# Patient Record
Sex: Male | Born: 1945 | Race: White | Hispanic: No | Marital: Married | Smoking: Light tobacco smoker
Health system: Southern US, Community
[De-identification: ages and names within clinical notes are randomized; demographics above are authoritative.]

## PROBLEM LIST (undated history)

## (undated) DIAGNOSIS — N1330 Unspecified hydronephrosis: Secondary | ICD-10-CM

## (undated) DIAGNOSIS — M545 Low back pain, unspecified: Secondary | ICD-10-CM

## (undated) DIAGNOSIS — J329 Chronic sinusitis, unspecified: Secondary | ICD-10-CM

## (undated) DIAGNOSIS — S12500A Unspecified displaced fracture of sixth cervical vertebra, initial encounter for closed fracture: Secondary | ICD-10-CM

## (undated) DIAGNOSIS — F419 Anxiety disorder, unspecified: Secondary | ICD-10-CM

## (undated) DIAGNOSIS — D751 Secondary polycythemia: Secondary | ICD-10-CM

## (undated) DIAGNOSIS — I700 Atherosclerosis of aorta: Secondary | ICD-10-CM

## (undated) DIAGNOSIS — Z87442 Personal history of urinary calculi: Secondary | ICD-10-CM

## (undated) DIAGNOSIS — N400 Enlarged prostate without lower urinary tract symptoms: Secondary | ICD-10-CM

## (undated) DIAGNOSIS — K219 Gastro-esophageal reflux disease without esophagitis: Secondary | ICD-10-CM

## (undated) DIAGNOSIS — N183 Chronic kidney disease, stage 3 unspecified: Secondary | ICD-10-CM

## (undated) DIAGNOSIS — I10 Essential (primary) hypertension: Secondary | ICD-10-CM

## (undated) DIAGNOSIS — N529 Male erectile dysfunction, unspecified: Secondary | ICD-10-CM

## (undated) DIAGNOSIS — E669 Obesity, unspecified: Secondary | ICD-10-CM

## (undated) DIAGNOSIS — D131 Benign neoplasm of stomach: Secondary | ICD-10-CM

## (undated) DIAGNOSIS — J309 Allergic rhinitis, unspecified: Secondary | ICD-10-CM

## (undated) DIAGNOSIS — M75100 Unspecified rotator cuff tear or rupture of unspecified shoulder, not specified as traumatic: Secondary | ICD-10-CM

## (undated) DIAGNOSIS — E785 Hyperlipidemia, unspecified: Secondary | ICD-10-CM

## (undated) DIAGNOSIS — R42 Dizziness and giddiness: Secondary | ICD-10-CM

## (undated) DIAGNOSIS — K5790 Diverticulosis of intestine, part unspecified, without perforation or abscess without bleeding: Secondary | ICD-10-CM

## (undated) DIAGNOSIS — C801 Malignant (primary) neoplasm, unspecified: Secondary | ICD-10-CM

## (undated) DIAGNOSIS — M542 Cervicalgia: Secondary | ICD-10-CM

## (undated) DIAGNOSIS — D126 Benign neoplasm of colon, unspecified: Secondary | ICD-10-CM

## (undated) DIAGNOSIS — Z8619 Personal history of other infectious and parasitic diseases: Secondary | ICD-10-CM

## (undated) DIAGNOSIS — K4090 Unilateral inguinal hernia, without obstruction or gangrene, not specified as recurrent: Secondary | ICD-10-CM

## (undated) DIAGNOSIS — G4730 Sleep apnea, unspecified: Secondary | ICD-10-CM

## (undated) DIAGNOSIS — K469 Unspecified abdominal hernia without obstruction or gangrene: Secondary | ICD-10-CM

## (undated) DIAGNOSIS — K5792 Diverticulitis of intestine, part unspecified, without perforation or abscess without bleeding: Secondary | ICD-10-CM

## (undated) HISTORY — PX: POLYPECTOMY: SHX149

## (undated) HISTORY — PX: HERNIA REPAIR: SHX51

## (undated) HISTORY — DX: Low back pain: M54.5

## (undated) HISTORY — PX: KNEE SURGERY: SHX244

## (undated) HISTORY — DX: Malignant (primary) neoplasm, unspecified: C80.1

## (undated) HISTORY — DX: Hyperlipidemia, unspecified: E78.5

## (undated) HISTORY — DX: Benign neoplasm of colon, unspecified: D12.6

## (undated) HISTORY — DX: Low back pain, unspecified: M54.50

## (undated) HISTORY — DX: Personal history of other infectious and parasitic diseases: Z86.19

## (undated) HISTORY — DX: Essential (primary) hypertension: I10

## (undated) HISTORY — DX: Benign neoplasm of stomach: D13.1

## (undated) HISTORY — DX: Gastro-esophageal reflux disease without esophagitis: K21.9

## (undated) HISTORY — DX: Diverticulitis of intestine, part unspecified, without perforation or abscess without bleeding: K57.92

## (undated) HISTORY — DX: Unspecified rotator cuff tear or rupture of unspecified shoulder, not specified as traumatic: M75.100

## (undated) HISTORY — DX: Male erectile dysfunction, unspecified: N52.9

## (undated) HISTORY — DX: Benign prostatic hyperplasia without lower urinary tract symptoms: N40.0

## (undated) HISTORY — DX: Sleep apnea, unspecified: G47.30

## (undated) HISTORY — DX: Anxiety disorder, unspecified: F41.9

---

## 1998-03-22 DIAGNOSIS — D751 Secondary polycythemia: Secondary | ICD-10-CM

## 1998-03-22 DIAGNOSIS — C801 Malignant (primary) neoplasm, unspecified: Secondary | ICD-10-CM

## 1998-03-22 HISTORY — DX: Malignant (primary) neoplasm, unspecified: C80.1

## 1998-03-22 HISTORY — DX: Secondary polycythemia: D75.1

## 2005-05-24 ENCOUNTER — Ambulatory Visit: Payer: Self-pay | Admitting: Internal Medicine

## 2005-06-01 ENCOUNTER — Ambulatory Visit: Payer: Self-pay | Admitting: Gastroenterology

## 2005-06-02 ENCOUNTER — Encounter: Admission: RE | Admit: 2005-06-02 | Discharge: 2005-08-31 | Payer: Self-pay | Admitting: Internal Medicine

## 2005-06-08 ENCOUNTER — Ambulatory Visit: Payer: Self-pay | Admitting: Pulmonary Disease

## 2005-06-16 ENCOUNTER — Ambulatory Visit: Payer: Self-pay | Admitting: Gastroenterology

## 2005-06-16 HISTORY — PX: UPPER GI ENDOSCOPY: SHX6162

## 2005-07-22 ENCOUNTER — Ambulatory Visit: Payer: Self-pay | Admitting: Internal Medicine

## 2006-07-21 ENCOUNTER — Ambulatory Visit: Payer: Self-pay | Admitting: Internal Medicine

## 2006-07-21 LAB — CONVERTED CEMR LAB
ALT: 62 units/L — ABNORMAL HIGH (ref 0–40)
AST: 33 units/L (ref 0–37)
BUN: 16 mg/dL (ref 6–23)
Basophils Relative: 1.2 % — ABNORMAL HIGH (ref 0.0–1.0)
Bilirubin Urine: NEGATIVE
Calcium: 9.9 mg/dL (ref 8.4–10.5)
Chloride: 105 meq/L (ref 96–112)
Eosinophils Relative: 1.3 % (ref 0.0–5.0)
HCT: 50.8 % (ref 39.0–52.0)
HDL: 41.3 mg/dL (ref >39.0)
Ketones, ur: NEGATIVE mg/dL
MCV: 90.4 fL (ref 78.0–100.0)
Monocytes Absolute: 0.9 10*3/uL — ABNORMAL HIGH (ref 0.2–0.7)
Monocytes Relative: 9.4 % (ref 3.0–11.0)
Neutro Abs: 5.5 10*3/uL (ref 1.4–7.7)
Neutrophils Relative %: 59.9 % (ref 43.0–77.0)
PSA: 0.61 ng/mL (ref 0.10–4.00)
Potassium: 4.3 meq/L (ref 3.5–5.1)
RBC: 5.62 M/uL (ref 4.22–5.81)
RDW: 12.1 % (ref 11.5–14.6)
TSH: 1.8 microintl units/mL (ref 0.35–5.50)
Total Bilirubin: 1 mg/dL (ref 0.3–1.2)
Total CHOL/HDL Ratio: 4.3
Triglycerides: 230 mg/dL (ref 0–149)
Urine Glucose: NEGATIVE mg/dL
Urobilinogen, UA: 0.2 (ref 0.0–1.0)
WBC: 9.2 10*3/uL (ref 4.5–10.5)
pH: 6 (ref 5.0–8.0)

## 2006-11-17 ENCOUNTER — Encounter: Payer: Self-pay | Admitting: *Deleted

## 2006-11-17 DIAGNOSIS — G43909 Migraine, unspecified, not intractable, without status migrainosus: Secondary | ICD-10-CM | POA: Insufficient documentation

## 2006-11-17 DIAGNOSIS — G4733 Obstructive sleep apnea (adult) (pediatric): Secondary | ICD-10-CM

## 2006-11-17 DIAGNOSIS — I10 Essential (primary) hypertension: Secondary | ICD-10-CM | POA: Insufficient documentation

## 2006-11-17 DIAGNOSIS — F528 Other sexual dysfunction not due to a substance or known physiological condition: Secondary | ICD-10-CM

## 2006-11-17 DIAGNOSIS — K219 Gastro-esophageal reflux disease without esophagitis: Secondary | ICD-10-CM

## 2006-11-17 DIAGNOSIS — E7800 Pure hypercholesterolemia, unspecified: Secondary | ICD-10-CM

## 2006-11-17 DIAGNOSIS — Z8719 Personal history of other diseases of the digestive system: Secondary | ICD-10-CM

## 2006-11-26 DIAGNOSIS — E785 Hyperlipidemia, unspecified: Secondary | ICD-10-CM

## 2006-11-26 DIAGNOSIS — M545 Low back pain: Secondary | ICD-10-CM | POA: Insufficient documentation

## 2007-05-01 ENCOUNTER — Encounter: Payer: Self-pay | Admitting: Internal Medicine

## 2008-04-11 ENCOUNTER — Telehealth (INDEPENDENT_AMBULATORY_CARE_PROVIDER_SITE_OTHER): Payer: Self-pay | Admitting: *Deleted

## 2008-04-12 ENCOUNTER — Ambulatory Visit: Payer: Self-pay | Admitting: Internal Medicine

## 2008-04-12 DIAGNOSIS — Z860100 Personal history of colon polyps, unspecified: Secondary | ICD-10-CM | POA: Insufficient documentation

## 2008-04-12 DIAGNOSIS — F411 Generalized anxiety disorder: Secondary | ICD-10-CM | POA: Insufficient documentation

## 2008-04-12 DIAGNOSIS — J019 Acute sinusitis, unspecified: Secondary | ICD-10-CM

## 2008-04-12 DIAGNOSIS — N401 Enlarged prostate with lower urinary tract symptoms: Secondary | ICD-10-CM | POA: Insufficient documentation

## 2008-04-12 DIAGNOSIS — G43009 Migraine without aura, not intractable, without status migrainosus: Secondary | ICD-10-CM | POA: Insufficient documentation

## 2008-04-12 DIAGNOSIS — A6000 Herpesviral infection of urogenital system, unspecified: Secondary | ICD-10-CM | POA: Insufficient documentation

## 2008-04-12 DIAGNOSIS — N400 Enlarged prostate without lower urinary tract symptoms: Secondary | ICD-10-CM

## 2008-04-12 LAB — CONVERTED CEMR LAB
AST: 32 units/L (ref 0–37)
Albumin: 3.8 g/dL (ref 3.5–5.2)
BUN: 17 mg/dL (ref 6–23)
Basophils Absolute: 0 10*3/uL (ref 0.0–0.1)
CO2: 28 meq/L (ref 19–32)
Cholesterol: 206 mg/dL (ref 0–200)
Eosinophils Absolute: 0.1 10*3/uL (ref 0.0–0.7)
MCHC: 34.4 g/dL (ref 30.0–36.0)
Monocytes Relative: 8.5 % (ref 3.0–12.0)
Neutro Abs: 3.4 10*3/uL (ref 1.4–7.7)
Neutrophils Relative %: 50.4 % (ref 43.0–77.0)
Nitrite: NEGATIVE
Platelets: 294 10*3/uL (ref 150–400)
RBC: 5.22 M/uL (ref 4.22–5.81)
Sodium: 140 meq/L (ref 135–145)
Total Bilirubin: 1.1 mg/dL (ref 0.3–1.2)
Urine Glucose: NEGATIVE mg/dL
pH: 5.5 (ref 5.0–8.0)

## 2008-04-25 ENCOUNTER — Ambulatory Visit: Payer: Self-pay | Admitting: Pulmonary Disease

## 2008-05-20 DIAGNOSIS — D126 Benign neoplasm of colon, unspecified: Secondary | ICD-10-CM

## 2008-05-20 HISTORY — DX: Benign neoplasm of colon, unspecified: D12.6

## 2008-06-06 ENCOUNTER — Ambulatory Visit: Payer: Self-pay | Admitting: Gastroenterology

## 2008-06-13 ENCOUNTER — Ambulatory Visit: Payer: Self-pay | Admitting: Gastroenterology

## 2008-06-13 ENCOUNTER — Encounter: Payer: Self-pay | Admitting: Gastroenterology

## 2008-06-23 ENCOUNTER — Encounter: Payer: Self-pay | Admitting: Gastroenterology

## 2008-10-11 ENCOUNTER — Ambulatory Visit (HOSPITAL_BASED_OUTPATIENT_CLINIC_OR_DEPARTMENT_OTHER): Admission: RE | Admit: 2008-10-11 | Discharge: 2008-10-11 | Payer: Self-pay | Admitting: Pulmonary Disease

## 2008-10-11 ENCOUNTER — Encounter: Payer: Self-pay | Admitting: Pulmonary Disease

## 2008-10-18 ENCOUNTER — Ambulatory Visit: Payer: Self-pay | Admitting: Pulmonary Disease

## 2008-11-05 ENCOUNTER — Ambulatory Visit: Payer: Self-pay | Admitting: Pulmonary Disease

## 2008-11-06 ENCOUNTER — Telehealth: Payer: Self-pay | Admitting: Pulmonary Disease

## 2009-01-22 ENCOUNTER — Encounter: Payer: Self-pay | Admitting: Pulmonary Disease

## 2009-02-04 ENCOUNTER — Ambulatory Visit: Payer: Self-pay | Admitting: Pulmonary Disease

## 2009-07-22 ENCOUNTER — Ambulatory Visit: Payer: Self-pay | Admitting: Internal Medicine

## 2009-07-22 DIAGNOSIS — R079 Chest pain, unspecified: Secondary | ICD-10-CM | POA: Insufficient documentation

## 2009-07-23 LAB — CONVERTED CEMR LAB
ALT: 25 units/L (ref 0–53)
CO2: 31 meq/L (ref 19–32)
Calcium: 9.2 mg/dL (ref 8.4–10.5)
Chloride: 104 meq/L (ref 96–112)
Eosinophils Absolute: 0.4 10*3/uL (ref 0.0–0.7)
Eosinophils Relative: 4.8 % (ref 0.0–5.0)
HCT: 46.1 % (ref 39.0–52.0)
Hemoglobin, Urine: NEGATIVE
Hemoglobin: 15.8 g/dL (ref 13.0–17.0)
Ketones, ur: NEGATIVE mg/dL
Leukocytes, UA: NEGATIVE
Lymphs Abs: 2.9 10*3/uL (ref 0.7–4.0)
MCV: 91.2 fL (ref 78.0–100.0)
Monocytes Absolute: 0.7 10*3/uL (ref 0.1–1.0)
Monocytes Relative: 8.4 % (ref 3.0–12.0)
Neutro Abs: 4.1 10*3/uL (ref 1.4–7.7)
Nitrite: NEGATIVE
Platelets: 241 10*3/uL (ref 150.0–400.0)
Potassium: 4.6 meq/L (ref 3.5–5.1)
RDW: 13.1 % (ref 11.5–14.6)
Total Protein, Urine: NEGATIVE mg/dL
Total Protein: 6.7 g/dL (ref 6.0–8.3)
Urine Glucose: NEGATIVE mg/dL
Urobilinogen, UA: 0.2 (ref 0.0–1.0)
WBC: 8 10*3/uL (ref 4.5–10.5)
pH: 6 (ref 5.0–8.0)

## 2010-01-08 ENCOUNTER — Encounter: Payer: Self-pay | Admitting: Internal Medicine

## 2010-04-20 ENCOUNTER — Ambulatory Visit (INDEPENDENT_AMBULATORY_CARE_PROVIDER_SITE_OTHER)
Admission: RE | Admit: 2010-04-20 | Discharge: 2010-04-20 | Payer: Self-pay | Source: Home / Self Care | Attending: Internal Medicine | Admitting: Internal Medicine

## 2010-04-20 DIAGNOSIS — F411 Generalized anxiety disorder: Secondary | ICD-10-CM

## 2010-04-20 DIAGNOSIS — I10 Essential (primary) hypertension: Secondary | ICD-10-CM

## 2010-04-20 DIAGNOSIS — IMO0002 Reserved for concepts with insufficient information to code with codable children: Secondary | ICD-10-CM

## 2010-04-21 NOTE — Letter (Signed)
Summary: [facility]   Imported By: Lennie Odor 02/09/2010 16:02:51  _____________________________________________________________________  External Attachment:    Type:   Image     Comment:   External Document

## 2010-04-21 NOTE — Assessment & Plan Note (Signed)
Summary: pain over right kidney/cd   Vital Signs:  Patient profile:   65 year old male Height:      76 inches Weight:      297.75 pounds BMI:     36.37 O2 Sat:      96 % on Room air Temp:     98.1 degrees F oral Pulse rate:   54 / minute BP sitting:   118 / 62  (left arm) Cuff size:   large  Vitals Entered ByZella Ball Ewing (Jul 22, 2009 2:50 PM)  O2 Flow:  Room air  CC: Pain over Right Kidney for 2 months/RE   Primary Care Provider:  Oliver Barre, MD  CC:  Pain over Right Kidney for 2 months/RE.  History of Present Illness: here for f/u - moved in to new house 2 mo ago, lots of lifting, more stress and admits to worse diet with sweets;  pain to the right lower back mild to mo, better with lower dose advil, but doesnot always help the pain,a nd also doing more work on the house that aggrevates the pain with a project such as painting the walls in the house every wk recently; lots of up and down ladders as well;  Pt denies CP, sob, doe, wheezing, orthopnea, pnd, worsening LE edema, palps, dizziness or syncope  Pt denies new neuro symptoms such as headache, facial or extremity weakness  Also wtih more stress over workload - now has "old job and the new job" responsibliies';  pain worse to roll on the right side, not sleeping well; got to bed at 2 am, but got up again at 4 am due to pain;  hard to tolerate the CPAP and oftern wakes up with the CPAP taken off time in the night.  Worried he has a broken rib iwth the pain persisting.  No bowel or bladder change,  no LE pain, weaknes, numb, GI or GU changes, wt loss, night sweats.    Problems Prior to Update: 1)  Benign Prostatic Hypertrophy  (ICD-600.00) 2)  Migraine, Common  (ICD-346.10) 3)  Genital Herpes  (ICD-054.10) 4)  Anxiety  (ICD-300.00) 5)  Sinusitis- Acute-nos  (ICD-461.9) 6)  Colonic Polyps, Hx of  (ICD-V12.72) 7)  Preventive Health Care  (ICD-V70.0) 8)  Low Back Pain  (ICD-724.2) 9)  Erectile Dysfunction  (ICD-302.72) 10)   Hyperlipidemia  (ICD-272.4) 11)  Migraine Headache  (ICD-346.90) 12)  Sleep Apnea  (ICD-780.57) 13)  Hypercholesterolemia  (ICD-272.0) 14)  Hypertension  (ICD-401.9) 15)  Gerd  (ICD-530.81) 16)  Diverticulitis, Hx of  (ICD-V12.79)  Medications Prior to Update: 1)  Crestor 20 Mg Tabs (Rosuvastatin Calcium) .Marland Kitchen.. 1po Once Daily 2)  Metoprolol Tartrate 50 Mg Tabs (Metoprolol Tartrate) .Marland Kitchen.. 1 By Mouth Two Times A Day 3)  Adult Aspirin Ec Low Strength 81 Mg Tbec (Aspirin) .Marland Kitchen.. 1po Once Daily 4)  Autocpap 8-15  Current Medications (verified): 1)  Crestor 20 Mg Tabs (Rosuvastatin Calcium) .Marland Kitchen.. 1po Once Daily 2)  Metoprolol Tartrate 50 Mg Tabs (Metoprolol Tartrate) .Marland Kitchen.. 1 By Mouth Two Times A Day 3)  Adult Aspirin Ec Low Strength 81 Mg Tbec (Aspirin) .Marland Kitchen.. 1po Once Daily 4)  Autocpap 8-15  Allergies (verified): No Known Drug Allergies  Past History:  Past Medical History: Last updated: 04/12/2008 Diverticulitis, hx of GERD Hypertension Sleep Apnea - CPAP Migraine Headaches Hyperlipidemia Erectile Dysfunction Low back pain Colonic polyps, hx of partial left rotater cuff tear Anxiety migraine hx of genital herpes Benign prostatic hypertrophy  Past Surgical History: Last updated: 04/12/2008 Hernia repair - umbilical s/p skin cancer hx of fx of c-spine  Family History: Last updated: 04/12/2008 HTN  Social History: Last updated: 04/25/2008 work - Corporate treasurer - Psychologist, educational Married 1 child Current Smoker - Corporate investment banker Alcohol use-yes - occ social  Risk Factors: Smoking Status: current (04/12/2008)  Review of Systems  The patient denies anorexia, fever, weight loss, weight gain, vision loss, decreased hearing, hoarseness, chest pain, syncope, dyspnea on exertion, peripheral edema, prolonged cough, headaches, hemoptysis, abdominal pain, melena, hematochezia, severe indigestion/heartburn, hematuria, muscle weakness, suspicious skin lesions, transient blindness,  difficulty walking, depression, abnormal bleeding, enlarged lymph nodes, angioedema, and breast masses.         all otherwise negative per pt -    Physical Exam  General:  alert and overweight-appearing.   Head:  normocephalic and atraumatic.   Eyes:  vision grossly intact, pupils equal, and pupils round.   Ears:  R ear normal and L ear normal.   Nose:  no external deformity and no nasal discharge.   Mouth:  no gingival abnormalities and pharynx pink and moist.   Neck:  supple and no masses.   Lungs:  normal respiratory effort and normal breath sounds.   Heart:  normal rate and regular rhythm.   Abdomen:  soft, non-tender, and normal bowel sounds.   Msk:  no joint tenderness and no joint swelling.  , has mild right lumbar paravertebral tedner extending to right lower rib/flank area Extremities:  no edema, no erythema  Neurologic:  cranial nerves II-XII intact and strength normal in all extremities.   Skin:  color normal and no rashes.   Psych:  not depressed appearing and moderately anxious.      Impression & Recommendations:  Problem # 1:  Preventive Health Care (ICD-V70.0) Overall doing well, age appropriate education and counseling updated and referral for appropriate preventive services done unless declined, immunizations up to date or declined, diet counseling done if overweight, urged to quit smoking if smokes , most recent labs reviewed and current ordered if appropriate, ecg reviewed or declined (interpretation per ECG scanned in the EMR if done); information regarding Medicare Prevention requirements given if appropriate  Orders: TLB-BMP (Basic Metabolic Panel-BMET) (80048-METABOL) TLB-CBC Platelet - w/Differential (85025-CBCD) TLB-Hepatic/Liver Function Pnl (80076-HEPATIC) TLB-Lipid Panel (80061-LIPID) TLB-TSH (Thyroid Stimulating Hormone) (84443-TSH) TLB-PSA (Prostate Specific Antigen) (84153-PSA) TLB-Udip ONLY (81003-UDIP)  Problem # 2:  CHEST PAIN  (ICD-786.50)  right side and flank - suspect msk, to check films  Orders: T-2 View CXR, Same Day (71020.5TC) T-Ribs Unilateral 2 Views (71100TC)  Problem # 3:  HYPERTENSION (ICD-401.9)  His updated medication list for this problem includes:    Metoprolol Tartrate 50 Mg Tabs (Metoprolol tartrate) .Marland Kitchen... 1 by mouth two times a day  BP today: 118/62 Prior BP: 142/74 (02/04/2009)  Labs Reviewed: K+: 4.1 (04/12/2008) Creat: : 1.2 (04/12/2008)   Chol: 206 (04/12/2008)   HDL: 33.7 (04/12/2008)   LDL: DEL (04/12/2008)   TG: 161 (04/12/2008) stable overall by hx and exam, ok to continue meds/tx as is   Complete Medication List: 1)  Crestor 20 Mg Tabs (Rosuvastatin calcium) .Marland Kitchen.. 1po once daily 2)  Metoprolol Tartrate 50 Mg Tabs (Metoprolol tartrate) .Marland Kitchen.. 1 by mouth two times a day 3)  Adult Aspirin Ec Low Strength 81 Mg Tbec (Aspirin) .Marland Kitchen.. 1po once daily 4)  Autocpap 8-15   Patient Instructions: 1)  Please go to Radiology in the basement level for your X-Ray today  2)  Please go to the Lab in the basement for your blood and/or urine tests today 3)  Continue all previous medications as before this visit  4)  Please schedule a follow-up appointment in 1 year or sooner if needed Prescriptions: METOPROLOL TARTRATE 50 MG TABS (METOPROLOL TARTRATE) 1 by mouth two times a day  #180 x 3   Entered and Authorized by:   Corwin Levins MD   Signed by:   Corwin Levins MD on 07/22/2009   Method used:   Print then Give to Patient   RxID:   [id number] CRESTOR 20 MG TABS (ROSUVASTATIN CALCIUM) 1po once daily  #90 x 3   Entered and Authorized by:   Corwin Levins MD   Signed by:   Corwin Levins MD on 07/22/2009   Method used:   Print then Give to Patient   RxID:   [id number]

## 2010-05-07 NOTE — Assessment & Plan Note (Signed)
Summary: f/u appt/#/cd   Vital Signs:  Patient profile:   65 year old male Height:      76 inches Weight:      302.25 pounds BMI:     36.92 O2 Sat:      95 % on Room air Temp:     98.2 degrees F oral Pulse rate:   53 / minute BP sitting:   100 / 62  (left arm) Cuff size:   large  Vitals Entered By: Zella Ball Ewing CMA Duncan Dull) (April 20, 2010 8:54 AM)  O2 Flow:  Room air  CC: followup/RE   Primary Care Provider:  Oliver Barre, MD  CC:  followup/RE.  History of Present Illness: here for f/u; considering lap band surgury, and needs supportive letter with wts;  has had persistent morbid obesit depsite working with personal trainer for 3 yrs and trying to folllow lower wt watchers, then [employer], but no signficant wt loss able to be effective to date;  course unfortunatley complicated by torn meniscus to right knee s/p arthroscopy jan 2011, and forced to be somwhat less acitve due to that; has known sleep apnea, tolerated CPAP and working well with less daytime somnolence (but still some some sleepy) and also hx of HTN, and elev lipids, and reflux likely aggrevated by wt (but good compliance with med, no recent dysphagia, n/v, abd pain, blood).  . Mother had similar health condition and overwt, finally succombing to colon cancer in later yrs.   Tends to sweat profusely with any excercise, but Pt denies CP, worsening sob, doe, wheezing, orthopnea, pnd, worsening LE edema, palps, dizziness or syncope  Pt denies new neuro symptoms such as headache, facial or extremity weakness  Pt denies polydipsia, polyuria   Overall good compliance with meds, trying to follow low chol  diet, wt stable, little excercise however   No fever, wt loss, night sweats, loss of appetite or other constitutional symptoms  Overall good compliance with meds, and good tolerability.  Denies worsening depressive symptoms, suicidal ideation, or panic, though has had ongoing anxiety, but pt states tolerable, delcines need for  counseling or further tx today.   Does incidently have pain, redness to left mid arm after a stumbled yesterday, still sore, but no swelling, d/c or fever.    Preventive Screening-Counseling & Management      Drug Use:  no.    Problems Prior to Update: 1)  Morbid Obesity  (ICD-278.01) 2)  Abrasion, Arm  (ICD-919.0) 3)  Preventive Health Care  (ICD-V70.0) 4)  Chest Pain  (ICD-786.50) 5)  Benign Prostatic Hypertrophy  (ICD-600.00) 6)  Migraine, Common  (ICD-346.10) 7)  Genital Herpes  (ICD-054.10) 8)  Anxiety  (ICD-300.00) 9)  Sinusitis- Acute-nos  (ICD-461.9) 10)  Colonic Polyps, Hx of  (ICD-V12.72) 11)  Preventive Health Care  (ICD-V70.0) 12)  Low Back Pain  (ICD-724.2) 13)  Erectile Dysfunction  (ICD-302.72) 14)  Hyperlipidemia  (ICD-272.4) 15)  Migraine Headache  (ICD-346.90) 16)  Sleep Apnea  (ICD-780.57) 17)  Hypercholesterolemia  (ICD-272.0) 18)  Hypertension  (ICD-401.9) 19)  Gerd  (ICD-530.81) 20)  Diverticulitis, Hx of  (ICD-V12.79)  Medications Prior to Update: 1)  Crestor 20 Mg Tabs (Rosuvastatin Calcium) .Marland Kitchen.. 1po Once Daily 2)  Metoprolol Tartrate 50 Mg Tabs (Metoprolol Tartrate) .Marland Kitchen.. 1 By Mouth Two Times A Day 3)  Adult Aspirin Ec Low Strength 81 Mg Tbec (Aspirin) .Marland Kitchen.. 1po Once Daily 4)  Autocpap 8-15  Current Medications (verified): 1)  Crestor 20 Mg Tabs (Rosuvastatin  Calcium) .Marland Kitchen.. 1po Once Daily 2)  Metoprolol Tartrate 50 Mg Tabs (Metoprolol Tartrate) .Marland Kitchen.. 1 By Mouth Two Times A Day 3)  Adult Aspirin Ec Low Strength 81 Mg Tbec (Aspirin) .Marland Kitchen.. 1po Once Daily 4)  Autocpap 8-15  Allergies (verified): No Known Drug Allergies  Past History:  Past Medical History: Last updated: 04/12/2008 Diverticulitis, hx of GERD Hypertension Sleep Apnea - CPAP Migraine Headaches Hyperlipidemia Erectile Dysfunction Low back pain Colonic polyps, hx of partial left rotater cuff tear Anxiety migraine hx of genital herpes Benign prostatic hypertrophy  Past Surgical  History: Last updated: 04/12/2008 Hernia repair - umbilical s/p skin cancer hx of fx of c-spine  Social History: Last updated: 04/20/2010 work - Airline pilot and marketing - Psychologist, educational Married 1 child Current Smoker - occ ciagr Alcohol use-yes - occ social Drug use-no  Risk Factors: Smoking Status: current (04/12/2008)  Family History: Reviewed history from 04/12/2008 and no changes required. mother with colon cancer  Social History: Reviewed history from 04/25/2008 and no changes required. 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   Head:  normocephalic and atraumatic.   Eyes:  vision grossly intact, pupils equal, and pupils round.   Ears:  R ear normal and L ear normal.   Nose:  no external deformity and no nasal discharge.   Mouth:  no gingival abnormalities and pharynx pink and moist.   Neck:  supple and no masses.   Lungs:  normal respiratory effort and normal breath sounds.   Heart:  normal rate and regular rhythm.   Abdomen:  soft, non-tender, and normal bowel sounds.   Msk:  no joint tenderness and no joint swelling.   Extremities:  no edema, no erythema  Neurologic:  strength normal in all extremities and gait normal.   Skin:  color normal and no rashes.  , does have 1x1 cm superficial abrasion left mid lateral arm, without significant other swelling, d/c, or abscess Psych:  not depressed appearing and slightly anxious.     Impression & Recommendations:  Problem # 1:  ABRASION, ARM (ICD-919.0) left mid arm, no sign of infection, ok for neosporin, guaze and follow as needed   Problem # 2:  MORBID OBESITY (ICD-278.01) pt desires letter of medical necessity, which I think if appropriate and this will be forwarded  Problem # 3:  HYPERTENSION  (ICD-401.9)  His updated medication list for this problem includes:    Metoprolol Tartrate 50 Mg Tabs (Metoprolol tartrate) .Marland Kitchen... 1 by mouth two times a day  BP today: 100/62 Prior BP: 118/62 (07/22/2009)  Labs Reviewed: K+: 4.6 (07/22/2009) Creat: : 1.2 (07/22/2009)   Chol: 127 (07/22/2009)   HDL: 34.00 (07/22/2009)   LDL: 56 (07/22/2009)   TG: 184.0 (07/22/2009) stable overall by hx and exam, ok to continue meds/tx as is   Problem # 4:  ANXIETY (ICD-300.00) stable overall by hx and exam, ok to continue meds/tx as is  - declines counseling or SSRI trial  Complete Medication List: 1)  Crestor 20 Mg Tabs (Rosuvastatin calcium) .Marland Kitchen.. 1po once daily 2)  Metoprolol Tartrate 50 Mg Tabs (Metoprolol tartrate) .Marland Kitchen.. 1 by mouth two times a day 3)  Adult Aspirin Ec Low Strength 81 Mg Tbec (Aspirin) .Marland Kitchen.. 1po once  daily 4)  Autocpap 8-15   Patient Instructions: 1)  Continue all previous medications as before this visit 2)  The letter you requested will be sent to Dr Andrey Campanile 3)  Please continue to monitor your arm for sign of infection including fever, increased pain, redness or swelling 4)  Please schedule a follow-up appointment in 4 months/May 2012 for CPX with labs   Orders Added: 1)  Est. Patient Level IV [04540]

## 2010-05-14 ENCOUNTER — Encounter: Payer: Self-pay | Admitting: Internal Medicine

## 2010-05-15 ENCOUNTER — Encounter: Payer: Self-pay | Admitting: Internal Medicine

## 2010-05-19 NOTE — Letter (Signed)
Summary: Generic Letter  [facility]  [address] [place], [place] [address]   Phone: [phone]  Fax: [phone]    05/14/2010  Douglas Dieppa [address] [place], [place]  [address]  Dear Mr. [facility],       This letter is forwarded to you in support of your  ongoing weight loss efforts, now culminating in evaluation  for Lap Band procedure.  You have been treated for over   three years, and during this time your weight has   unfortunately resisted all attempts at reduction by   increased excercise and reduction in calories.  I include  office visits for the past three years to support this, and  it should be noted you have multiple risk factors for   complications in the future such as stroke or heart disease  that would be signficantly improved by this procedure.  These  conditions include Obstructive Sleep Apnea, Hypertension,  Hyperlipidemia, Gastroesophageal Reflux, and Chronic Lower   Back Pain.          I would enthusiastically support your request for Lap   Band procedure, as this will mark a major milestone in   improvement of current heart disease and stroke risk factors,  quality of life, and risk of death.        Sincerely,   Oliver Barre MD

## 2010-05-28 ENCOUNTER — Telehealth: Payer: Self-pay | Admitting: Internal Medicine

## 2010-05-28 NOTE — Letter (Signed)
Summary: Request medical records & letter/[facility]  Request medical records & letter/[facility]   Imported By: Sherian Rein 05/19/2010 10:17:50  _____________________________________________________________________  External Attachment:    Type:   Image     Comment:   External Document

## 2010-05-29 ENCOUNTER — Institutional Professional Consult (permissible substitution) (INDEPENDENT_AMBULATORY_CARE_PROVIDER_SITE_OTHER): Payer: Managed Care, Other (non HMO) | Admitting: Cardiology

## 2010-05-29 ENCOUNTER — Encounter: Payer: Self-pay | Admitting: Cardiology

## 2010-05-29 DIAGNOSIS — I10 Essential (primary) hypertension: Secondary | ICD-10-CM

## 2010-05-29 DIAGNOSIS — R072 Precordial pain: Secondary | ICD-10-CM

## 2010-05-29 DIAGNOSIS — E669 Obesity, unspecified: Secondary | ICD-10-CM

## 2010-06-01 ENCOUNTER — Institutional Professional Consult (permissible substitution): Payer: Self-pay | Admitting: Cardiology

## 2010-06-02 NOTE — Assessment & Plan Note (Signed)
Summary: consult; chestpain. per mary from [facility]. notes in emr. ...      Allergies Added: NKDA  Visit Type:  Initial Consult Primary Provider:  Oliver Barre, MD  CC:  chest pain.  History of Present Illness: The patient presents for evaluation of chest discomfort.  He has no prior cardiac history. He has had no prior testing.  He reports chest discomfort x about one month. He reports a sharp discomfort and points to a localized area left of the sternum.  This is somewhat sharp. It may last for a couple of minutes. It happened sporadically and is not reproduced with activity. He does not recall having this before. There is no associated chest pressure, neck or arm discomfort. He does not report palpitations, presyncope or syncope. It may have been once or twice per day but he thinks it is happening with more frequency though the same intensity. He does get some mild shortness of breath with activity but denies any resting shortness of breath, PND or orthopnea. He has had no prior cardiac workup.  Current Medications (verified): 1)  Crestor 20 Mg Tabs (Rosuvastatin Calcium) .Marland Kitchen.. 1po Once Daily 2)  Metoprolol Tartrate 50 Mg Tabs (Metoprolol Tartrate) .Marland Kitchen.. 1 By Mouth Two Times A Day 3)  Adult Aspirin Ec Low Strength 81 Mg Tbec (Aspirin) .Marland Kitchen.. 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   Vital Signs:  Patient profile:   65 year old male Height:      76 inches Weight:      307 pounds BMI:     37.50 Pulse rate:   83 / minute Resp:     16 per minute BP sitting:   142 / 75  (right arm)  Vitals Entered By: Marrion Coy, CNA (May 29, 2010 12:58 PM)  Physical Exam  General:  Well developed, well nourished, in no acute distress. Head:  normocephalic and atraumatic Eyes:  PERRLA/EOM intact; conjunctiva and lids normal. Neck:  Neck supple, no JVD. No masses, thyromegaly or abnormal cervical nodes. Chest Wall:  no deformities or breast masses noted Lungs:  Clear bilaterally to auscultation and percussion. Abdomen:  Bowel sounds positive; abdomen soft and non-tender without masses, organomegaly, or hernias noted. No hepatosplenomegaly, obese Msk:  Back normal, normal gait. Muscle strength and tone normal. Extremities:  No clubbing or cyanosis. Neurologic:  Alert and oriented x 3. Skin:  Intact without lesions or rashes. Cervical Nodes:  no significant adenopathy Inguinal Nodes:  no significant adenopathy Psych:  Normal affect.   Detailed Cardiovascular Exam  Neck    Carotids: Carotids full and equal bilaterally without bruits.      Neck Veins: Normal, no JVD.    Heart    Inspection: no deformities or lifts noted.  Palpation: normal PMI with no thrills palpable.      Auscultation: regular rate and rhythm, S1, S2 without murmurs, rubs, gallops, or clicks.    Vascular    Abdominal Aorta: no palpable masses, pulsations, or audible bruits.      Femoral Pulses: normal femoral pulses bilaterally.      Pedal Pulses: normal pedal pulses bilaterally.      Radial Pulses: normal radial pulses bilaterally.      Peripheral Circulation: no clubbing, cyanosis, or edema noted with normal capillary refill.     EKG  Procedure date:   05/29/2010  Findings:      Sinus rhythm, rate 70, axis within normal limits, intervals within normal limits, no acute ST-T wave changes  Impression & Recommendations:  Problem # 1:  CHEST PAIN (ICD-786.50) His chest pain is atypical more than typical.  I think that the pretest probability of CAD is low.  However, he does have risk factors.  He will be scheduled for an ETT. Orders: Treadmill (Treadmill)  Problem # 2:  MORBID OBESITY (ICD-278.01) We had a long discussion about this.  I suggested the [employer].  Problem # 3:  HYPERLIPIDEMIA (ICD-272.4) I reviewed lipid profile. The LDL was 56. HDL 34. We discussed increasing the HDL exercise. He will otherwise continue meds as listed  Problem # 4:  HYPERTENSION (ICD-401.9) His blood pressure is slightly elevated. We did discuss therapeutic lifestyle changes. For now he will continue meds as listed.  Orders: Treadmill (Treadmill)  Patient Instructions: 1)  Your physician recommends that you schedule a follow-up appointment in: as needed with Dr. Antoine Poche 2)  Your physician recommends that you continue on your current medications as directed. Please refer to the Current Medication list given to you today. 3)  Your physician has requested that you have an exercise tolerance test.  For further information please visit [email or web address]  Please also follow instruction sheet, as given.

## 2010-06-02 NOTE — Progress Notes (Signed)
Summary: intermittent CP?  Phone Note Call from Patient Call back at [facility] Phone [phone] Call back at Work Phone [phone]   Caller: Patient VM OK on home number Summary of Call: Pt called stating he has been having intermitent CP for several weeks, LT side, sharp, SOB (allergies?), numbness of fingers. No dizziness or N&V.  Pt is requesting referral to Cardiology. Pt does not believe sxs are emergent but is requesting referral ASAP. Initial call taken by: Margaret Pyle, CMA,  May 28, 2010 2:11 PM  Follow-up for Phone Call        ok = will do Follow-up by: Corwin Levins MD,  May 28, 2010 3:42 PM  Additional Follow-up for Phone Call Additional follow up Details #1::        Pt advised and will expect call from [facility] with appt info Additional Follow-up by: Margaret Pyle, CMA,  May 28, 2010 3:57 PM

## 2010-06-15 ENCOUNTER — Encounter (HOSPITAL_COMMUNITY): Payer: Self-pay

## 2010-07-14 ENCOUNTER — Encounter: Payer: Self-pay | Admitting: Cardiology

## 2010-07-15 ENCOUNTER — Ambulatory Visit (INDEPENDENT_AMBULATORY_CARE_PROVIDER_SITE_OTHER): Payer: Managed Care, Other (non HMO) | Admitting: Cardiology

## 2010-07-15 DIAGNOSIS — R079 Chest pain, unspecified: Secondary | ICD-10-CM

## 2010-07-15 NOTE — Progress Notes (Signed)
Exercise Treadmill Test  Pre-Exercise Testing Evaluation Rhythm: normal sinus  Rate: 60   PR:  .20 QRS:  .09  QT:  .40 QTc: .40     Test  Exercise Tolerance Test Ordering MD: Angelina Sheriff, MD  Interpreting MD:  Angelina Sheriff, MD  Unique Test No: 1  Treadmill:  1  Indication for ETT: chest pain - rule out ischemia  Contraindication to ETT: No   Stress Modality: exercise - treadmill  Cardiac Imaging Performed: non   Protocol: standard Bruce - maximal  Max BP:  172/68  Max MPHR (bpm):  158 85% MPR (bpm):  134  MPHR obtained (bpm):  115 % MPHR obtained:   Reached 85% MPHR (min:sec):  Total Exercise Time (min-sec): 6:00  Workload in METS:  7.0 Borg Scale: 16  Reason ETT Terminated:  dyspnea    ST Segment Analysis At Rest: normal ST segments - no evidence of significant ST depression With Exercise: non-specific ST changes  Other Information Arrhythmia:  No Angina during ETT:  absent (0) Quality of ETT:  non-diagnostic  ETT Interpretation:  borderline (indeterminate) with non-specific ST changes  Comments: The patient had a poor exercise tolerance.  There was no chest pain.  There was an increased level of dyspnea.  There were no arrhythmias, and normal BP response.  There were no ischemic ST T wave changes.   Recommendations: Inadequate ETT.  This was stopped because of excessive dyspnea.  He did not reach his target heart rate. I cannot exclude ischemia with this low level of exercise.  He will need a Lexiscan myoview. If this is negative I would suggest a pulmonary work up with Vassie Loll.

## 2010-07-15 NOTE — Patient Instructions (Signed)
You will be scheduled for Lexiscan stress test.  Please see your instruction sheet Continue medications as directed

## 2010-07-27 ENCOUNTER — Ambulatory Visit (HOSPITAL_COMMUNITY): Payer: Managed Care, Other (non HMO) | Attending: Cardiology | Admitting: Radiology

## 2010-07-27 DIAGNOSIS — R079 Chest pain, unspecified: Secondary | ICD-10-CM | POA: Insufficient documentation

## 2010-07-27 DIAGNOSIS — R0609 Other forms of dyspnea: Secondary | ICD-10-CM

## 2010-07-27 MED ORDER — TECHNETIUM TC 99M TETROFOSMIN IV KIT
33.0000 | PACK | Freq: Once | INTRAVENOUS | Status: AC | PRN
  Administered 2010-07-27: 33 via INTRAVENOUS

## 2010-07-27 MED ORDER — REGADENOSON 0.4 MG/5ML IV SOLN
0.4000 mg | Freq: Once | INTRAVENOUS | Status: AC
  Administered 2010-07-27: 0.4 mg via INTRAVENOUS

## 2010-07-27 MED ORDER — TECHNETIUM TC 99M TETROFOSMIN IV KIT
10.8000 | PACK | Freq: Once | INTRAVENOUS | Status: AC | PRN
  Administered 2010-07-27: 11 via INTRAVENOUS

## 2010-07-27 NOTE — Progress Notes (Signed)
[facility] SITE 3 NUCLEAR MED [address] [place] [address] [phone]  Cardiology Nuclear Med Study  Antonio Reyes is a 65 y.o. male [id number] 09-13-1945   Nuclear Med Background Indication for Stress Test:  Evaluation for Ischemia History:  07/15/10 EPP:[id number] due to submaximal heart rate. Cardiac Risk Factors: Hypertension, Lipids, Obesity and Smoker  Symptoms:  Chest Pain, DOE and Fatigue   Nuclear Pre-Procedure Caffeine/Decaff Intake:  None NPO After: 8:00pm   Lungs:  Clear.  O2 sat 95% on RA. IV 0.9% NS with Angio Cath:  22g  IV Site: R Hand  IV Started by:  Milana Na, EMT-P  Chest Size (in):  50 Cup Size: n/a  Height: 6\' 4"  (1.93 m)   Weight:  301 lb (136.533 kg)  BMI:  Body mass index is 36.64 kg/(m^2). Tech Comments:Metoprolol held x 48 hours.  [name], not twice a day as seen on file.      Nuclear Med Study 1 or 2 day study: 1 day  Stress Test Type:  Treadmill/Lexiscan  Reading MD: Charlton Haws, MD  Order Authorizing Provider:  Melany Guernsey  Resting Radionuclide: Technetium 77m Tetrofosmin  Resting Radionuclide Dose: 10.8 mCi   Stress Radionuclide:  Technetium 76m Tetrofosmin  Stress Radionuclide Dose: 333.0 mCi           Stress Protocol Rest HR: 55 Stress HR: 101  Rest BP: 123/78 Stress BP: 168/82  Exercise Time (min): 2:00 METS: n/a   Predicted Max HR: 158 bpm % Max HR: 63.92 bpm Rate Pressure Product: 55732   Dose of Adenosine (mg):  n/a Dose of Lexiscan: 0.4 mg  Dose of Atropine (mg): n/a Dose of Dobutamine: n/a mcg/kg/min (at max HR)  Stress Test Technologist: Smiley Houseman, CMA-N  Nuclear Technologist:  Domenic Polite, CNMT     Rest Procedure:  Myocardial perfusion imaging was performed at rest 45 minutes following the intravenous administration of Technetium 65m Tetrofosmin. Rest ECG: Nonspecific T-wave changes in lead III.  Stress  Procedure:  The patient received IV Lexiscan 0.4 mg over 15-seconds with concurrent low level exercise and then Technetium 56m Tetrofosmin was injected at 30-seconds while the patient continued walking one more minute.  There were no significant changes with Lexiscan.  Quantitative spect images were obtained after a 45-minute delay. Stress ECG: No significant change from baseline ECG  QPS Raw Data Images:  Normal; no motion artifact; normal heart/lung ratio. Stress Images:  Normal homogeneous uptake in all areas of the myocardium. Rest Images:  Normal homogeneous uptake in all areas of the myocardium. Subtraction (SDS):  Normal Transient Ischemic Dilatation (Normal <1.22):  0.99 Lung/Heart Ratio (Normal <0.45):  0.34  Quantitative Gated Spect Images QGS EDV:  113 ml QGS ESV:  39 ml QGS cine images:  NL LV Function; NL Wall Motion QGS EF: 66%  Impression Exercise Capacity:  Lexiscan with low level exercise. BP Response:  Normal blood pressure response. Clinical Symptoms:  No chest pain. ECG Impression:  No significant ST segment change suggestive of ischemia. Comparison with Prior Nuclear Study: No previous nuclear study performed  Overall Impression:  Normal stress nuclear study.   Marland Kitchen   Charlton Haws

## 2010-07-27 NOTE — Progress Notes (Deleted)
This encounter was created in error - please disregard.

## 2010-07-28 ENCOUNTER — Encounter (HOSPITAL_COMMUNITY): Payer: Managed Care, Other (non HMO) | Admitting: Radiology

## 2010-07-29 ENCOUNTER — Telehealth: Payer: Self-pay | Admitting: Cardiology

## 2010-07-29 NOTE — Telephone Encounter (Signed)
Pt aware of normal stress test results.

## 2010-07-29 NOTE — Telephone Encounter (Signed)
Pt wants results of stress test °

## 2010-07-29 NOTE — Telephone Encounter (Signed)
Pt aware of results of nuclear stress test

## 2010-08-04 ENCOUNTER — Other Ambulatory Visit: Payer: Self-pay

## 2010-08-04 MED ORDER — ROSUVASTATIN CALCIUM 20 MG PO TABS: 20.0000 mg | ORAL_TABLET | Freq: Every day | ORAL | Status: DC

## 2010-08-04 MED ORDER — METOPROLOL TARTRATE 50 MG PO TABS: 50.0000 mg | ORAL_TABLET | Freq: Two times a day (BID) | ORAL | Status: DC

## 2010-08-04 NOTE — Telephone Encounter (Signed)
Pt called requesting medication refill until appt scheduled 08/18/2010

## 2010-08-04 NOTE — Procedures (Signed)
NAME:  Antonio Reyes, Antonio Reyes.:  [id number]   MEDICAL RECORD NO.:  [id number]          PATIENT TYPE:  OUT   LOCATION:  SLEEP CENTER                 FACILITY[facility]  [facility]   PHYSICIAN:  Coralyn Helling, MD        DATE OF BIRTH:  07/21/1945   DATE OF STUDY:  10/11/2008                            NOCTURNAL POLYSOMNOGRAM   REFERRING PHYSICIAN:  Oretha Milch, MD   FACILITY:  [facility].   INDICATION FOR STUDY:  [name] is a 65 year old male who has a  history of hypertension and chronic headaches.  He had a overnight  polysomnogram in [place] and was diagnosed with obstructive sleep apnea.  He has been on CPAP.  He had recent weight gain and noticed a worsening  sleep disruption with excessive daytime sleepiness.  He is referred to  the Sleep Lab for a CPAP titration study.   Height is 6 feet 4 inches, weight is 294 pounds.  BMI is 36.  Neck size  is 18 inches.   EPWORTH SLEEPINESS SCORE:  4.   MEDICATIONS:  Crestor, metoprolol, aspirin, cephalexin.   SLEEP ARCHITECTURE:  Total recording time was 389 minutes.  Total sleep  time was 281 minutes.  Sleep efficiency was 72%.  Sleep latency was 15  minutes.  REM latency was 265 minutes.  The study was notable for lack  of slow wave sleep and a reduction in the percentage of REM sleep.  The  patient slept in both, the supine and nonsupine position.   RESPIRATORY DATA:  The average respiratory rate was 14.  The patient was  started on CPAP of 8 cm of water and initially titrated to CPAP of 18 cm  of water.  This portion of the study was done with the patient using a  full face mask.  However, he had difficulties tolerating a full face  mask, he was tried on several other different mask types and eventually  switched back to his home mask of a nasal pillows.  He was then started  again on CPAP of 12 cm of water which was titrated to CPAP of 15 cm  water.  At a pressure of 15 cm of water, he was observed in REM sleep  but nonsupine sleep and he continued to have apneic events with an apnea-  hypopnea index of 6.  Unfortunately, the test had to be concluded prior  to achieving adequate titration.   OXYGEN DATA:  The baseline oxygenation was 96%.  The oxygen saturation  nadir was 87%.  At a CPAP pressure setting of 15 cm of water, the oxygen  saturation nadir was 94%.   CARDIAC DATA:  The average heart rate was 56 and the rhythm strip showed  normal sinus rhythm.   MOVEMENT-PARASOMNIA:  The periodic limb movement index was 0.  The  patient had 1 restroom trip.   IMPRESSIONS-RECOMMENDATIONS:  This was a suboptimal titration study.  The patient was titrated to a CPAP pressure setting of 15 cm of water  and at this pressure, he was observed in REM sleep and nonsupine sleep.  However, he continued to have respiratory events.  What I would recommend is, to have the patient undergo a auto CPAP  titration study at home to further assess the optimal pressure setting  for his CPAP setup.  If this is unsuccessful, he may need to be referred  back to the Sleep Lab for an additional in-lab titration study and I  would recommend that he start on CPAP of 15 cm of water.  In addition,  if he has difficulty tolerating elevated PAP levels, he may need to be  tried on BiPAP as well.   The patient needs to receive counseling with regards to the importance  of diet, exercise and weight reduction.   The patient had difficulty with mask fitting and did have evidence for  some air leak.  If he prefers nasal pillows then he may benefit from the  use of chin strap.      Coralyn Helling, MD  Diplomat, American Board of Sleep Medicine  Electronically Signed     VS/MEDQ  D:  10/18/2008 09:30:34  T:  10/18/2008 10:57:55  Job:  [id number]

## 2010-08-13 ENCOUNTER — Other Ambulatory Visit: Payer: Self-pay | Admitting: Internal Medicine

## 2010-08-13 ENCOUNTER — Other Ambulatory Visit: Payer: Self-pay

## 2010-08-13 DIAGNOSIS — Z0389 Encounter for observation for other suspected diseases and conditions ruled out: Secondary | ICD-10-CM

## 2010-08-13 DIAGNOSIS — Z0000 Encounter for general adult medical examination without abnormal findings: Secondary | ICD-10-CM

## 2010-08-14 ENCOUNTER — Other Ambulatory Visit (INDEPENDENT_AMBULATORY_CARE_PROVIDER_SITE_OTHER): Payer: Managed Care, Other (non HMO)

## 2010-08-14 DIAGNOSIS — Z0000 Encounter for general adult medical examination without abnormal findings: Secondary | ICD-10-CM

## 2010-08-14 DIAGNOSIS — Z0389 Encounter for observation for other suspected diseases and conditions ruled out: Secondary | ICD-10-CM

## 2010-08-14 LAB — BASIC METABOLIC PANEL
CO2: 29 mEq/L (ref 19–32)
Chloride: 103 mEq/L (ref 96–112)
Glucose, Bld: 119 mg/dL — ABNORMAL HIGH (ref 70–99)
Potassium: 5 mEq/L (ref 3.5–5.1)
Sodium: 138 mEq/L (ref 135–145)

## 2010-08-14 LAB — TSH: TSH: 1.62 u[IU]/mL (ref 0.35–5.50)

## 2010-08-14 LAB — CBC WITH DIFFERENTIAL/PLATELET
Basophils Absolute: 0 10*3/uL (ref 0.0–0.1)
Basophils Relative: 0.5 % (ref 0.0–3.0)
Eosinophils Relative: 2.3 % (ref 0.0–5.0)
HCT: 48.4 % (ref 39.0–52.0)
Hemoglobin: 16.6 g/dL (ref 13.0–17.0)
Lymphs Abs: 2.7 10*3/uL (ref 0.7–4.0)
Monocytes Relative: 8.4 % (ref 3.0–12.0)
Neutro Abs: 4.8 10*3/uL (ref 1.4–7.7)
RDW: 13 % (ref 11.5–14.6)

## 2010-08-14 LAB — URINALYSIS
Bilirubin Urine: NEGATIVE
Hgb urine dipstick: NEGATIVE
Ketones, ur: NEGATIVE
Leukocytes, UA: NEGATIVE
Nitrite: NEGATIVE
Urobilinogen, UA: 0.2 (ref 0.0–1.0)
pH: 5.5 (ref 5.0–8.0)

## 2010-08-14 LAB — LIPID PANEL
HDL: 39.2 mg/dL (ref >39.00)
LDL Cholesterol: 70 mg/dL (ref 0–99)
Total CHOL/HDL Ratio: 3
Triglycerides: 96 mg/dL (ref 0.0–149.0)
VLDL: 19.2 mg/dL (ref 0.0–40.0)

## 2010-08-14 LAB — HEPATIC FUNCTION PANEL
AST: 25 U/L (ref 0–37)
Total Bilirubin: 0.8 mg/dL (ref 0.3–1.2)

## 2010-08-18 ENCOUNTER — Ambulatory Visit (INDEPENDENT_AMBULATORY_CARE_PROVIDER_SITE_OTHER): Payer: Managed Care, Other (non HMO) | Admitting: Internal Medicine

## 2010-08-18 ENCOUNTER — Encounter: Payer: Self-pay | Admitting: Internal Medicine

## 2010-08-18 VITALS — BP 100/62 | HR 60 | Temp 98.5°F | Ht 76.0 in | Wt 307.1 lb

## 2010-08-18 DIAGNOSIS — R7309 Other abnormal glucose: Secondary | ICD-10-CM

## 2010-08-18 DIAGNOSIS — M25571 Pain in right ankle and joints of right foot: Secondary | ICD-10-CM | POA: Insufficient documentation

## 2010-08-18 DIAGNOSIS — M25579 Pain in unspecified ankle and joints of unspecified foot: Secondary | ICD-10-CM

## 2010-08-18 DIAGNOSIS — R7302 Impaired glucose tolerance (oral): Secondary | ICD-10-CM

## 2010-08-18 DIAGNOSIS — Z0001 Encounter for general adult medical examination with abnormal findings: Secondary | ICD-10-CM | POA: Insufficient documentation

## 2010-08-18 DIAGNOSIS — Z0000 Encounter for general adult medical examination without abnormal findings: Secondary | ICD-10-CM

## 2010-08-18 NOTE — Assessment & Plan Note (Signed)
Asympt;  No need for OHA at this time, for diet and wt loss

## 2010-08-18 NOTE — Patient Instructions (Signed)
Continue all other medications as before You will be contacted regarding the referral for: orthopedic - Dr Lestine Box Please followup with your general surgeon as you have planned for lap band Please return in 1 year for your yearly visit, or sooner if needed, with Lab testing done 3-5 days before

## 2010-08-18 NOTE — Progress Notes (Signed)
Subjective:    Patient ID: Antonio Reyes, male    DOB: October 23, 1945, 65 y.o.   MRN: [id number]  HPI  Here for wellness and f/u;  Overall doing ok;  Pt denies CP, worsening SOB, DOE, wheezing, orthopnea, PND, worsening LE edema, palpitations, dizziness or syncope.  Pt denies neurological change such as new Headache, facial or extremity weakness.  Pt denies polydipsia, polyuria, or low sugar symptoms. Pt states overall good compliance with treatment and medications, good tolerability, and trying to follow lower cholesterol diet.  Pt denies worsening depressive symptoms, suicidal ideation or panic. No fever, wt loss, night sweats, loss of appetite, or other constitutional symptoms.  Pt states good ability with ADL's, low fall risk, home safety reviewed and adequate, no significant changes in hearing or vision, and occasionally active with exercise.  Wt overall has been markedly increased in the past 12 yrs about 80 lbs. Past Medical History  Diagnosis Date  . Diverticulitis   . GERD (gastroesophageal reflux disease)   . HTN (hypertension)   . Sleep apnea     CPAP  . Migraine   . HLD (hyperlipidemia)   . ED (erectile dysfunction)   . Low back pain   . Colonic polyp   . Rotator cuff tear   . Anxiety   . History of herpes genitalis   . BPH (benign prostatic hypertrophy)    Past Surgical History  Procedure Date  . Hernia repair   . Knee surgery     mensicus    reports that he has been smoking Cigars.  He does not have any smokeless tobacco history on file. He reports that he drinks alcohol. He reports that he does not use illicit drugs. family history includes Cancer in his mother and Colon cancer in an unspecified family member. No Known Allergies  Review of SystemsReview of Systems  Constitutional: Negative for diaphoresis, activity change, appetite change and unexpected weight change.  HENT: Negative for hearing loss, ear pain, facial swelling, mouth sores and neck stiffness.   Eyes:  Negative for pain, redness and visual disturbance.  Respiratory: Negative for shortness of breath and wheezing.   Cardiovascular: Negative for chest pain and palpitations.  Gastrointestinal: Negative for diarrhea, blood in stool, abdominal distention and rectal pain.  Genitourinary: Negative for hematuria, flank pain and decreased urine volume.  Musculoskeletal: Negative for myalgias and joint swelling.  Skin: Negative for color change and wound.  Neurological: Negative for syncope and numbness.  Hematological: Negative for adenopathy.  Psychiatric/Behavioral: Negative for hallucinations, self-injury, decreased concentration and agitation.      Objective:   Physical Exam BP 100/62  Pulse 60  Temp(Src) 98.5 F (36.9 C) (Oral)  Ht 6\' 4"  (1.93 m)  Wt 307 lb 2 oz (139.311 kg)  BMI 37.38 kg/m2  SpO2 95% Physical Exam  VS noted Constitutional: Pt is oriented to person, place, and time. Appears well-developed and well-nourished.  HENT:  Head: Normocephalic and atraumatic.  Right Ear: External ear normal.  Left Ear: External ear normal.  Nose: Nose normal.  Mouth/Throat: Oropharynx is clear and moist.  Eyes: Conjunctivae and EOM are normal. Pupils are equal, round, and reactive to light.  Neck: Normal range of motion. Neck supple. No JVD present. No tracheal deviation present.  Cardiovascular: Normal rate, regular rhythm, normal heart sounds and intact distal pulses.   Pulmonary/Chest: Effort normal and breath sounds normal.  Abdominal: Soft. Bowel sounds are normal. There is no tenderness.  Musculoskeletal: Normal range of motion. Exhibits no edema.  Lymphadenopathy:  Has no cervical adenopathy.  Neurological: Pt is alert and oriented to person, place, and time. Pt has normal reflexes. No cranial nerve deficit.  Skin: Skin is warm and dry. No rash noted.  Psychiatric:  Has  normal mood and affect. Behavior is normal.  Has some mild right achilles tendon tenderness        Assessment & Plan:

## 2010-08-18 NOTE — Assessment & Plan Note (Signed)

## 2010-08-18 NOTE — Assessment & Plan Note (Signed)
Mild , inhibits activity level - to refer to ortho per pt request

## 2010-12-17 ENCOUNTER — Other Ambulatory Visit: Payer: Self-pay

## 2010-12-17 MED ORDER — ROSUVASTATIN CALCIUM 20 MG PO TABS: 20.0000 mg | ORAL_TABLET | Freq: Every day | ORAL | Status: DC

## 2010-12-17 MED ORDER — METOPROLOL TARTRATE 50 MG PO TABS: 50.0000 mg | ORAL_TABLET | Freq: Two times a day (BID) | ORAL | Status: DC

## 2011-06-08 ENCOUNTER — Encounter: Payer: Self-pay | Admitting: Internal Medicine

## 2011-06-08 ENCOUNTER — Ambulatory Visit (INDEPENDENT_AMBULATORY_CARE_PROVIDER_SITE_OTHER): Payer: Managed Care, Other (non HMO) | Admitting: Internal Medicine

## 2011-06-08 VITALS — BP 108/70 | HR 54 | Temp 97.8°F | Ht 76.0 in | Wt 297.5 lb

## 2011-06-08 DIAGNOSIS — J329 Chronic sinusitis, unspecified: Secondary | ICD-10-CM | POA: Insufficient documentation

## 2011-06-08 DIAGNOSIS — F411 Generalized anxiety disorder: Secondary | ICD-10-CM

## 2011-06-08 DIAGNOSIS — R42 Dizziness and giddiness: Secondary | ICD-10-CM

## 2011-06-08 DIAGNOSIS — I10 Essential (primary) hypertension: Secondary | ICD-10-CM

## 2011-06-08 MED ORDER — MECLIZINE HCL 12.5 MG PO TABS: 12.5000 mg | ORAL_TABLET | Freq: Three times a day (TID) | ORAL | Status: AC | PRN

## 2011-06-08 MED ORDER — MONTELUKAST SODIUM 10 MG PO TABS: 10.0000 mg | ORAL_TABLET | Freq: Every day | ORAL | Status: DC

## 2011-06-08 MED ORDER — FLUTICASONE PROPIONATE 50 MCG/ACT NA SUSP: 2.0000 | Freq: Every day | NASAL | Status: DC

## 2011-06-08 NOTE — Patient Instructions (Signed)
Take all new medications as prescribed Continue all other medications as before Please return in 3 mo with Lab testing done 3-5 days before  

## 2011-06-08 NOTE — Assessment & Plan Note (Addendum)
ECG reviewed as per emr, likely vertigo related to recent sinus flare - for vertigo prn,  to f/u any worsening symptoms or concerns

## 2011-06-08 NOTE — Progress Notes (Signed)
Subjective:    Patient ID: Antonio Reyes, male    DOB: 07-14-1945, 66 y.o.   MRN: [id number]  HPI  Here with 2-3 days onset marked dizziness and room spinning such that he had to hold on to the wall with getting up to the BR at night, and any other position change such as bending forward.  Has nasal/sinus allergies, with increased drainage the 2 days prior to onset vertigo; no fever, HA, ear pain, hearing loss, HA, ST, cough.  Did call MD at work, suggested something in the ear canal?  Symptoms have resolved for the last 24 hrs. Stil using the CPAP at home, but not with traveling , and sometimes not all night at home.  Pt denies chest pain, increased sob or doe, wheezing, orthopnea, PND, increased LE swelling, palpitations, dizziness or syncope.  Pt denies new neurological symptoms such as new headache, or facial or extremity weakness or numbness  Denies worsening depressive symptoms, suicidal ideation, or panic Past Medical History  Diagnosis Date  . Diverticulitis   . GERD (gastroesophageal reflux disease)   . HTN (hypertension)   . Sleep apnea     CPAP  . Migraine   . HLD (hyperlipidemia)   . ED (erectile dysfunction)   . Low back pain   . Colonic polyp   . Rotator cuff tear   . Anxiety   . History of herpes genitalis   . BPH (benign prostatic hypertrophy)    Past Surgical History  Procedure Date  . Hernia repair   . Knee surgery     mensicus    reports that he has been smoking Cigars.  He does not have any smokeless tobacco history on file. He reports that he drinks alcohol. He reports that he does not use illicit drugs. family history includes Cancer in his mother and Colon cancer in an unspecified family member. No Known Allergies Current Outpatient Prescriptions on File Prior to Visit  Medication Sig Dispense Refill  . aspirin 81 MG tablet Take 81 mg by mouth daily.        . metoprolol (LOPRESSOR) 50 MG tablet Take 1 tablet (50 mg total) by mouth 2 (two) times daily.  180  tablet  2  . rosuvastatin (CRESTOR) 20 MG tablet Take 1 tablet (20 mg total) by mouth daily.  90 tablet  2   Review of Systems Review of Systems  Constitutional: Negative for diaphoresis and unexpected weight change.  HENT: Negative for drooling and tinnitus.   Eyes: Negative for photophobia and visual disturbance.  Respiratory: Negative for choking and stridor.   Gastrointestinal: Negative for vomiting and blood in stool.  Genitourinary: Negative for hematuria and decreased urine volume.  Musculoskeletal: Negative for gait problem.    Objective:   Physical Exam BP 108/70  Pulse 54  Temp(Src) 97.8 F (36.6 C) (Oral)  Ht 6\' 4"  (1.93 m)  Wt 297 lb 8 oz (134.945 kg)  BMI 36.21 kg/m2  SpO2 94% Physical Exam  VS noted Constitutional: Pt appears well-developed and well-nourished.  HENT: Head: Normocephalic.  Right Ear: External ear normal.  Left Ear: External ear normal.  Bilat tm's mild erythema.  Sinus tender.  Pharynx mild erythema Eyes: Conjunctivae and EOM are normal. Pupils are equal, round, and reactive to light.  Neck: Normal range of motion. Neck supple.  Cardiovascular: Normal rate and regular rhythm.   Pulmonary/Chest: Effort normal and breath sounds normal.  Neurological: Pt is alert. No cranial nerve deficit.  Skin: Skin is warm.  No erythema.  Psychiatric: Pt behavior is normal. Thought content normal.     Assessment & Plan:

## 2011-06-13 ENCOUNTER — Encounter: Payer: Self-pay | Admitting: Internal Medicine

## 2011-06-13 NOTE — Assessment & Plan Note (Signed)
stable overall by hx and exam, most recent data reviewed with pt, and pt to continue medical treatment as before  Lab Results  Component Value Date   WBC 8.4 08/14/2010   HGB 16.6 08/14/2010   HCT 48.4 08/14/2010   PLT 246.0 08/14/2010   GLUCOSE 119* 08/14/2010   CHOL 128 08/14/2010   TRIG 96.0 08/14/2010   HDL 39.20 08/14/2010   LDLDIRECT 148.7 04/12/2008   LDLCALC 70 08/14/2010   ALT 35 08/14/2010   AST 25 08/14/2010   NA 138 08/14/2010   K 5.0 08/14/2010   CL 103 08/14/2010   CREATININE 1.2 08/14/2010   BUN 19 08/14/2010   CO2 29 08/14/2010   TSH 1.62 08/14/2010   PSA 0.51 08/14/2010   HGBA1C 6.0 08/14/2010

## 2011-06-13 NOTE — Assessment & Plan Note (Signed)
With mild flare;  For flonase,singulair,  to f/u any worsening symptoms or concerns

## 2011-06-13 NOTE — Assessment & Plan Note (Signed)
stable overall by hx and exam, most recent data reviewed with pt, and pt to continue medical treatment as before  BP Readings from Last 3 Encounters:  06/08/11 108/70  08/18/10 100/62  05/29/10 142/75

## 2011-11-02 ENCOUNTER — Other Ambulatory Visit: Payer: Self-pay

## 2011-11-02 MED ORDER — ROSUVASTATIN CALCIUM 20 MG PO TABS: 20.0000 mg | ORAL_TABLET | Freq: Every day | ORAL | Status: DC

## 2011-11-15 ENCOUNTER — Telehealth: Payer: Self-pay | Admitting: Internal Medicine

## 2011-11-15 DIAGNOSIS — K921 Melena: Secondary | ICD-10-CM

## 2011-11-15 NOTE — Telephone Encounter (Signed)
Patient calling, having blood in his stools.  There is enough blood that it turns the toilet water red in color.  His normal bowel routine is once every 3 days.  He is asking for a referral to have another colonscopy done but he had one < 5 years ago.  Dr. Russella Dar , GI.

## 2011-11-16 NOTE — Telephone Encounter (Signed)
Called left message to call back 

## 2011-11-16 NOTE — Telephone Encounter (Signed)
Called the patient informed of MD's information. The patient is having no symptoms fever, pain dizziness and was instructed by CAN to go to ER.  He stated he will just wait for now to hear from Dr. Ardell Isaacs office on appt. But did promise to call immediately if anything changes or go to the ER as instructed.

## 2011-11-16 NOTE — Telephone Encounter (Signed)
Since last colonscopy < 5 yrs, will refer to GI for consideration

## 2011-11-16 NOTE — Telephone Encounter (Signed)
Not sure he can be seen per Dr Russella Dar immediately, ok for OV if he needs to be seen sooner

## 2011-11-16 NOTE — Telephone Encounter (Signed)
Patient informed of referral.  Patient is having the same symptoms today and would like to hear asap on appt.

## 2011-11-17 ENCOUNTER — Encounter: Payer: Self-pay | Admitting: Gastroenterology

## 2011-12-20 ENCOUNTER — Ambulatory Visit (INDEPENDENT_AMBULATORY_CARE_PROVIDER_SITE_OTHER): Payer: Managed Care, Other (non HMO) | Admitting: Gastroenterology

## 2011-12-20 ENCOUNTER — Encounter: Payer: Self-pay | Admitting: Gastroenterology

## 2011-12-20 VITALS — BP 126/80 | HR 72 | Ht 76.0 in | Wt 309.8 lb

## 2011-12-20 DIAGNOSIS — Z86010 Personal history of colonic polyps: Secondary | ICD-10-CM

## 2011-12-20 DIAGNOSIS — K921 Melena: Secondary | ICD-10-CM

## 2011-12-20 DIAGNOSIS — Z800 Family history of malignant neoplasm of digestive organs: Secondary | ICD-10-CM

## 2011-12-20 MED ORDER — MOVIPREP 100 G PO SOLR: 1.0000 | Freq: Once | ORAL | Status: DC

## 2011-12-20 NOTE — Patient Instructions (Addendum)
You have been given a separate informational sheet regarding your tobacco use, the importance of quitting and local resources to help you quit.  You have been scheduled for a colonoscopy with propofol. Please follow written instructions given to you at your visit today.  Please pick up your prep kit at the pharmacy within the next 1-3 days. If you use inhalers (even only as needed), please bring them with you on the day of your procedure.  We have sent the following medications to your pharmacy for you to pick up at your convenience: Moviprep

## 2011-12-20 NOTE — Progress Notes (Addendum)
History of Present Illness: This is a 66 year old male who relates intermittent, small volume rectal bleeding with bowel movements since July. He is noted a slight change in bowel habits with an extra day or so between bowel movements. His most recent colonoscopy was in March 2010 showing small adenomatous colon polyps and internal hemorrhoids. Denies weight loss, abdominal pain, constipation, diarrhea, change in stool caliber, melena, nausea, vomiting, dysphagia, reflux symptoms, chest pain.  Review of Systems: Pertinent positive and negative review of systems were noted in the above HPI section. All other review of systems were otherwise negative.  Current Medications, Allergies, Past Medical History, Past Surgical History, Family History and Social History were reviewed in [employer] record.  Physical Exam: General: Well developed , well nourished, no acute distress Head: Normocephalic and atraumatic Eyes:  sclerae anicteric, EOMI Ears: Normal auditory acuity Mouth: No deformity or lesions Neck: Supple, no masses or thyromegaly Lungs: Clear throughout to auscultation Heart: Regular rate and rhythm; no murmurs, rubs or bruits Abdomen: Soft, non tender and non distended. No masses, hepatosplenomegaly or hernias noted. Normal Bowel sounds Rectal: deferred to colonoscopy Musculoskeletal: Symmetrical with no gross deformities  Skin: No lesions on visible extremities Pulses:  Normal pulses noted Extremities: No clubbing, cyanosis, edema or deformities noted Neurological: Alert oriented x 4, grossly nonfocal Cervical Nodes:  No significant cervical adenopathy Inguinal Nodes: No significant inguinal adenopathy Psychological:  Alert and cooperative. Normal mood and affect  Assessment and Recommendations:  1. Hematochezia, small-volume. Slight change in bowel habits. His rectal bleeding is likely due to internal hemorrhoids. We discussed the options of proceeding with  rectal examination today and then possible suppository treatment for internal hemorrhoids. He is very concerned with his prior history of colon polyps and his mother's history of colon cancer and prefers to proceed with colonoscopy to more definitively evaluate his symptoms. We will defer rectal exam today at his request since we are proceeding with colonoscopy soon. Increase dietary fiber and water intake. Schedule colonoscopy. The risks, benefits, and alternatives to colonoscopy with possible biopsy and possible polypectomy were discussed with the patient and they consent to proceed.   2. Personal history of adenomatous colon polyps.  3. Family history of colon cancer, mother.

## 2011-12-29 ENCOUNTER — Encounter: Payer: Managed Care, Other (non HMO) | Admitting: Gastroenterology

## 2012-01-25 ENCOUNTER — Encounter: Payer: Self-pay | Admitting: Gastroenterology

## 2012-01-25 ENCOUNTER — Ambulatory Visit (AMBULATORY_SURGERY_CENTER): Payer: Managed Care, Other (non HMO) | Admitting: Gastroenterology

## 2012-01-25 VITALS — BP 118/62 | HR 49 | Temp 97.1°F | Resp 18 | Ht 76.0 in | Wt 309.0 lb

## 2012-01-25 DIAGNOSIS — D126 Benign neoplasm of colon, unspecified: Secondary | ICD-10-CM

## 2012-01-25 DIAGNOSIS — Z800 Family history of malignant neoplasm of digestive organs: Secondary | ICD-10-CM

## 2012-01-25 DIAGNOSIS — Z86010 Personal history of colonic polyps: Secondary | ICD-10-CM

## 2012-01-25 DIAGNOSIS — K921 Melena: Secondary | ICD-10-CM

## 2012-01-25 MED ORDER — SODIUM CHLORIDE 0.9 % IV SOLN: 500.0000 mL | INTRAVENOUS | Status: DC

## 2012-01-25 NOTE — Progress Notes (Signed)
Patient did not experience any of the following events: a burn prior to discharge; a fall within the facility; wrong site/side/patient/procedure/implant event; or a hospital transfer or hospital admission upon discharge from the facility. (G8907) Patient did not have preoperative order for IV antibiotic SSI prophylaxis. (G8918)  

## 2012-01-25 NOTE — Op Note (Signed)
[facility] [address] [place], [address]   COLONOSCOPY PROCEDURE REPORT PATIENT: Antonio Reyes, Antonio Reyes  MR#: [id number] BIRTHDATE: May 19, 1945 , 66  yrs. old GENDER: Male ENDOSCOPIST: Meryl Dare, MD, [facility] PROCEDURE DATE:  01/25/2012 PROCEDURE:   Colonoscopy with snare polypectomy ASA CLASS:   Class II INDICATIONS:hematochezia, patient's personal history of adenomatous colon polyps, patient's immediate fam history of colon cancer. MEDICATIONS: MAC sedation, administered by CRNA and propofol (Diprivan) 200mg  IV DESCRIPTION OF PROCEDURE:   After the risks benefits and alternatives of the procedure were thoroughly explained, informed consent was obtained.  A digital rectal exam revealed no abnormalities of the rectum.   The LB [id number]  endoscope was introduced through the anus and advanced to the cecum, which was identified by both the appendix and ileocecal valve. No adverse events experienced.   The quality of the prep was adequate, using MoviPrep  The instrument was then slowly withdrawn as the colon was fully examined.  COLON FINDINGS: Two sessile polyps measuring 5-7 mm in size were found in the ascending colon.  A polypectomy was performed with a cold snare.  The resection was complete and the polyp tissue was completely retrieved.   A sessile polyp measuring 7 mm in size was found at the hepatic flexure.  A polypectomy was performed with a cold snare.  The resection was complete and the polyp tissue was completely retrieved.   A sessile polyp measuring 6 mm in size was found in the transverse colon.  A polypectomy was performed with a cold snare.  The resection was complete and the polyp tissue was completely retrieved.   Moderate diverticulosis was noted in the transverse colon and descending colon.   The colon was otherwise normal.  There was no diverticulosis, inflammation, polyps or cancers unless previously stated.  Retroflexed views  revealed internal hemorrhoids. The time to cecum=2 minutes 09 seconds. Withdrawal time=12 minutes 31 seconds.  The scope was withdrawn and the procedure completed. COMPLICATIONS: There were no complications. ENDOSCOPIC IMPRESSION: 1.   Two sessile polyps in the ascending colon; polypectomy performed with a cold snare 2.   Sessile polyp at the hepatic flexure; polypectomy performed with a cold snare 3.   Sessile polyp in the transverse colon; polypectomy performed with a cold snare 4.   Moderate diverticulosis was noted in the transverse colon and descending colon 5.   Internal hemorrhoids  RECOMMENDATIONS: 1.  Await pathology results 2.  High fiber diet with liberal fluid intake 3.  Repeat Colonoscopy in 3 years  eSigned:  Meryl Dare, MD, [facility] 01/25/2012 1:49 PM revsed

## 2012-01-25 NOTE — Progress Notes (Signed)
VSS-NAC NOTED.TOLERATED ANESTHESIA WELL.PACU REPORT

## 2012-01-25 NOTE — Patient Instructions (Addendum)
YOU HAD AN ENDOSCOPIC PROCEDURE TODAY AT THE [facility]: Refer to the procedure report that was given to you for any specific questions about what was found during the examination.  If the procedure report does not answer your questions, please call your gastroenterologist to clarify.  If you requested that your care partner not be given the details of your procedure findings, then the procedure report has been included in a sealed envelope for you to review at your convenience later.  YOU SHOULD EXPECT: Some feelings of bloating in the abdomen. Passage of more gas than usual.  Walking can help get rid of the air that was put into your GI tract during the procedure and reduce the bloating. If you had a lower endoscopy (such as a colonoscopy or flexible sigmoidoscopy) you may notice spotting of blood in your stool or on the toilet paper. If you underwent a bowel prep for your procedure, then you may not have a normal bowel movement for a few days.  DIET: Your first meal following the procedure should be a light meal and then it is ok to progress to your normal diet.  A half-sandwich or bowl of soup is an example of a good first meal.  Heavy or fried foods are harder to digest and may make you feel nauseous or bloated.  Likewise meals heavy in dairy and vegetables can cause extra gas to form and this can also increase the bloating.  Drink plenty of fluids but you should avoid alcoholic beverages for 24 hours.  ACTIVITY: Your care partner should take you home directly after the procedure.  You should plan to take it easy, moving slowly for the rest of the day.  You can resume normal activity the day after the procedure however you should NOT DRIVE or use heavy machinery for 24 hours (because of the sedation medicines used during the test).    SYMPTOMS TO REPORT IMMEDIATELY: A gastroenterologist can be reached at any hour.  During normal business hours, 8:30 AM to 5:00 PM Monday through Friday,  call [phone].  After hours and on weekends, please call the GI answering service at [phone] who will take a message and have the physician on call contact you.   Following lower endoscopy (colonoscopy or flexible sigmoidoscopy):  Excessive amounts of blood in the stool  Significant tenderness or worsening of abdominal pains  Swelling of the abdomen that is new, acute  Fever of 100F or higher  Following upper endoscopy (EGD)  Vomiting of blood or coffee ground material  New chest pain or pain under the shoulder blades  Painful or persistently difficult swallowing  New shortness of breath  Fever of 100F or higher  Black, tarry-looking stools  FOLLOW UP: If any biopsies were taken you will be contacted by phone or by letter within the next 1-3 weeks.  Call your gastroenterologist if you have not heard about the biopsies in 3 weeks.  Our staff will call the home number listed on your records the next business day following your procedure to check on you and address any questions or concerns that you may have at that time regarding the information given to you following your procedure. This is a courtesy call and so if there is no answer at the home number and we have not heard from you through the emergency physician on call, we will assume that you have returned to your regular daily activities without incident.  SIGNATURES/CONFIDENTIALITY: You and/or your care   partner have signed paperwork which will be entered into your electronic medical record.  These signatures attest to the fact that that the information above on your After Visit Summary has been reviewed and is understood.  Full responsibility of the confidentiality of this discharge information lies with you and/or your care-partner.  

## 2012-01-26 ENCOUNTER — Telehealth: Payer: Self-pay | Admitting: *Deleted

## 2012-01-26 NOTE — Telephone Encounter (Signed)
  Follow up Call-  Call back number 01/25/2012  Post procedure Call Back phone  # [phone]  Permission to leave phone message Yes     Patient questions:  Do you have a fever, pain , or abdominal swelling? no Pain Score  0 *  Have you tolerated food without any problems? yes  Have you been able to return to your normal activities? yes  Do you have any questions about your discharge instructions: Diet   no Medications  no Follow up visit  no  Do you have questions or concerns about your Care? no  Actions: * If pain score is 4 or above: No action needed, pain <4.

## 2012-01-31 ENCOUNTER — Encounter: Payer: Self-pay | Admitting: Gastroenterology

## 2012-02-08 ENCOUNTER — Telehealth: Payer: Self-pay

## 2012-02-08 NOTE — Telephone Encounter (Signed)
The patient mailed a letter requesting the Dr. Russella Dar write a letter or recommend an expedited gastric banding procedure.  Per Dr. Russella Dar the patient is advised that he will need to work through [employer] and [employer].  He is advised that he will need to follow the program that his insurance company has.  Unfortunately Dr. Russella Dar does not have any influence on expediting or skipping steps in the precertification process for gastric banding.

## 2012-04-17 ENCOUNTER — Other Ambulatory Visit: Payer: Self-pay

## 2012-04-17 MED ORDER — ROSUVASTATIN CALCIUM 20 MG PO TABS: 20.0000 mg | ORAL_TABLET | Freq: Every day | ORAL | Status: DC

## 2012-04-17 MED ORDER — METOPROLOL TARTRATE 50 MG PO TABS: 50.0000 mg | ORAL_TABLET | Freq: Two times a day (BID) | ORAL | Status: DC

## 2012-04-17 MED ORDER — MONTELUKAST SODIUM 10 MG PO TABS: 10.0000 mg | ORAL_TABLET | Freq: Every day | ORAL | Status: DC

## 2012-04-18 ENCOUNTER — Telehealth: Payer: Self-pay

## 2012-04-18 MED ORDER — ATORVASTATIN CALCIUM 40 MG PO TABS: 40.0000 mg | ORAL_TABLET | Freq: Every day | ORAL | Status: DC

## 2012-04-18 NOTE — Telephone Encounter (Signed)
Any change the lipitor is covered, as this would be the best alternative

## 2012-04-18 NOTE — Telephone Encounter (Signed)
Done erx 

## 2012-04-18 NOTE — Telephone Encounter (Signed)
Patient stated ok to change to Lipitor

## 2012-04-18 NOTE — Telephone Encounter (Signed)
Crestor requires a PA .  Covered formulary alternative are generic lovastatin, pravastatin and simvastatin.  Please advise on PA or offer alternative

## 2012-07-24 ENCOUNTER — Other Ambulatory Visit: Payer: Self-pay | Admitting: *Deleted

## 2012-07-24 MED ORDER — METOPROLOL TARTRATE 50 MG PO TABS: 50.0000 mg | ORAL_TABLET | Freq: Two times a day (BID) | ORAL | Status: DC

## 2012-07-24 MED ORDER — MONTELUKAST SODIUM 10 MG PO TABS: 10.0000 mg | ORAL_TABLET | Freq: Every day | ORAL | Status: DC

## 2012-07-24 NOTE — Telephone Encounter (Signed)
R'cd fax from [facility] for refills of Singulair and Metoprolol.

## 2012-09-01 ENCOUNTER — Ambulatory Visit (INDEPENDENT_AMBULATORY_CARE_PROVIDER_SITE_OTHER): Payer: BC Managed Care – PPO | Admitting: Internal Medicine

## 2012-09-01 ENCOUNTER — Encounter: Payer: Self-pay | Admitting: Internal Medicine

## 2012-09-01 VITALS — BP 110/70 | HR 69 | Temp 98.0°F | Ht 76.0 in | Wt 301.0 lb

## 2012-09-01 DIAGNOSIS — R0981 Nasal congestion: Secondary | ICD-10-CM

## 2012-09-01 DIAGNOSIS — J3489 Other specified disorders of nose and nasal sinuses: Secondary | ICD-10-CM

## 2012-09-01 DIAGNOSIS — J309 Allergic rhinitis, unspecified: Secondary | ICD-10-CM

## 2012-09-01 DIAGNOSIS — J069 Acute upper respiratory infection, unspecified: Secondary | ICD-10-CM

## 2012-09-01 MED ORDER — FLUTICASONE PROPIONATE 50 MCG/ACT NA SUSP: 2.0000 | Freq: Every day | NASAL | Status: DC

## 2012-09-01 MED ORDER — AZITHROMYCIN 250 MG PO TABS: ORAL_TABLET | ORAL | Status: DC

## 2012-09-02 NOTE — Progress Notes (Signed)
HPI  Pt presents to the clinic today with c/o cold symptoms x 2 weeks. The worst part is the sore throat and dry cough. He does not produce any sputum. He denies fevers. He has tried Robitussin, Mucinex, cough drops and nothing seems to help. He did recently come back from a trip to [place]. He does have a history of allergies but no asthma. He does have sick contacts.  Review of Systems      Past Medical History  Diagnosis Date  . Diverticulitis   . GERD (gastroesophageal reflux disease)   . HTN (hypertension)   . Sleep apnea     CPAP  . Migraine   . HLD (hyperlipidemia)   . ED (erectile dysfunction)   . Low back pain   . Tubular adenoma of colon 05/2008  . Rotator cuff tear   . Anxiety   . History of herpes genitalis   . BPH (benign prostatic hypertrophy)   . Benign neoplasm of stomach     Family History  Problem Relation Age of Onset  . Colon cancer Mother     78's    History   Social History  . Marital Status: Married    Spouse Name: N/A    Number of Children: 1  . Years of Education: N/A   Occupational History  . sales and marketing Surveyor, [employer])    Social History Main Topics  . Smoking status: Current Some Day Smoker    Types: Cigars  . Smokeless tobacco: Never Used     Comment: occasionally  . Alcohol Use: Yes     Comment: social 2 beers a month  . Drug Use: No  . Sexually Active: Not on file   Other Topics Concern  . Not on file   Social History Narrative  . No narrative on file    No Known Allergies   Constitutional: Positive headache, fatigue. Denies fever, abrupt weight changes.  HEENT:  Positive nasal congestion, sore throat. Denies eye redness, eye pain, pressure behind the eyes, facial pain,  ear pain, ringing in the ears, wax buildup, runny nose or bloody nose. Respiratory: Positive cough. Denies difficulty breathing or shortness of breath.  Cardiovascular: Denies chest pain, chest tightness, palpitations or swelling in the hands or feet.    No other specific complaints in a complete review of systems (except as listed in HPI above).  Objective:   BP 110/70  Pulse 69  Temp(Src) 98 F (36.7 C) (Oral)  Ht 6\' 4"  (1.93 m)  Wt 301 lb (136.533 kg)  BMI 36.65 kg/m2  SpO2 94% Wt Readings from Last 3 Encounters:  09/01/12 301 lb (136.533 kg)  01/25/12 309 lb (140.161 kg)  12/20/11 309 lb 12.8 oz (140.524 kg)     General: Appears his stated age, well developed, well nourished in NAD. HEENT: Head: normal shape and size; Eyes: sclera white, no icterus, conjunctiva pink, PERRLA and EOMs intact; Ears: Tm's gray and intact, normal light reflex; Nose: mucosa pink and moist, septum midline; Throat/Mouth: + PND. Teeth present, mucosa erythematous and moist, no exudate noted, no lesions or ulcerations noted.  Neck: Mild cervical lymphadenopathy. Neck supple, trachea midline. No massses, lumps or thyromegaly present.  Cardiovascular: Normal rate and rhythm. S1,S2 noted.  No murmur, rubs or gallops noted. No JVD or BLE edema. No carotid bruits noted. Pulmonary/Chest: Normal effort and positive vesicular breath sounds. No respiratory distress. No wheezes, rales or ronchi noted.      Assessment & Plan:  Upper Respiratory Infection  Get some rest and drink plenty of water Do salt water gargles for the sore throat eRx for Azithromax x 5 days  Allergic Rhinits  Will add flonase Continue OTC antihistamine  RTC as needed or if symptoms persist.

## 2012-09-02 NOTE — Patient Instructions (Signed)

## 2013-02-09 ENCOUNTER — Other Ambulatory Visit (INDEPENDENT_AMBULATORY_CARE_PROVIDER_SITE_OTHER): Payer: BC Managed Care – PPO

## 2013-02-09 ENCOUNTER — Ambulatory Visit (INDEPENDENT_AMBULATORY_CARE_PROVIDER_SITE_OTHER): Payer: BC Managed Care – PPO | Admitting: Internal Medicine

## 2013-02-09 ENCOUNTER — Encounter: Payer: Self-pay | Admitting: Internal Medicine

## 2013-02-09 VITALS — BP 84/62 | HR 55 | Temp 98.2°F | Ht 76.0 in | Wt 278.4 lb

## 2013-02-09 DIAGNOSIS — R0981 Nasal congestion: Secondary | ICD-10-CM

## 2013-02-09 DIAGNOSIS — J3489 Other specified disorders of nose and nasal sinuses: Secondary | ICD-10-CM

## 2013-02-09 DIAGNOSIS — D751 Secondary polycythemia: Secondary | ICD-10-CM | POA: Insufficient documentation

## 2013-02-09 DIAGNOSIS — R7309 Other abnormal glucose: Secondary | ICD-10-CM

## 2013-02-09 DIAGNOSIS — Z0000 Encounter for general adult medical examination without abnormal findings: Secondary | ICD-10-CM

## 2013-02-09 DIAGNOSIS — Z136 Encounter for screening for cardiovascular disorders: Secondary | ICD-10-CM

## 2013-02-09 DIAGNOSIS — Z23 Encounter for immunization: Secondary | ICD-10-CM

## 2013-02-09 DIAGNOSIS — R7302 Impaired glucose tolerance (oral): Secondary | ICD-10-CM

## 2013-02-09 DIAGNOSIS — J309 Allergic rhinitis, unspecified: Secondary | ICD-10-CM

## 2013-02-09 LAB — BASIC METABOLIC PANEL
CO2: 26 mEq/L (ref 19–32)
Calcium: 9.3 mg/dL (ref 8.4–10.5)
Creatinine, Ser: 1.1 mg/dL (ref 0.4–1.5)
GFR: 71.31 mL/min (ref >60.00)
Glucose, Bld: 106 mg/dL — ABNORMAL HIGH (ref 70–99)
Potassium: 4.5 mEq/L (ref 3.5–5.1)
Sodium: 137 mEq/L (ref 135–145)

## 2013-02-09 LAB — CBC WITH DIFFERENTIAL/PLATELET
Basophils Absolute: 0.1 10*3/uL (ref 0.0–0.1)
Eosinophils Absolute: 0.2 10*3/uL (ref 0.0–0.7)
Eosinophils Relative: 1.9 % (ref 0.0–5.0)
HCT: 50 % (ref 39.0–52.0)
Hemoglobin: 17 g/dL (ref 13.0–17.0)
Lymphocytes Relative: 26.8 % (ref 12.0–46.0)
Lymphs Abs: 2.4 10*3/uL (ref 0.7–4.0)
MCHC: 34 g/dL (ref 30.0–36.0)
MCV: 89.6 fl (ref 78.0–100.0)
Monocytes Absolute: 0.7 10*3/uL (ref 0.1–1.0)
Neutro Abs: 5.8 10*3/uL (ref 1.4–7.7)
Platelets: 282 10*3/uL (ref 150.0–400.0)
RBC: 5.58 Mil/uL (ref 4.22–5.81)
RDW: 12.9 % (ref 11.5–14.6)
WBC: 9.1 10*3/uL (ref 4.5–10.5)

## 2013-02-09 LAB — TSH: TSH: 2.71 u[IU]/mL (ref 0.35–5.50)

## 2013-02-09 LAB — URINALYSIS, ROUTINE W REFLEX MICROSCOPIC
Bilirubin Urine: NEGATIVE
Hgb urine dipstick: NEGATIVE
Ketones, ur: NEGATIVE
Leukocytes, UA: NEGATIVE
Nitrite: NEGATIVE
Specific Gravity, Urine: >=1.03 (ref 1.000–1.030)
Urine Glucose: NEGATIVE
Urobilinogen, UA: 0.2 (ref 0.0–1.0)
pH: 6 (ref 5.0–8.0)

## 2013-02-09 LAB — HEPATIC FUNCTION PANEL
AST: 22 U/L (ref 0–37)
Albumin: 4.1 g/dL (ref 3.5–5.2)
Alkaline Phosphatase: 88 U/L (ref 39–117)
Bilirubin, Direct: 0.2 mg/dL (ref 0.0–0.3)
Total Bilirubin: 1.1 mg/dL (ref 0.3–1.2)
Total Protein: 6.6 g/dL (ref 6.0–8.3)

## 2013-02-09 LAB — LIPID PANEL
HDL: 32.2 mg/dL — ABNORMAL LOW (ref >39.00)
LDL Cholesterol: 53 mg/dL (ref 0–99)
Total CHOL/HDL Ratio: 3
VLDL: 18.6 mg/dL (ref 0.0–40.0)

## 2013-02-09 LAB — PSA: PSA: 0.63 ng/mL (ref 0.10–4.00)

## 2013-02-09 MED ORDER — FLUTICASONE PROPIONATE 50 MCG/ACT NA SUSP: 2.0000 | Freq: Every day | NASAL | Status: DC

## 2013-02-09 MED ORDER — METOPROLOL SUCCINATE ER 50 MG PO TB24: 50.0000 mg | ORAL_TABLET | Freq: Every day | ORAL | Status: DC

## 2013-02-09 MED ORDER — MONTELUKAST SODIUM 10 MG PO TABS: 10.0000 mg | ORAL_TABLET | Freq: Every day | ORAL | Status: DC

## 2013-02-09 MED ORDER — ATORVASTATIN CALCIUM 40 MG PO TABS: 40.0000 mg | ORAL_TABLET | Freq: Every day | ORAL | Status: DC

## 2013-02-09 NOTE — Progress Notes (Signed)
Pre-visit discussion using our clinic review tool. No additional management support is needed unless otherwise documented below in the visit note.  

## 2013-02-09 NOTE — Addendum Note (Signed)
Addended by: Scharlene Gloss B on: 02/09/2013 09:27 AM   Modules accepted: Orders

## 2013-02-09 NOTE — Assessment & Plan Note (Signed)

## 2013-02-09 NOTE — Progress Notes (Signed)
Subjective:    Patient ID: Antonio Reyes, male    DOB: 02-03-1946, 67 y.o.   MRN: [id number]  HPI Here for wellness and f/u;  Overall doing ok;  Pt denies CP, worsening SOB, DOE, wheezing, orthopnea, PND, worsening LE edema, palpitations, dizziness or syncope.  Pt denies neurological change such as new headache, facial or extremity weakness.  Pt denies polydipsia, polyuria, or low sugar symptoms. Pt states overall good compliance with treatment and medications, good tolerability, and has been trying to follow lower cholesterol diet.  Pt denies worsening depressive symptoms, suicidal ideation or panic. No fever, night sweats, wt loss, loss of appetite, or other constitutional symptoms.  Pt states good ability with ADL's, has low fall risk, home safety reviewed and adequate, no other significant changes in hearing or vision, and only occasionally active with exercise.  Lost 30 lbs in a few months intentionally, no BP on lower side.  Past Medical History  Diagnosis Date  . Diverticulitis   . GERD (gastroesophageal reflux disease)   . HTN (hypertension)   . Sleep apnea     CPAP  . Migraine   . HLD (hyperlipidemia)   . ED (erectile dysfunction)   . Low back pain   . Tubular adenoma of colon 05/2008  . Rotator cuff tear   . Anxiety   . History of herpes genitalis   . BPH (benign prostatic hypertrophy)   . Benign neoplasm of stomach    Past Surgical History  Procedure Laterality Date  . Hernia repair    . Knee surgery      mensicus    reports that he has been smoking Cigars.  He has never used smokeless tobacco. He reports that he drinks alcohol. He reports that he does not use illicit drugs. family history includes Colon cancer in his mother. No Known Allergies Current Outpatient Prescriptions on File Prior to Visit  Medication Sig Dispense Refill  . aspirin 81 MG tablet Take 81 mg by mouth daily.        Marland Kitchen atorvastatin (LIPITOR) 40 MG tablet Take 1 tablet (40 mg total) by mouth daily.   90 tablet  3  . fluticasone (FLONASE) 50 MCG/ACT nasal spray Place 2 sprays into the nose daily.  16 g  6  . montelukast (SINGULAIR) 10 MG tablet Take 1 tablet (10 mg total) by mouth daily.  90 tablet  0  . fluticasone (FLONASE) 50 MCG/ACT nasal spray Place 2 sprays into the nose daily.  16 g  2   No current facility-administered medications on file prior to visit.   Review of Systems Constitutional: Negative for diaphoresis, activity change, appetite change or unexpected weight change.  HENT: Negative for hearing loss, ear pain, facial swelling, mouth sores and neck stiffness.   Eyes: Negative for pain, redness and visual disturbance.  Respiratory: Negative for shortness of breath and wheezing.   Cardiovascular: Negative for chest pain and palpitations.  Gastrointestinal: Negative for diarrhea, blood in stool, abdominal distention or other pain Genitourinary: Negative for hematuria, flank pain or change in urine volume.  Musculoskeletal: Negative for myalgias and joint swelling.  Skin: Negative for color change and wound.  Neurological: Negative for syncope and numbness. other than noted Hematological: Negative for adenopathy.  Psychiatric/Behavioral: Negative for hallucinations, self-injury, decreased concentration and agitation.      Objective:   Physical Exam BP 84/62  Pulse 55  Temp(Src) 98.2 F (36.8 C) (Oral)  Ht 6\' 4"  (1.93 m)  Wt 278 lb 6 oz (  126.27 kg)  BMI 33.90 kg/m2  SpO2 95% VS noted,  Constitutional: Pt is oriented to person, place, and time. Appears well-developed and well-nourished.  Head: Normocephalic and atraumatic.  Right Ear: External ear normal.  Left Ear: External ear normal.  Nose: Nose normal.  Mouth/Throat: Oropharynx is clear and moist.  Eyes: Conjunctivae and EOM are normal. Pupils are equal, round, and reactive to light.  Neck: Normal range of motion. Neck supple. No JVD present. No tracheal deviation present.  Cardiovascular: Normal rate,  regular rhythm, normal heart sounds and intact distal pulses.   Pulmonary/Chest: Effort normal and breath sounds normal.  Abdominal: Soft. Bowel sounds are normal. There is no tenderness. No HSM  Musculoskeletal: Normal range of motion. Exhibits no edema.  Lymphadenopathy:  Has no cervical adenopathy.  Neurological: Pt is alert and oriented to person, place, and time. Pt has normal reflexes. No cranial nerve deficit.  Skin: Skin is warm and dry. No rash noted.  Psychiatric:  Has  normal mood and affect. Behavior is normal.     Assessment & Plan:

## 2013-02-09 NOTE — Patient Instructions (Addendum)
You had the new Prevnar pneumonia shot today We can plan on the older pneumovax for next year OK to decrease the metoprolol to 25 mg twice per day Please continue all other medications as before, and refills have been done if requested. Please have the pharmacy call with any other refills you may need. Please continue your efforts at being more active, low cholesterol diet, and weight control. You are otherwise up to date with prevention measures today. Please keep your appointments with your specialists as you may have planned  Please go to the LAB in the Basement (turn left off the elevator) for the tests to be done today You will be contacted by phone if any changes need to be made immediately.  Otherwise, you will receive a letter about your results with an explanation, but please check with MyChart first.  Please return in 1 year for your yearly visit, or sooner if needed, with Lab testing done 3-5 days before

## 2013-02-09 NOTE — Assessment & Plan Note (Signed)
Asymtp, for a1c today 

## 2013-02-13 ENCOUNTER — Encounter: Payer: Self-pay | Admitting: Internal Medicine

## 2013-02-13 ENCOUNTER — Other Ambulatory Visit: Payer: Self-pay | Admitting: *Deleted

## 2013-02-13 DIAGNOSIS — R0981 Nasal congestion: Secondary | ICD-10-CM

## 2013-02-13 MED ORDER — FLUTICASONE PROPIONATE 50 MCG/ACT NA SUSP: 2.0000 | Freq: Every day | NASAL | Status: DC

## 2014-02-11 ENCOUNTER — Telehealth: Payer: Self-pay | Admitting: Gastroenterology

## 2014-02-11 NOTE — Telephone Encounter (Signed)
Patient wants an earlier appt for a colon.  States he has rectal fullness and feels something is there.  He is given an appt for 03/06/14.  His last colon was 01/2012

## 2014-02-21 ENCOUNTER — Telehealth: Payer: Self-pay | Admitting: Internal Medicine

## 2014-02-21 NOTE — Telephone Encounter (Signed)
Received 4 pages of medical records from [facility]. Sent to Dr. Jenny Reichmann. 02/21/14/ss

## 2014-02-22 ENCOUNTER — Other Ambulatory Visit: Payer: Self-pay | Admitting: Internal Medicine

## 2014-03-06 ENCOUNTER — Encounter: Payer: Self-pay | Admitting: Gastroenterology

## 2014-03-06 ENCOUNTER — Telehealth: Payer: Self-pay | Admitting: Pulmonary Disease

## 2014-03-06 ENCOUNTER — Ambulatory Visit (INDEPENDENT_AMBULATORY_CARE_PROVIDER_SITE_OTHER): Payer: BC Managed Care – PPO | Admitting: Gastroenterology

## 2014-03-06 ENCOUNTER — Telehealth: Payer: Self-pay | Admitting: Internal Medicine

## 2014-03-06 VITALS — BP 126/74 | HR 53 | Ht 76.0 in | Wt 292.3 lb

## 2014-03-06 DIAGNOSIS — Z86010 Personal history of colonic polyps: Secondary | ICD-10-CM

## 2014-03-06 DIAGNOSIS — R194 Change in bowel habit: Secondary | ICD-10-CM

## 2014-03-06 DIAGNOSIS — G4733 Obstructive sleep apnea (adult) (pediatric): Secondary | ICD-10-CM

## 2014-03-06 DIAGNOSIS — K5900 Constipation, unspecified: Secondary | ICD-10-CM

## 2014-03-06 NOTE — Telephone Encounter (Signed)
Last OV 01/2009 LMTCB x1

## 2014-03-06 NOTE — Telephone Encounter (Signed)
Patient informed and transferred the patient to Pulmonary.

## 2014-03-06 NOTE — Telephone Encounter (Signed)
Pt came by office to request for Rx for CPAP supplies. Pt states he is need for CPAP replacement parts and needs specific information provided to [place] in order to receive this request. Pt would like Dr Notes faxed to [phone]. He would also like to be contacted and updated on status of his request on his mobile number [phone].

## 2014-03-06 NOTE — Patient Instructions (Addendum)
Increase your water and fiber intake. Call in 1 month to update your condition. You are in the system for a colon recall for 01/2015, you will receive a letter at that time.

## 2014-03-06 NOTE — Progress Notes (Signed)
History of Present Illness: This is a 68 year old male with a change in bowel habits since September. He has mild constipation and rectal pressure. He notes he is drinking less water. Colonoscopy performed in 01/2012 showed 4 small adenomatous polyps, diverticulosis and internal hemorrhoids. Denies weight loss, abdominal pain,  diarrhea, change in stool caliber, melena, hematochezia, nausea, vomiting, dysphagia, reflux symptoms, chest pain.  No Known Allergies Outpatient Prescriptions Prior to Visit  Medication Sig Dispense Refill  . aspirin 81 MG tablet Take 81 mg by mouth daily.      Marland Kitchen atorvastatin (LIPITOR) 40 MG tablet TAKE 1 BY MOUTH DAILY 90 tablet 0  . fluticasone (FLONASE) 50 MCG/ACT nasal spray Place 2 sprays into the nose daily. 16 g 2  . fluticasone (FLONASE) 50 MCG/ACT nasal spray Place 2 sprays into both nostrils daily. 48 g 0  . metoprolol succinate (TOPROL-XL) 50 MG 24 hr tablet Take 1 tablet (50 mg total) by mouth daily. Take with or immediately following a meal. 90 tablet 3  . montelukast (SINGULAIR) 10 MG tablet Take 1 tablet (10 mg total) by mouth daily. 90 tablet 3   No facility-administered medications prior to visit.   Past Medical History  Diagnosis Date  . Diverticulitis   . GERD (gastroesophageal reflux disease)   . HTN (hypertension)   . Sleep apnea     CPAP  . Migraine   . HLD (hyperlipidemia)   . ED (erectile dysfunction)   . Low back pain   . Tubular adenoma of colon 05/2008  . Rotator cuff tear   . Anxiety   . History of herpes genitalis   . BPH (benign prostatic hypertrophy)   . Benign neoplasm of stomach    Past Surgical History  Procedure Laterality Date  . Hernia repair    . Knee surgery      mensicus   History   Social History  . Marital Status: Married    Spouse Name: N/A    Number of Children: 1  . Years of Education: N/A   Occupational History  . sales and marketing Radiographer, therapeutic)    Social History Main Topics  . Smoking  status: Current Some Day Smoker    Types: Cigars  . Smokeless tobacco: Never Used     Comment: occasionally  . Alcohol Use: Yes     Comment: social 2 beers a month  . Drug Use: No  . Sexual Activity: None   Other Topics Concern  . None   Social History Narrative   Family History  Problem Relation Age of Onset  . Colon cancer Mother     58's     Physical Exam: General: Well developed , well nourished, no acute distress Head: Normocephalic and atraumatic Eyes:  sclerae anicteric, EOMI Ears: Normal auditory acuity Mouth: No deformity or lesions Lungs: Clear throughout to auscultation Heart: Regular rate and rhythm; no murmurs, rubs or bruits Abdomen: Soft, non tender and non distended. No masses, hepatosplenomegaly or hernias noted. Normal Bowel sounds Musculoskeletal: Symmetrical with no gross deformities  Pulses:  Normal pulses noted Extremities: No clubbing, cyanosis, edema or deformities noted Neurological: Alert oriented x 4, grossly nonfocal Psychological:  Alert and cooperative. Normal mood and affect  Assessment and Recommendations:  1. Constipation, change in bowel habits. Increase fiber and water intake. If symptoms do not respond by early January add Colace daily and consider colonoscopy.  2. Personal history of colon polyps and family history of colon cancer. Surveillance  colonoscopy recommended 01/2015.

## 2014-03-06 NOTE — Telephone Encounter (Signed)
Very sorry, I must decline as i dont tx OSA, and the original prescription for his tx was NOT from me, but I can refer to pulm as he likely needs followup

## 2014-03-06 NOTE — Telephone Encounter (Signed)
No, since I done normally rx CPAP supplies, but feel free to ask the MD such as his pulmonary who normally treats this

## 2014-03-06 NOTE — Telephone Encounter (Signed)
I have printed this patients sleep study results from 2010 and all they need then is script for new mask/supplies.  Patient stated PCP sent him to [facility] for the original sleep study.  Advise please

## 2014-03-07 NOTE — Telephone Encounter (Signed)
[phone] calling back

## 2014-03-07 NOTE — Telephone Encounter (Signed)
lmtcb for pt.  

## 2014-03-07 NOTE — Telephone Encounter (Signed)
Order placed for new head strap.  Patient notified.  Nothing further needed.

## 2014-03-07 NOTE — Telephone Encounter (Signed)
Called and spoke to pt. Informed pt of the recs per RA. Sleep consult made for 04/16/14 to re-establish. Pt verbalized understanding and stated he will keep appt but is questioning in the mean time- if order can be placed for head strap as his is currently broken and is unable to wear CPAP at this time.   Dr. Elsworth Soho please advise.

## 2014-03-07 NOTE — Telephone Encounter (Signed)
OK to place Rx for head strap

## 2014-03-07 NOTE — Telephone Encounter (Signed)
We only provide Rx to patients whom we see at least once annually. He can ask his PCP to provide RX & take over his CPAP care

## 2014-03-07 NOTE — Telephone Encounter (Addendum)
Spoke with patient. States that he needs a chin strap for his CPAP mask. Pt states that he was advised by Dr John/ Dr Fuller Plan and [facility] that he did not need up to date OV notes for this Rx to be processed. Pt states that he does not see why he needs to come into our office and pay a copay in order to get this "okay for renewal of chin strap" Pt advised that this is not our policy, this is a policy set in place by insurance companies. We have not seen him since 2010 and in order for him to have this RX sent, we would need to see him face-to-face for follow up and also so that insurance will cover the supplies needed.  Pt very frustrated, rude and loud on the phone. States that all he wants is permission from Dr Elsworth Soho to get a chin strap and he does not feel that he "needs" an OV for this.  Pt asks that I run this past Dr Elsworth Soho and see if OV can be exempt or if we need him to "come in and pay a copay to get this okay." Pt refused to schedule at this time  RA-please advise, thanks

## 2014-03-18 ENCOUNTER — Telehealth: Payer: Self-pay | Admitting: Pulmonary Disease

## 2014-03-18 NOTE — Telephone Encounter (Signed)
Per pt's chart, staff message was sent to [facility] w/ [place] on 12.16.15 and the additional info can be pulled from pt's chart by [facility] LMOM TCB x1 for Melissa to check on the status of pt's elastic strap ATC [facility] but was on hold > 2min  LMOM TCB x1 for pt to discuss

## 2014-03-19 NOTE — Telephone Encounter (Signed)
Melissa-ADV HC returning call to [facility]

## 2014-03-19 NOTE — Telephone Encounter (Signed)
Pt returned call Updated him on the info below after speaking with Melissa Pt is aware everything should go smoothly from here unless [facility] needs additional information for insurance purposes - he is also aware to keep his upcoming appt with RA.  Nothing further needed; will sign off.

## 2014-03-19 NOTE — Telephone Encounter (Signed)
lmtcb for Melissa.  

## 2014-03-19 NOTE — Telephone Encounter (Signed)
Melissa w/ [facility] returned call - reports pt is not in their system at all and she never received the order from 12/17 Upon further inspection of patient's chart, was able to locate pt's initial sleep study but no evidence of what DME he has used in the past as epic does not go back to 2010.  Per Lenna Sciara, she will go ahead and process the order and call the office back if any additional information is needed.  Pt is scheduled to re-establish with RA on 1.26.16 LMOM TCB x2 for pt to update him

## 2014-04-10 ENCOUNTER — Other Ambulatory Visit: Payer: Self-pay | Admitting: Internal Medicine

## 2014-04-15 ENCOUNTER — Telehealth: Payer: Self-pay | Admitting: Internal Medicine

## 2014-04-15 NOTE — Telephone Encounter (Signed)
Pt need to make appt last ov 02/09/13...Antonio Reyes

## 2014-04-15 NOTE — Telephone Encounter (Signed)
Pt called in and wanted to know if he can get his Metoprolol 50mg  and Atorvastatian 40mg  refilled and sent to [facility] or does he have to have an appt 1st??      Best number  [phone] work [phone] cell

## 2014-04-16 ENCOUNTER — Ambulatory Visit (INDEPENDENT_AMBULATORY_CARE_PROVIDER_SITE_OTHER): Payer: BLUE CROSS/BLUE SHIELD | Admitting: Pulmonary Disease

## 2014-04-16 ENCOUNTER — Encounter: Payer: Self-pay | Admitting: Pulmonary Disease

## 2014-04-16 ENCOUNTER — Institutional Professional Consult (permissible substitution): Payer: BC Managed Care – PPO | Admitting: Pulmonary Disease

## 2014-04-16 ENCOUNTER — Other Ambulatory Visit (INDEPENDENT_AMBULATORY_CARE_PROVIDER_SITE_OTHER): Payer: BLUE CROSS/BLUE SHIELD

## 2014-04-16 ENCOUNTER — Telehealth: Payer: Self-pay | Admitting: Internal Medicine

## 2014-04-16 VITALS — BP 115/70 | HR 62 | Ht 76.0 in | Wt 292.2 lb

## 2014-04-16 DIAGNOSIS — G4733 Obstructive sleep apnea (adult) (pediatric): Secondary | ICD-10-CM

## 2014-04-16 DIAGNOSIS — Z0189 Encounter for other specified special examinations: Secondary | ICD-10-CM

## 2014-04-16 DIAGNOSIS — Z0000 Encounter for general adult medical examination without abnormal findings: Secondary | ICD-10-CM

## 2014-04-16 LAB — CBC WITH DIFFERENTIAL/PLATELET
BASOS PCT: 0.8 % (ref 0.0–3.0)
Basophils Absolute: 0.1 10*3/uL (ref 0.0–0.1)
Eosinophils Absolute: 0.2 10*3/uL (ref 0.0–0.7)
Eosinophils Relative: 2 % (ref 0.0–5.0)
HCT: 46.1 % (ref 39.0–52.0)
Hemoglobin: 16.3 g/dL (ref 13.0–17.0)
Lymphocytes Relative: 29.8 % (ref 12.0–46.0)
Lymphs Abs: 2.5 10*3/uL (ref 0.7–4.0)
MCHC: 35.3 g/dL (ref 30.0–36.0)
MCV: 88.6 fl (ref 78.0–100.0)
MONOS PCT: 7.6 % (ref 3.0–12.0)
Monocytes Absolute: 0.6 10*3/uL (ref 0.1–1.0)
NEUTROS PCT: 59.8 % (ref 43.0–77.0)
Neutro Abs: 5 10*3/uL (ref 1.4–7.7)
Platelets: 271 10*3/uL (ref 150.0–400.0)
RBC: 5.2 Mil/uL (ref 4.22–5.81)
RDW: 13.1 % (ref 11.5–15.5)
WBC: 8.3 10*3/uL (ref 4.0–10.5)

## 2014-04-16 LAB — URINALYSIS, ROUTINE W REFLEX MICROSCOPIC
BILIRUBIN URINE: NEGATIVE
Hgb urine dipstick: NEGATIVE
KETONES UR: NEGATIVE
LEUKOCYTES UA: NEGATIVE
Nitrite: NEGATIVE
RBC / HPF: NONE SEEN (ref 0–?)
Specific Gravity, Urine: >=1.03 — AB (ref 1.000–1.030)
Total Protein, Urine: NEGATIVE
URINE GLUCOSE: NEGATIVE
UROBILINOGEN UA: 0.2 (ref 0.0–1.0)
pH: 6 (ref 5.0–8.0)

## 2014-04-16 LAB — PSA: PSA: 0.58 ng/mL (ref 0.10–4.00)

## 2014-04-16 LAB — TSH: TSH: 1.99 u[IU]/mL (ref 0.35–4.50)

## 2014-04-16 NOTE — Progress Notes (Signed)
Subjective:    Patient ID: Antonio Reyes, male    DOB: April 04, 1945, 69 y.o.   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 He does not like any other mask interface except pillows. Took ambien & barbiturates in the past, but now no sleep latency problems download 8/25 - 01/22/09 on auto 8-15>> acceptable compliance, pressure 14 cm   Last seen in 2010, presents to reestablish. He has not needed new supplies. Continues to be compliant with C Pap. Epworth sleepiness score 3/24. Bedtime around 10:30 PM, sleep latency minimal, sleeps on his back on his side, reports one to 2 nocturnal awakenings without nocturia, out of bed by 7 AM feeling refreshed, no headaches. He does report mild dryness on the time and wonders if he is a mouth breather. He is lost 15 pounds since his last visit. He is maintained on auto C Pap 8-15 cm   Past Medical History  Diagnosis Date  . Diverticulitis   . GERD (gastroesophageal reflux disease)   . HTN (hypertension)   . Sleep apnea     CPAP  . Migraine   . HLD (hyperlipidemia)   . ED (erectile dysfunction)   . Low back pain   . Tubular adenoma of colon 05/2008  . Rotator cuff tear   . Anxiety   . History of herpes genitalis   . BPH (benign prostatic hypertrophy)   . Benign neoplasm of stomach     Past Surgical History  Procedure Laterality Date  . Hernia repair    . Knee surgery      mensicus    No Known Allergies  History   Social History  . Marital Status: Married    Spouse Name: N/A    Number of Children: 1  . Years of Education: N/A   Occupational History  . sales and marketing Radiographer, therapeutic)    Social History Main Topics  . Smoking status: Current Some Day Smoker    Types: Cigars   . Smokeless tobacco: Never Used     Comment: occasionally  . Alcohol Use: 0.0 oz/week    0 Not specified per week     Comment: social 2 beers a month  . Drug Use: No  . Sexual Activity: Not on file   Other Topics Concern  . Not on file   Social History Narrative    Family History  Problem Relation Age of Onset  . Colon cancer Mother     41's     Review of Systems neg for any significant sore throat, dysphagia, itching, sneezing, nasal congestion or excess/ purulent secretions, fever, chills, sweats, unintended wt loss, pleuritic or exertional cp, hempoptysis, orthopnea pnd or change in chronic leg swelling. Also denies presyncope, palpitations, heartburn, abdominal pain, nausea, vomiting, diarrhea or change in bowel or urinary habits, dysuria,hematuria, rash, arthralgias, visual complaints, headache, numbness weakness or ataxia.     Objective:   Physical Exam  Gen. Pleasant, obese, in no distress, normal affect ENT - no lesions, no post nasal drip, class 2-3 airway Neck: No JVD, no thyromegaly, no carotid bruits Lungs: no use of accessory muscles, no dullness to percussion, decreased without rales or rhonchi  Cardiovascular: Rhythm regular, heart sounds  normal,  no murmurs or gallops, no peripheral edema Abdomen: soft and non-tender, no hepatosplenomegaly, BS normal. Musculoskeletal: No deformities, no cyanosis or clubbing Neuro:  alert, non focal, no tremors       Assessment & Plan:

## 2014-04-16 NOTE — Patient Instructions (Signed)
CPAP supplies will be renewed x 1 year Drop the card tomorrow & we can give you feedback

## 2014-04-16 NOTE — Assessment & Plan Note (Signed)
PSG 2010 - C Pap15 cm- maintained on auto 8-15, pillows  C Pap supplies will be renewed for a year. We will check the download on his current settings, and ensure that pressure is adequate. He may qualify for a new machine soon. We did discuss using a chinstrap Weight loss encouraged, compliance with goal of at least 4-6 hrs every night is the expectation. Advised against medications with sedative side effects Cautioned against driving when sleepy - understanding that sleepiness will vary on a day to day basis

## 2014-04-16 NOTE — Telephone Encounter (Signed)
Ok for labs today,then OV at 3pm in 1-2 days

## 2014-04-17 ENCOUNTER — Encounter: Payer: Self-pay | Admitting: Internal Medicine

## 2014-04-17 ENCOUNTER — Ambulatory Visit (INDEPENDENT_AMBULATORY_CARE_PROVIDER_SITE_OTHER): Payer: BLUE CROSS/BLUE SHIELD | Admitting: Internal Medicine

## 2014-04-17 VITALS — BP 104/70 | HR 63 | Temp 97.7°F | Ht 76.0 in | Wt 292.5 lb

## 2014-04-17 DIAGNOSIS — Z23 Encounter for immunization: Secondary | ICD-10-CM

## 2014-04-17 DIAGNOSIS — I10 Essential (primary) hypertension: Secondary | ICD-10-CM

## 2014-04-17 DIAGNOSIS — F528 Other sexual dysfunction not due to a substance or known physiological condition: Secondary | ICD-10-CM

## 2014-04-17 DIAGNOSIS — Z0000 Encounter for general adult medical examination without abnormal findings: Secondary | ICD-10-CM

## 2014-04-17 DIAGNOSIS — J329 Chronic sinusitis, unspecified: Secondary | ICD-10-CM

## 2014-04-17 LAB — LIPID PANEL
Cholesterol: 123 mg/dL (ref 0–200)
HDL: 38.2 mg/dL — AB (ref >39.00)
LDL CALC: 47 mg/dL (ref 0–99)
NonHDL: 84.8
Total CHOL/HDL Ratio: 3
Triglycerides: 191 mg/dL — ABNORMAL HIGH (ref 0.0–149.0)
VLDL: 38.2 mg/dL (ref 0.0–40.0)

## 2014-04-17 LAB — BASIC METABOLIC PANEL
BUN: 17 mg/dL (ref 6–23)
CO2: 28 mEq/L (ref 19–32)
Calcium: 9.5 mg/dL (ref 8.4–10.5)
Chloride: 106 mEq/L (ref 96–112)
Creatinine, Ser: 1.04 mg/dL (ref 0.40–1.50)
GFR: 75.8 mL/min (ref >60.00)
Glucose, Bld: 93 mg/dL (ref 70–99)
Potassium: 4.2 mEq/L (ref 3.5–5.1)
Sodium: 140 mEq/L (ref 135–145)

## 2014-04-17 LAB — HEPATIC FUNCTION PANEL
ALT: 23 U/L (ref 0–53)
AST: 19 U/L (ref 0–37)
Albumin: 4.2 g/dL (ref 3.5–5.2)
Alkaline Phosphatase: 73 U/L (ref 39–117)
BILIRUBIN DIRECT: 0.1 mg/dL (ref 0.0–0.3)
BILIRUBIN TOTAL: 0.6 mg/dL (ref 0.2–1.2)
TOTAL PROTEIN: 6.7 g/dL (ref 6.0–8.3)

## 2014-04-17 MED ORDER — ATORVASTATIN CALCIUM 40 MG PO TABS: ORAL_TABLET | ORAL | Status: DC

## 2014-04-17 MED ORDER — TADALAFIL 20 MG PO TABS
20.0000 mg | ORAL_TABLET | Freq: Every day | ORAL | Status: DC | PRN
Start: 2014-04-17 — End: 2017-07-12

## 2014-04-17 MED ORDER — METOPROLOL SUCCINATE ER 50 MG PO TB24: 50.0000 mg | ORAL_TABLET | Freq: Every day | ORAL | Status: DC

## 2014-04-17 MED ORDER — FLUTICASONE PROPIONATE 50 MCG/ACT NA SUSP: 2.0000 | Freq: Every day | NASAL | Status: DC

## 2014-04-17 MED ORDER — MONTELUKAST SODIUM 10 MG PO TABS: 10.0000 mg | ORAL_TABLET | Freq: Every day | ORAL | Status: DC

## 2014-04-17 NOTE — Progress Notes (Signed)
Pre visit review using our clinic review tool, if applicable. No additional management support is needed unless otherwise documented below in the visit note. 

## 2014-04-17 NOTE — Progress Notes (Signed)
Subjective:    Patient ID: Antonio Reyes, male    DOB: [birth year], 69 y.o.   MRN: [id number]  HPI  Here for wellness and f/u;  Overall doing ok;  Pt denies CP, worsening SOB, DOE, wheezing, orthopnea, PND, worsening LE edema, palpitations, dizziness or syncope.  Pt denies neurological change such as new headache, facial or extremity weakness.  Pt denies polydipsia, polyuria, or low sugar symptoms. Pt states overall good compliance with treatment and medications, good tolerability, and has been trying to follow lower cholesterol diet.  Pt denies worsening depressive symptoms, suicidal ideation or panic. No fever, night sweats, wt loss, loss of appetite, or other constitutional symptoms.  Pt states good ability with ADL's, has low fall risk, home safety reviewed and adequate, no other significant changes in hearing or vision, and only occasionally active with exercise.  No current complaints except asks for cialis for recent worsening ED symptoms Past Medical History  Diagnosis Date  . Diverticulitis   . GERD (gastroesophageal reflux disease)   . HTN (hypertension)   . Sleep apnea     CPAP  . Migraine   . HLD (hyperlipidemia)   . ED (erectile dysfunction)   . Low back pain   . Tubular adenoma of colon 05/2008  . Rotator cuff tear   . Anxiety   . History of herpes genitalis   . BPH (benign prostatic hypertrophy)   . Benign neoplasm of stomach    Past Surgical History  Procedure Laterality Date  . Hernia repair    . Knee surgery      mensicus    reports that he has been smoking Cigars.  He has never used smokeless tobacco. He reports that he drinks alcohol. He reports that he does not use illicit drugs. family history includes Colon cancer in his mother. No Known Allergies Current Outpatient Prescriptions on File Prior to Visit  Medication Sig Dispense Refill  . aspirin 81 MG tablet Take 81 mg by mouth daily.       No current facility-administered medications on file prior to  visit.   Review of Systems Constitutional: Negative for increased diaphoresis, other activity, appetite or other siginficant weight change  HENT: Negative for worsening hearing loss, ear pain, facial swelling, mouth sores and neck stiffness.   Eyes: Negative for other worsening pain, redness or visual disturbance.  Respiratory: Negative for shortness of breath and wheezing.   Cardiovascular: Negative for chest pain and palpitations.  Gastrointestinal: Negative for diarrhea, blood in stool, abdominal distention or other pain Genitourinary: Negative for hematuria, flank pain or change in urine volume.  Musculoskeletal: Negative for myalgias or other joint complaints.  Skin: Negative for color change and wound.  Neurological: Negative for syncope and numbness. other than noted Hematological: Negative for adenopathy. or other swelling Psychiatric/Behavioral: Negative for hallucinations, self-injury, decreased concentration or other worsening agitation.      Objective:   Physical Exam BP 104/70 mmHg  Pulse 63  Temp(Src) 97.7 F (36.5 C) (Oral)  Ht 6\' 4"  (1.93 m)  Wt 292 lb 8 oz (132.677 kg)  BMI 35.62 kg/m2  SpO2 95% VS noted,  Constitutional: Pt is oriented to person, place, and time. Appears well-developed and well-nourished.  Head: Normocephalic and atraumatic.  Right Ear: External ear normal.  Left Ear: External ear normal.  Nose: Nose normal.  Mouth/Throat: Oropharynx is clear and moist.  Eyes: Conjunctivae and EOM are normal. Pupils are equal, round, and reactive to light.  Neck: Normal range of motion.  Neck supple. No JVD present. No tracheal deviation present.  Cardiovascular: Normal rate, regular rhythm, normal heart sounds and intact distal pulses.   Pulmonary/Chest: Effort normal and breath sounds without rales or wheezing  Abdominal: Soft. Bowel sounds are normal. NT. No HSM  Musculoskeletal: Normal range of motion. Exhibits no edema.  Lymphadenopathy:  Has no cervical  adenopathy.  Neurological: Pt is alert and oriented to person, place, and time. Pt has normal reflexes. No cranial nerve deficit. Motor grossly intact Skin: Skin is warm and dry. No rash noted.  Psychiatric:  Has normal mood and affect. Behavior is normal.  Wt Readings from Last 3 Encounters:  04/17/14 292 lb 8 oz (132.677 kg)  04/16/14 292 lb 3.2 oz (132.541 kg)  03/06/14 292 lb 4.8 oz (132.586 kg)        Assessment & Plan:

## 2014-04-17 NOTE — Patient Instructions (Addendum)
You had the Pneumovax shot today  Please continue all other medications as before, and refills have been done if requested - cialis  Please have the pharmacy call with any other refills you may need.  Please continue your efforts at being more active, low cholesterol diet, and weight control.  You are otherwise up to date with prevention measures today.  Please keep your appointments with your specialists as you may have planned  Please return in 1 year for your yearly visit, or sooner if needed, with Lab testing done 3-5 days before

## 2014-04-17 NOTE — Assessment & Plan Note (Signed)

## 2014-04-18 ENCOUNTER — Telehealth: Payer: Self-pay | Admitting: Internal Medicine

## 2014-04-18 ENCOUNTER — Telehealth: Payer: Self-pay | Admitting: Pulmonary Disease

## 2014-04-18 NOTE — Telephone Encounter (Signed)
CPAP 13 cmp - works well. Compliance poor - missed several days.

## 2014-04-18 NOTE — Telephone Encounter (Signed)
emmi mailed  °

## 2014-04-18 NOTE — Telephone Encounter (Signed)
Patient notified.  No questions or concerns at this time. Nothing further needed.   

## 2014-04-21 NOTE — Assessment & Plan Note (Signed)
Ok for cialis asd,  to f/u any worsening symptoms or concerns

## 2014-04-21 NOTE — Assessment & Plan Note (Signed)
stable overall by history and exam, recent data reviewed with pt, and pt to continue medical treatment as before,  to f/u any worsening symptoms or concerns BP Readings from Last 3 Encounters:  04/17/14 104/70  04/16/14 115/70  03/06/14 126/74

## 2014-04-26 ENCOUNTER — Telehealth: Payer: Self-pay | Admitting: Internal Medicine

## 2014-04-26 NOTE — Telephone Encounter (Signed)
Pt called wondering if pneumonia injection can cause voice lost. Please advise.

## 2014-04-27 NOTE — Telephone Encounter (Signed)
No, this would be a typical side effect.

## 2014-04-29 ENCOUNTER — Encounter: Payer: Self-pay | Admitting: Nurse Practitioner

## 2014-04-29 ENCOUNTER — Ambulatory Visit (INDEPENDENT_AMBULATORY_CARE_PROVIDER_SITE_OTHER): Payer: BLUE CROSS/BLUE SHIELD | Admitting: Nurse Practitioner

## 2014-04-29 VITALS — BP 120/78 | HR 63 | Temp 98.1°F | Ht 76.0 in | Wt 291.0 lb

## 2014-04-29 DIAGNOSIS — J0110 Acute frontal sinusitis, unspecified: Secondary | ICD-10-CM

## 2014-04-29 MED ORDER — AMOXICILLIN-POT CLAVULANATE 875-125 MG PO TABS: 1.0000 | ORAL_TABLET | Freq: Two times a day (BID) | ORAL | Status: DC

## 2014-04-29 NOTE — Progress Notes (Signed)
Pre visit review using our clinic review tool, if applicable. No additional management support is needed unless otherwise documented below in the visit note. 

## 2014-04-29 NOTE — Patient Instructions (Signed)
Start daily sinus rinses (Neilmed Sinus rinse) use twice daily for at least 5-7 days.  If no improvement after 2 days, Start antibiotic. Eat yogurt daily at lunch or afternoon to help prevent diarrhea that can be caused by antibiotic.  Stop using over the counter cold medicines. You may continue flonase. Cough will improve as sinus drainage clears.  Please call for re-evaluation if you are not improving.   Sinusitis Sinusitis is redness, soreness, and swelling (inflammation) of the paranasal sinuses. Paranasal sinuses are air pockets within the bones of your face (beneath the eyes, the middle of the forehead, or above the eyes). In healthy paranasal sinuses, mucus is able to drain out, and air is able to circulate through them by way of your nose. However, when your paranasal sinuses are inflamed, mucus and air can become trapped. This can allow bacteria and other germs to grow and cause infection. Sinusitis can develop quickly and last only a short time (acute) or continue over a long period (chronic). Sinusitis that lasts for more than 12 weeks is considered chronic.  CAUSES  Causes of sinusitis include:  Allergies.  Structural abnormalities, such as displacement of the cartilage that separates your nostrils (deviated septum), which can decrease the air flow through your nose and sinuses and affect sinus drainage.  Functional abnormalities, such as when the small hairs (cilia) that line your sinuses and help remove mucus do not work properly or are not present. SYMPTOMS  Symptoms of acute and chronic sinusitis are the same. The primary symptoms are pain and pressure around the affected sinuses. Other symptoms include:  Upper toothache.  Earache.  Headache.  Bad breath.  Decreased sense of smell and taste.  A cough, which worsens when you are lying flat.  Fatigue.  Fever.  Thick drainage from your nose, which often is green and may contain pus (purulent).  Swelling and warmth  over the affected sinuses. DIAGNOSIS  Your caregiver will perform a physical exam. During the exam, your caregiver may:  Look in your nose for signs of abnormal growths in your nostrils (nasal polyps).  Tap over the affected sinus to check for signs of infection.  View the inside of your sinuses (endoscopy) with a special imaging device with a light attached (endoscope), which is inserted into your sinuses. If your caregiver suspects that you have chronic sinusitis, one or more of the following tests may be recommended:  Allergy tests.  Nasal culture A sample of mucus is taken from your nose and sent to a lab and screened for bacteria.  Nasal cytology A sample of mucus is taken from your nose and examined by your caregiver to determine if your sinusitis is related to an allergy. TREATMENT  Most cases of acute sinusitis are related to a viral infection and will resolve on their own within 10 days. Sometimes medicines are prescribed to help relieve symptoms (pain medicine, decongestants, nasal steroid sprays, or saline sprays).  However, for sinusitis related to a bacterial infection, your caregiver will prescribe antibiotic medicines. These are medicines that will help kill the bacteria causing the infection.  Rarely, sinusitis is caused by a fungal infection. In theses cases, your caregiver will prescribe antifungal medicine. For some cases of chronic sinusitis, surgery is needed. Generally, these are cases in which sinusitis recurs more than 3 times per year, despite other treatments. HOME CARE INSTRUCTIONS   Drink plenty of water. Water helps thin the mucus so your sinuses can drain more easily.  Use a humidifier.  Inhale steam 3 to 4 times a day (for example, sit in the bathroom with the shower running).  Apply a warm, moist washcloth to your face 3 to 4 times a day, or as directed by your caregiver.  Use saline nasal sprays to help moisten and clean your sinuses.  Take  over-the-counter or prescription medicines for pain, discomfort, or fever only as directed by your caregiver. SEEK IMMEDIATE MEDICAL CARE IF:  You have increasing pain or severe headaches.  You have nausea, vomiting, or drowsiness.  You have swelling around your face.  You have vision problems.  You have a stiff neck.  You have difficulty breathing. MAKE SURE YOU:   Understand these instructions.  Will watch your condition.  Will get help right away if you are not doing well or get worse. Document Released: 03/08/2005 Document Revised: 05/31/2011 Document Reviewed: 03/23/2011 [facility] Patient Information 2014 [place], [place].

## 2014-04-29 NOTE — Progress Notes (Signed)
   Subjective:    Patient ID: Antonio Reyes, male    DOB: 12-11-1945, 69 y.o.   MRN: [id number]  Cough This is a new problem. The current episode started 1 to 4 weeks ago (10 days). The problem has been unchanged. The problem occurs every few hours. The cough is non-productive. Associated symptoms include nasal congestion, postnasal drip and a sore throat. Pertinent negatives include no chest pain, chills, ear congestion, ear pain, fever, headaches or shortness of breath. Nothing aggravates the symptoms. He has tried OTC cough suppressant (flonase mucinex, sudafed) for the symptoms. The treatment provided no relief. recurrent sinusitis      Review of Systems  Constitutional: Negative for fever, chills and fatigue.  HENT: Positive for congestion, postnasal drip and sore throat. Negative for ear pain.   Respiratory: Positive for cough. Negative for chest tightness and shortness of breath.   Cardiovascular: Negative for chest pain.  Neurological: Negative for headaches.       Objective:   Physical Exam  Constitutional: He is oriented to person, place, and time. He appears well-developed and well-nourished. No distress.  HENT:  Head: Normocephalic and atraumatic.  Right Ear: External ear normal.  Left Ear: External ear normal.  Mouth/Throat: Oropharynx is clear and moist. No oropharyngeal exudate.  Eyes: Conjunctivae are normal. Right eye exhibits no discharge. Left eye exhibits no discharge.  Neck: Normal range of motion. Neck supple. No thyromegaly present.  Cardiovascular: Normal rate, regular rhythm and normal heart sounds.   No murmur heard. Pulmonary/Chest: Effort normal and breath sounds normal.  Lymphadenopathy:    He has no cervical adenopathy.  Neurological: He is alert and oriented to person, place, and time.  Skin: Skin is warm and dry.  Psychiatric: He has a normal mood and affect. His behavior is normal. Thought content normal.  Vitals reviewed.           Assessment & Plan:  1. Acute frontal sinusitis, recurrence not specified See instructions for symptom support - amoxicillin-clavulanate (AUGMENTIN) 875-125 MG per tablet; Take 1 tablet by mouth 2 (two) times daily.  Dispense: 10 tablet; Refill: 0 F/u PRN

## 2014-04-29 NOTE — Telephone Encounter (Signed)
Called pt no answer LMOM with md response.../lmb 

## 2014-05-02 ENCOUNTER — Telehealth: Payer: Self-pay | Admitting: *Deleted

## 2014-05-02 NOTE — Telephone Encounter (Signed)
Patient notified. Patient expressed understanding.  

## 2014-05-02 NOTE — Telephone Encounter (Signed)
Patient called and wanted to know what the med amoxicillin side effects are? Patient said that he has been having pain in the left kidney area since starting the antibiotic. Please advise?

## 2014-05-02 NOTE — Telephone Encounter (Signed)
Flank pain is not an expected side effect of amoxicillin. If pain is persistent, he should be seen.

## 2014-08-26 ENCOUNTER — Telehealth: Payer: Self-pay | Admitting: Internal Medicine

## 2014-08-26 NOTE — Telephone Encounter (Signed)
Received records from [facility] forwarded to Dr. Cathlean Cower 08/26/14 fbg.

## 2015-01-01 ENCOUNTER — Ambulatory Visit (INDEPENDENT_AMBULATORY_CARE_PROVIDER_SITE_OTHER): Payer: BLUE CROSS/BLUE SHIELD | Admitting: Family Medicine

## 2015-01-01 ENCOUNTER — Encounter: Payer: Self-pay | Admitting: Family Medicine

## 2015-01-01 VITALS — BP 139/92 | HR 66 | Temp 98.4°F | Resp 16 | Ht 76.0 in | Wt 298.0 lb

## 2015-01-01 DIAGNOSIS — J209 Acute bronchitis, unspecified: Secondary | ICD-10-CM | POA: Diagnosis not present

## 2015-01-01 DIAGNOSIS — J069 Acute upper respiratory infection, unspecified: Secondary | ICD-10-CM | POA: Diagnosis not present

## 2015-01-01 MED ORDER — PREDNISONE 20 MG PO TABS: ORAL_TABLET | ORAL | Status: DC

## 2015-01-01 MED ORDER — ALBUTEROL SULFATE HFA 108 (90 BASE) MCG/ACT IN AERS: 2.0000 | INHALATION_SPRAY | RESPIRATORY_TRACT | Status: DC | PRN

## 2015-01-01 NOTE — Patient Instructions (Signed)
Use OTC mucinex DM as directed on packaging. Continue your flonase.

## 2015-01-01 NOTE — Progress Notes (Signed)
Pre visit review using our clinic review tool, if applicable. No additional management support is needed unless otherwise documented below in the visit note. 

## 2015-01-01 NOTE — Progress Notes (Signed)
OFFICE NOTE  01/01/2015  CC:  Chief Complaint  Patient presents with  . URI    x 9 days, recent travel to [place]   HPI: Patient is a 69 y.o. Caucasian male who is here for 8-9d of nasal congestion, mild scratchy/ST, coughing quite a bit over the last 3-4 days.  Cough worse sitting up.  No fevers.  + "sinus" HA.  Mostly dry cough. No face pain or upper teeth pain.  Meds tried: neti pot, flonase, ibup, mucinex. He is not a smoker.  Has had some chest tightness and mild intermittent SOB, question of wheezing (felt more out of breath than usual when snorkling recently in [place]).  Pertinent PMH:  PMH, PSH, and PSH reviewed.  MEDS: Not taking augmentin listed below Outpatient Prescriptions Prior to Visit  Medication Sig Dispense Refill  . aspirin 81 MG tablet Take 81 mg by mouth daily.      Marland Kitchen atorvastatin (LIPITOR) 40 MG tablet TAKE 1 BY MOUTH DAILY 90 tablet 3  . fluticasone (FLONASE) 50 MCG/ACT nasal spray Place 2 sprays into both nostrils daily. 48 g 3  . metoprolol succinate (TOPROL-XL) 50 MG 24 hr tablet Take 1 tablet (50 mg total) by mouth daily. Take with or immediately following a meal. 90 tablet 3  . tadalafil (CIALIS) 20 MG tablet Take 1 tablet (20 mg total) by mouth daily as needed for erectile dysfunction. 10 tablet 11  . amoxicillin-clavulanate (AUGMENTIN) 875-125 MG per tablet Take 1 tablet by mouth 2 (two) times daily. (Patient not taking: Reported on 01/01/2015) 10 tablet 0  . montelukast (SINGULAIR) 10 MG tablet Take 1 tablet (10 mg total) by mouth daily. (Patient not taking: Reported on 01/01/2015) 90 tablet 3   No facility-administered medications prior to visit.    PE: Blood pressure 139/92, pulse 66, temperature 98.4 F (36.9 C), temperature source Oral, resp. rate 16, height 6\' 4"  (1.93 m), weight 298 lb (135.172 kg), SpO2 95 %. VS: noted--normal. Gen: alert, NAD, NONTOXIC APPEARING. HEENT: eyes without injection, drainage, or swelling.  Ears: EACs  clear, TMs with normal light reflex and landmarks.  Nose: Clear rhinorrhea, with some dried, crusty exudate adherent to mildly injected mucosa.  No purulent d/c.  No paranasal sinus TTP.  No facial swelling.  Throat and mouth without focal lesion.  No pharyngial swelling, erythema, or exudate.   Neck: supple, no LAD.   LUNGS: CTA bilat, nonlabored resps.   CV: RRR, no m/r/g. EXT: no c/c/e SKIN: no rash  IMPRESSION AND PLAN:  URI with bronchitis. Suspect viral etiology. Prednisone 40mg  qd x 5d, then 20mg  qd x 5d. ProAir HFA 1-2 p q4hh prn.  CMA did inhaler education with pt today. Mucinex DM OTC recommended, and continue daily flonase.  An After Visit Summary was printed and given to the patient.  FOLLOW UP: prn

## 2015-02-03 ENCOUNTER — Encounter: Payer: Self-pay | Admitting: Gastroenterology

## 2015-02-04 ENCOUNTER — Encounter: Payer: Self-pay | Admitting: Gastroenterology

## 2015-03-12 ENCOUNTER — Ambulatory Visit (AMBULATORY_SURGERY_CENTER): Payer: Self-pay | Admitting: *Deleted

## 2015-03-12 VITALS — Ht 76.0 in | Wt 306.6 lb

## 2015-03-12 DIAGNOSIS — Z800 Family history of malignant neoplasm of digestive organs: Secondary | ICD-10-CM

## 2015-03-12 DIAGNOSIS — Z86010 Personal history of colonic polyps: Secondary | ICD-10-CM

## 2015-03-12 MED ORDER — NA SULFATE-K SULFATE-MG SULF 17.5-3.13-1.6 GM/177ML PO SOLN: ORAL | Status: DC

## 2015-03-12 NOTE — Progress Notes (Signed)
Patient denies any allergies to eggs or soy. Patient denies any problems with anesthesia/sedation. Patient denies any oxygen use at home and does not take any diet/weight loss medications. Patient declined EMMI information today.  

## 2015-03-26 ENCOUNTER — Telehealth: Payer: Self-pay | Admitting: Gastroenterology

## 2015-03-26 NOTE — Telephone Encounter (Signed)
Patient advised to take delsym or other cough medicine with no red coloring.

## 2015-03-27 ENCOUNTER — Ambulatory Visit (AMBULATORY_SURGERY_CENTER): Payer: BLUE CROSS/BLUE SHIELD | Admitting: Gastroenterology

## 2015-03-27 ENCOUNTER — Encounter: Payer: Self-pay | Admitting: Gastroenterology

## 2015-03-27 VITALS — BP 114/76 | HR 59 | Temp 96.1°F | Resp 16 | Ht 76.0 in | Wt 306.0 lb

## 2015-03-27 DIAGNOSIS — Z86010 Personal history of colonic polyps: Secondary | ICD-10-CM | POA: Diagnosis present

## 2015-03-27 DIAGNOSIS — D124 Benign neoplasm of descending colon: Secondary | ICD-10-CM | POA: Diagnosis not present

## 2015-03-27 DIAGNOSIS — D122 Benign neoplasm of ascending colon: Secondary | ICD-10-CM

## 2015-03-27 DIAGNOSIS — D120 Benign neoplasm of cecum: Secondary | ICD-10-CM

## 2015-03-27 DIAGNOSIS — Z800 Family history of malignant neoplasm of digestive organs: Secondary | ICD-10-CM

## 2015-03-27 DIAGNOSIS — K5790 Diverticulosis of intestine, part unspecified, without perforation or abscess without bleeding: Secondary | ICD-10-CM

## 2015-03-27 HISTORY — PX: COLONOSCOPY: SHX174

## 2015-03-27 HISTORY — DX: Diverticulosis of intestine, part unspecified, without perforation or abscess without bleeding: K57.90

## 2015-03-27 MED ORDER — SODIUM CHLORIDE 0.9 % IV SOLN: 500.0000 mL | INTRAVENOUS | Status: DC

## 2015-03-27 NOTE — Patient Instructions (Signed)
YOU HAD AN ENDOSCOPIC PROCEDURE TODAY AT THE  ENDOSCOPY CENTER:   Refer to the procedure report that was given to you for any specific questions about what was found during the examination.  If the procedure report does not answer your questions, please call your gastroenterologist to clarify.  If you requested that your care partner not be given the details of your procedure findings, then the procedure report has been included in a sealed envelope for you to review at your convenience later.  YOU SHOULD EXPECT: Some feelings of bloating in the abdomen. Passage of more gas than usual.  Walking can help get rid of the air that was put into your GI tract during the procedure and reduce the bloating. If you had a lower endoscopy (such as a colonoscopy or flexible sigmoidoscopy) you may notice spotting of blood in your stool or on the toilet paper. If you underwent a bowel prep for your procedure, you may not have a normal bowel movement for a few days.  Please Note:  You might notice some irritation and congestion in your nose or some drainage.  This is from the oxygen used during your procedure.  There is no need for concern and it should clear up in a day or so.  SYMPTOMS TO REPORT IMMEDIATELY:   Following lower endoscopy (colonoscopy or flexible sigmoidoscopy):  Excessive amounts of blood in the stool  Significant tenderness or worsening of abdominal pains  Swelling of the abdomen that is new, acute  Fever of 100F or higher   For urgent or emergent issues, a gastroenterologist can be reached at any hour by calling [phone].   DIET: Your first meal following the procedure should be a small meal and then it is ok to progress to your normal diet. Heavy or fried foods are harder to digest and may make you feel nauseous or bloated.  Likewise, meals heavy in dairy and vegetables can increase bloating.  Drink plenty of fluids but you should avoid alcoholic beverages for 24  hours.  ACTIVITY:  You should plan to take it easy for the rest of today and you should NOT DRIVE or use heavy machinery until tomorrow (because of the sedation medicines used during the test).    FOLLOW UP: Our staff will call the number listed on your records the next business day following your procedure to check on you and address any questions or concerns that you may have regarding the information given to you following your procedure. If we do not reach you, we will leave a message.  However, if you are feeling well and you are not experiencing any problems, there is no need to return our call.  We will assume that you have returned to your regular daily activities without incident.  If any biopsies were taken you will be contacted by phone or by letter within the next 1-3 weeks.  Please call us at [phone] if you have not heard about the biopsies in 3 weeks.    SIGNATURES/CONFIDENTIALITY: You and/or your care partner have signed paperwork which will be entered into your electronic medical record.  These signatures attest to the fact that that the information above on your After Visit Summary has been reviewed and is understood.  Full responsibility of the confidentiality of this discharge information lies with you and/or your care-partner. 

## 2015-03-27 NOTE — Op Note (Signed)
[facility]  [address] [place], [id number]   COLONOSCOPY PROCEDURE REPORT  PATIENT: Teryl, Pezzano  MR#: [id number] BIRTHDATE: 15-Sep-1945 , 22  yrs. old GENDER: male ENDOSCOPIST: Ladene Artist, MD, Jolyn Lent PROCEDURE DATE:  03/27/2015 PROCEDURE:   Colonoscopy, surveillance and Colonoscopy with snare polypectomy First Screening Colonoscopy - Avg.  risk and is 50 yrs.  old or older - No.  Prior Negative Screening - Now for repeat screening. N/A  History of Adenoma - Now for follow-up colonoscopy & has been > or = to 3 yrs.  Yes hx of adenoma.  Has been 3 or more years since last colonoscopy.  Polyps removed today? Yes ASA CLASS:   Class II INDICATIONS:Surveillance due to prior colonic neoplasia, PH Colon Adenoma, and FH Colon or Rectal Adenocarcinoma. MEDICATIONS: Monitored anesthesia care and Propofol 250 mg IV DESCRIPTION OF PROCEDURE:   After the risks benefits and alternatives of the procedure were thoroughly explained, informed consent was obtained.  The digital rectal exam revealed no abnormalities of the rectum.   The LB [id number]  endoscope was introduced through the anus and advanced to the cecum, which was identified by both the appendix and ileocecal valve. No adverse events experienced.   The quality of the prep was excellent. (Suprep was used)  The instrument was then slowly withdrawn as the colon was fully examined. Estimated blood loss is zero unless otherwise noted in this procedure report.    COLON FINDINGS: Three sessile polyps measuring 6 mm in size were found in the descending colon, ascending colon, and at the cecum. Polypectomies were performed with a cold snare.  The resection was complete, the polyp tissue was completely retrieved and sent to histology.   There was moderate diverticulosis noted in the sigmoid colon, descending colon, and transverse colon.   The examination was otherwise normal.  Retroflexed views revealed  internal Grade I hemorrhoids. The time to cecum = 1.5 Withdrawal time = 10.4   The scope was withdrawn and the procedure completed. COMPLICATIONS: There were no immediate complications.  ENDOSCOPIC IMPRESSION: 1.   Three sessile polyps in the descending, ascending colon, and at the cecum; polypectomies performed with a cold snare 2.   Moderate diverticulosis in the sigmoid colon, descending colon, and transverse colon 3.   Grade l internal hemorrhoids  RECOMMENDATIONS: 1.  Await pathology results 2.  High fiber diet with liberal fluid intake. 3.  Repeat Colonoscopy in 3 years.  eSigned:  Ladene Artist, MD, [facility] 03/27/2015 11:21 AM

## 2015-03-27 NOTE — Progress Notes (Signed)
A/ox3 pleased with MAC, report to Karen RN 

## 2015-03-27 NOTE — Progress Notes (Signed)
Called to room to assist during endoscopic procedure.  Patient ID and intended procedure confirmed with present staff. Received instructions for my participation in the procedure from the performing physician.  

## 2015-03-28 ENCOUNTER — Telehealth: Payer: Self-pay

## 2015-03-28 NOTE — Telephone Encounter (Signed)
  Follow up Call-  Call back number 03/27/2015  Post procedure Call Back phone  # [phone]  Permission to leave phone message Yes     Patient questions:  Do you have a fever, pain , or abdominal swelling? No. Pain Score  0 *  Have you tolerated food without any problems? Yes.    Have you been able to return to your normal activities? Yes.    Do you have any questions about your discharge instructions: Diet   No. Medications  No. Follow up visit  No.  Do you have questions or concerns about your Care? No.  Actions: * If pain score is 4 or above: No action needed, pain <4.

## 2015-04-01 ENCOUNTER — Encounter: Payer: Self-pay | Admitting: Gastroenterology

## 2015-04-15 ENCOUNTER — Other Ambulatory Visit: Payer: Self-pay | Admitting: Internal Medicine

## 2015-04-15 ENCOUNTER — Other Ambulatory Visit (INDEPENDENT_AMBULATORY_CARE_PROVIDER_SITE_OTHER): Payer: BLUE CROSS/BLUE SHIELD

## 2015-04-15 DIAGNOSIS — Z0000 Encounter for general adult medical examination without abnormal findings: Secondary | ICD-10-CM | POA: Diagnosis not present

## 2015-04-15 LAB — LIPID PANEL
Cholesterol: 118 mg/dL (ref 0–200)
HDL: 37.6 mg/dL — AB (ref >39.00)
LDL Cholesterol: 48 mg/dL (ref 0–99)
NonHDL: 80.88
TRIGLYCERIDES: 165 mg/dL — AB (ref 0.0–149.0)
Total CHOL/HDL Ratio: 3
VLDL: 33 mg/dL (ref 0.0–40.0)

## 2015-04-15 LAB — BASIC METABOLIC PANEL
BUN: 16 mg/dL (ref 6–23)
CHLORIDE: 104 meq/L (ref 96–112)
CO2: 27 meq/L (ref 19–32)
CREATININE: 1.34 mg/dL (ref 0.40–1.50)
Calcium: 9.6 mg/dL (ref 8.4–10.5)
GFR: 56.41 mL/min — ABNORMAL LOW (ref >60.00)
Glucose, Bld: 98 mg/dL (ref 70–99)
POTASSIUM: 5.4 meq/L — AB (ref 3.5–5.1)
Sodium: 140 mEq/L (ref 135–145)

## 2015-04-15 LAB — HEPATIC FUNCTION PANEL
ALT: 30 U/L (ref 0–53)
AST: 19 U/L (ref 0–37)
Albumin: 4.4 g/dL (ref 3.5–5.2)
Alkaline Phosphatase: 78 U/L (ref 39–117)
BILIRUBIN DIRECT: 0.2 mg/dL (ref 0.0–0.3)
BILIRUBIN TOTAL: 0.8 mg/dL (ref 0.2–1.2)
TOTAL PROTEIN: 6.9 g/dL (ref 6.0–8.3)

## 2015-04-15 LAB — CBC WITH DIFFERENTIAL/PLATELET
BASOS PCT: 0.5 % (ref 0.0–3.0)
Basophils Absolute: 0 10*3/uL (ref 0.0–0.1)
EOS ABS: 0.2 10*3/uL (ref 0.0–0.7)
Eosinophils Relative: 2.5 % (ref 0.0–5.0)
HCT: 48.3 % (ref 39.0–52.0)
HEMOGLOBIN: 16.2 g/dL (ref 13.0–17.0)
Lymphocytes Relative: 31.3 % (ref 12.0–46.0)
Lymphs Abs: 2.8 10*3/uL (ref 0.7–4.0)
MCHC: 33.6 g/dL (ref 30.0–36.0)
MCV: 87.8 fl (ref 78.0–100.0)
MONO ABS: 0.8 10*3/uL (ref 0.1–1.0)
Monocytes Relative: 8.7 % (ref 3.0–12.0)
Neutro Abs: 5.2 10*3/uL (ref 1.4–7.7)
Neutrophils Relative %: 57 % (ref 43.0–77.0)
Platelets: 265 10*3/uL (ref 150.0–400.0)
RBC: 5.49 Mil/uL (ref 4.22–5.81)
RDW: 13.9 % (ref 11.5–15.5)
WBC: 9.1 10*3/uL (ref 4.0–10.5)

## 2015-04-15 LAB — URINALYSIS, ROUTINE W REFLEX MICROSCOPIC
Bilirubin Urine: NEGATIVE
Hgb urine dipstick: NEGATIVE
KETONES UR: NEGATIVE
Leukocytes, UA: NEGATIVE
NITRITE: NEGATIVE
PH: 6.5 (ref 5.0–8.0)
RBC / HPF: NONE SEEN (ref 0–?)
SPECIFIC GRAVITY, URINE: 1.015 (ref 1.000–1.030)
Total Protein, Urine: NEGATIVE
URINE GLUCOSE: NEGATIVE
Urobilinogen, UA: 0.2 (ref 0.0–1.0)
WBC, UA: NONE SEEN (ref 0–?)

## 2015-04-15 LAB — PSA: PSA: 0.96 ng/mL (ref 0.10–4.00)

## 2015-04-15 LAB — TSH: TSH: 1.16 u[IU]/mL (ref 0.35–4.50)

## 2015-04-22 ENCOUNTER — Encounter: Payer: Self-pay | Admitting: Internal Medicine

## 2015-04-22 ENCOUNTER — Ambulatory Visit (INDEPENDENT_AMBULATORY_CARE_PROVIDER_SITE_OTHER): Payer: BLUE CROSS/BLUE SHIELD | Admitting: Internal Medicine

## 2015-04-22 VITALS — BP 126/80 | HR 60 | Temp 98.7°F | Resp 20 | Ht 76.0 in | Wt 305.0 lb

## 2015-04-22 DIAGNOSIS — Z0000 Encounter for general adult medical examination without abnormal findings: Secondary | ICD-10-CM

## 2015-04-22 NOTE — Patient Instructions (Addendum)
Your EKG was OK today  Please continue all other medications as before, and refills have been done if requested.  Please have the pharmacy call with any other refills you may need.  Please continue your efforts at being more active, low cholesterol diet, and weight control.  You are otherwise up to date with prevention measures today.  Please keep your appointments with your specialists as you may have planned  Please call if you would want the referral to General Surgury for the Bariatric surgury  Please return in 1 year for your yearly visit, or sooner if needed, with Lab testing done 3-5 days before

## 2015-04-22 NOTE — Progress Notes (Signed)
Subjective:    Patient ID: Antonio Reyes, male    DOB: 12-Mar-1946, 70 y.o.   MRN: [id number]  HPI  Here for wellness and f/u;  Overall doing ok;  Pt denies Chest pain, worsening SOB, DOE, wheezing, orthopnea, PND, worsening LE edema, palpitations, dizziness or syncope.  Pt denies neurological change such as new headache, facial or extremity weakness.  Pt denies polydipsia, polyuria, or low sugar symptoms. Pt states overall good compliance with treatment and medications, good tolerability, and has been trying to follow appropriate diet.  Pt denies worsening depressive symptoms, suicidal ideation or panic. No fever, night sweats, wt loss, loss of appetite, or other constitutional symptoms.  Pt states good ability with ADL's, has low fall risk, home safety reviewed and adequate, no other significant changes in hearing or vision, and only occasionally active with exercise.  Declines flu shot.  May want to ask for gen surgury referral for bariatric surgury and willing to pay cash, wants to check with wife first Past Medical History  Diagnosis Date  . Diverticulitis   . GERD (gastroesophageal reflux disease)   . HTN (hypertension)   . Sleep apnea     CPAP  . Migraine   . HLD (hyperlipidemia)   . ED (erectile dysfunction)   . Low back pain   . Tubular adenoma of colon 05/2008  . Rotator cuff tear   . Anxiety   . History of herpes genitalis   . BPH (benign prostatic hypertrophy)   . Benign neoplasm of stomach   . Cancer [facility]) 2000    skin cancer    Past Surgical History  Procedure Laterality Date  . Hernia repair    . Knee surgery      mensicus  . Colonoscopy    . Polypectomy      reports that he has been smoking Cigars.  He has never used smokeless tobacco. He reports that he drinks about 0.6 oz of alcohol per week. He reports that he does not use illicit drugs. family history includes Colon cancer (age of onset: 50) in his mother. Allergies  Allergen Reactions  . Olive Tree Other  (See Comments)   Current Outpatient Prescriptions on File Prior to Visit  Medication Sig Dispense Refill  . aspirin 81 MG tablet Take 81 mg by mouth daily.      Marland Kitchen atorvastatin (LIPITOR) 40 MG tablet TAKE 1 BY MOUTH DAILY 90 tablet 3  . fluticasone (FLONASE) 50 MCG/ACT nasal spray Place 2 sprays into both nostrils daily. 48 g 3  . metoprolol succinate (TOPROL-XL) 50 MG 24 hr tablet TAKE 1 BY MOUTH DAILY WITH OR IMMEDIATELY FOLLOWING A MEAL 90 tablet 0  . tadalafil (CIALIS) 20 MG tablet Take 1 tablet (20 mg total) by mouth daily as needed for erectile dysfunction. 10 tablet 11   No current facility-administered medications on file prior to visit.   Review of Systems Constitutional: Negative for increased diaphoresis, other activity, appetite or siginficant weight change other than noted HENT: Negative for worsening hearing loss, ear pain, facial swelling, mouth sores and neck stiffness.   Eyes: Negative for other worsening pain, redness or visual disturbance.  Respiratory: Negative for shortness of breath and wheezing  Cardiovascular: Negative for chest pain and palpitations.  Gastrointestinal: Negative for diarrhea, blood in stool, abdominal distention or other pain Genitourinary: Negative for hematuria, flank pain or change in urine volume.  Musculoskeletal: Negative for myalgias or other joint complaints.  Skin: Negative for color change and wound or  drainage.  Neurological: Negative for syncope and numbness. other than noted Hematological: Negative for adenopathy. or other swelling Psychiatric/Behavioral: Negative for hallucinations, SI, self-injury, decreased concentration or other worsening agitation.      Objective:   Physical Exam BP 126/80 mmHg  Pulse 60  Temp(Src) 98.7 F (37.1 C) (Oral)  Resp 20  Ht 6\' 4"  (1.93 m)  Wt 305 lb (138.347 kg)  BMI 37.14 kg/m2  SpO2 97% VS noted,  Constitutional: Pt is oriented to person, place, and time. Appears well-developed and  well-nourished, in no significant distress Head: Normocephalic and atraumatic.  Right Ear: External ear normal.  Left Ear: External ear normal.  Nose: Nose normal.  Mouth/Throat: Oropharynx is clear and moist.  Eyes: Conjunctivae and EOM are normal. Pupils are equal, round, and reactive to light.  Neck: Normal range of motion. Neck supple. No JVD present. No tracheal deviation present or significant neck LA or mass Cardiovascular: Normal rate, regular rhythm, normal heart sounds and intact distal pulses.   Pulmonary/Chest: Effort normal and breath sounds without rales or wheezing  Abdominal: Soft. Bowel sounds are normal. NT. No HSM  Musculoskeletal: Normal range of motion. Exhibits no edema.  Lymphadenopathy:  Has no cervical adenopathy.  Neurological: Pt is alert and oriented to person, place, and time. Pt has normal reflexes. No cranial nerve deficit. Motor grossly intact Skin: Skin is warm and dry. No rash noted.  Psychiatric:  Has normal mood and affect. Behavior is normal.     Assessment & Plan:

## 2015-04-22 NOTE — Progress Notes (Signed)
Pre visit review using our clinic review tool, if applicable. No additional management support is needed unless otherwise documented below in the visit note. 

## 2015-04-22 NOTE — Assessment & Plan Note (Addendum)

## 2015-04-22 NOTE — Assessment & Plan Note (Signed)
Ok for pt to call if wants referral to gen surgury for bariatric

## 2015-05-26 ENCOUNTER — Other Ambulatory Visit: Payer: Self-pay | Admitting: Internal Medicine

## 2015-06-05 ENCOUNTER — Encounter: Payer: Self-pay | Admitting: Internal Medicine

## 2015-06-05 ENCOUNTER — Ambulatory Visit (INDEPENDENT_AMBULATORY_CARE_PROVIDER_SITE_OTHER): Payer: BLUE CROSS/BLUE SHIELD | Admitting: Internal Medicine

## 2015-06-05 VITALS — BP 136/80 | HR 65 | Resp 20 | Wt 303.0 lb

## 2015-06-05 DIAGNOSIS — R079 Chest pain, unspecified: Secondary | ICD-10-CM | POA: Diagnosis not present

## 2015-06-05 DIAGNOSIS — R002 Palpitations: Secondary | ICD-10-CM

## 2015-06-05 DIAGNOSIS — I10 Essential (primary) hypertension: Secondary | ICD-10-CM

## 2015-06-05 NOTE — Assessment & Plan Note (Signed)
stable overall by history and exam, recent data reviewed with pt, and pt to continue medical treatment as before,  to f/u any worsening symptoms or concerns BP Readings from Last 3 Encounters:  06/05/15 136/80  04/22/15 126/80  03/27/15 114/76

## 2015-06-05 NOTE — Progress Notes (Signed)
Pre visit review using our clinic review tool, if applicable. No additional management support is needed unless otherwise documented below in the visit note. 

## 2015-06-05 NOTE — Patient Instructions (Signed)
Your EKG was OK today  There does not appear to be any need for new medications  Please continue all other medications as before, and refills have been done if requested.  Please have the pharmacy call with any other refills you may need.  Please keep your appointments with your specialists as you may have planned  Please call if you would like a CXR or stress testing, but I am not sure you would need these at this time

## 2015-06-05 NOTE — Assessment & Plan Note (Addendum)
ECG reviewed as per emr, atypical, very low suspicion for cardiac, quite likley msk by exam and hx, pt declines cxr or stress test, for cont;d monitor,  to f/u any worsening symptoms or concerns

## 2015-06-05 NOTE — Progress Notes (Signed)
Subjective:    Patient ID: Antonio Reyes, male    DOB: October 17, 1945, 70 y.o.   MRN: [id number]  HPI  Here with c/o 2-3 days left CP, sharp, with a localized palpable tender sore area just left of the sternal border at approx t2 level, non pleuritic, nonexertional, non positional, without radiation and not assoc with sob, n/v, diaphoresis, palp, dizziness or syncope.  Has had the exact same pain intermittently for years.  Thought several times to himself it may have occurred after lifting activities.  Pt denies new neurological symptoms such as new headache, or facial or extremity weakness or numbness   Pt denies polydipsia, polyuria, No fever,ST, cough.  Works out with Physiological scientist weekly with HR to 100 for up to 5 min at a time without pain for several wks Past Medical History  Diagnosis Date  . Diverticulitis   . GERD (gastroesophageal reflux disease)   . HTN (hypertension)   . Sleep apnea     CPAP  . Migraine   . HLD (hyperlipidemia)   . ED (erectile dysfunction)   . Low back pain   . Tubular adenoma of colon 05/2008  . Rotator cuff tear   . Anxiety   . History of herpes genitalis   . BPH (benign prostatic hypertrophy)   . Benign neoplasm of stomach   . Cancer [facility]) 2000    skin cancer    Past Surgical History  Procedure Laterality Date  . Hernia repair    . Knee surgery      mensicus  . Colonoscopy    . Polypectomy      reports that he has been smoking Cigars.  He has never used smokeless tobacco. He reports that he drinks about 0.6 oz of alcohol per week. He reports that he does not use illicit drugs. family history includes Colon cancer (age of onset: 3) in his mother. Allergies  Allergen Reactions  . Olive Tree Other (See Comments)   Current Outpatient Prescriptions on File Prior to Visit  Medication Sig Dispense Refill  . aspirin 81 MG tablet Take 81 mg by mouth daily.      Marland Kitchen atorvastatin (LIPITOR) 40 MG tablet Take 1 tablet (40 mg total) by mouth daily. 90  tablet 3  . fluticasone (FLONASE) 50 MCG/ACT nasal spray Place 2 sprays into both nostrils daily. 48 g 3  . metoprolol succinate (TOPROL-XL) 50 MG 24 hr tablet TAKE 1 BY MOUTH DAILY WITH OR IMMEDIATELY FOLLOWING A MEAL 90 tablet 0  . tadalafil (CIALIS) 20 MG tablet Take 1 tablet (20 mg total) by mouth daily as needed for erectile dysfunction. 10 tablet 11   No current facility-administered medications on file prior to visit.   Review of Systems  Constitutional: Negative for unusual diaphoresis or night sweats HENT: Negative for ringing in ear or discharge Eyes: Negative for double vision or worsening visual disturbance.  Respiratory: Negative for choking and stridor.   Gastrointestinal: Negative for vomiting or other signifcant bowel change Genitourinary: Negative for hematuria or change in urine volume.  Musculoskeletal: Negative for other MSK pain or swelling Skin: Negative for color change and worsening wound.  Neurological: Negative for tremors and numbness other than noted  Psychiatric/Behavioral: Negative for decreased concentration or agitation other than above       Objective:   Physical Exam BP 136/80 mmHg  Pulse 65  Resp 20  Wt 303 lb (137.44 kg)  SpO2 95% VS noted,  Constitutional: Pt appears in no significant  distress HENT: Head: NCAT.  Right Ear: External ear normal.  Left Ear: External ear normal.  Eyes: . Pupils are equal, round, and reactive to light. Conjunctivae and EOM are normal Neck: Normal range of motion. Neck supple.  Cardiovascular: Normal rate and regular rhythm.   Pulmonary/Chest: Effort normal and breath sounds without rales or wheezing.  Abd:  Soft, NT, ND, + BS Has tender fingertip sized area to left parasternal t2 without swelling, skin change or rash Neurological: Pt is alert. Not confused , motor grossly intact Skin: Skin is warm. No rash, no LE edema Psychiatric: Pt behavior is normal. No agitation.   ECG reviewed as per emr     Assessment  & Plan:

## 2015-06-12 ENCOUNTER — Encounter: Payer: Self-pay | Admitting: Internal Medicine

## 2015-06-18 NOTE — Addendum Note (Signed)
Addended by: [name] on: 06/18/2015 09:59 AM   Modules accepted: Orders

## 2015-07-02 ENCOUNTER — Other Ambulatory Visit: Payer: Self-pay | Admitting: Internal Medicine

## 2016-02-25 ENCOUNTER — Ambulatory Visit (INDEPENDENT_AMBULATORY_CARE_PROVIDER_SITE_OTHER): Payer: BLUE CROSS/BLUE SHIELD | Admitting: Family Medicine

## 2016-02-25 ENCOUNTER — Encounter: Payer: Self-pay | Admitting: Family Medicine

## 2016-02-25 VITALS — BP 156/86 | HR 67 | Temp 98.6°F | Resp 20 | Wt 308.5 lb

## 2016-02-25 DIAGNOSIS — J0100 Acute maxillary sinusitis, unspecified: Secondary | ICD-10-CM | POA: Diagnosis not present

## 2016-02-25 MED ORDER — METHYLPREDNISOLONE ACETATE 80 MG/ML IJ SUSP
80.0000 mg | Freq: Once | INTRAMUSCULAR | Status: AC
  Administered 2016-02-25: 80 mg via INTRAMUSCULAR

## 2016-02-25 MED ORDER — AMOXICILLIN-POT CLAVULANATE 875-125 MG PO TABS: 1.0000 | ORAL_TABLET | Freq: Two times a day (BID) | ORAL | 0 refills | Status: DC

## 2016-02-25 NOTE — Patient Instructions (Signed)
Start flonase, continue nettiepot.  Start mucinex or Mucinex DM Augmentin prescribed, every 12 hours for 10 days.     Sinusitis, Adult Sinusitis is soreness and inflammation of your sinuses. Sinuses are hollow spaces in the bones around your face. They are located:  Around your eyes.  In the middle of your forehead.  Behind your nose.  In your cheekbones. Your sinuses and nasal passages are lined with a stringy fluid (mucus). Mucus normally drains out of your sinuses. When your nasal tissues get inflamed or swollen, the mucus can get trapped or blocked so air cannot flow through your sinuses. This lets bacteria, viruses, and funguses grow, and that leads to infection. Follow these instructions at home: Medicines  Take, use, or apply over-the-counter and prescription medicines only as told by your doctor. These may include nasal sprays.  If you were prescribed an antibiotic medicine, take it as told by your doctor. Do not stop taking the antibiotic even if you start to feel better. Hydrate and Humidify  Drink enough water to keep your pee (urine) clear or pale yellow.  Use a cool mist humidifier to keep the humidity level in your home above 50%.  Breathe in steam for 10-15 minutes, 3-4 times a day or as told by your doctor. You can do this in the bathroom while a hot shower is running.  Try not to spend time in cool or dry air. Rest  Rest as much as possible.  Sleep with your head raised (elevated).  Make sure to get enough sleep each night. General instructions  Put a warm, moist washcloth on your face 3-4 times a day or as told by your doctor. This will help with discomfort.  Wash your hands often with soap and water. If there is no soap and water, use hand sanitizer.  Do not smoke. Avoid being around people who are smoking (secondhand smoke).  Keep all follow-up visits as told by your doctor. This is important. Contact a doctor if:  You have a fever.  Your  symptoms get worse.  Your symptoms do not get better within 10 days. Get help right away if:  You have a very bad headache.  You cannot stop throwing up (vomiting).  You have pain or swelling around your face or eyes.  You have trouble seeing.  You feel confused.  Your neck is stiff.  You have trouble breathing. This information is not intended to replace advice given to you by your health care provider. Make sure you discuss any questions you have with your health care provider. Document Released: 08/25/2007 Document Revised: 11/02/2015 Document Reviewed: 01/01/2015 Elsevier Interactive Patient Education  2017 [employer].

## 2016-02-25 NOTE — Progress Notes (Signed)
Antonio Reyes , July 01, 1945, 70 y.o., male MRN: [id number] Patient Care Team    Relationship Specialty Notifications Start End  [name], MD PCP - General   03/04/10     CC: headache Subjective: Pt presents for an acute OV with complaints of URI symptoms of 2-3 weeks duration.  Associated symptoms include headache, congestion,  (productive) and fatigued. He denies nausea, vomit, fever, chills or diarrhea. He reports chronic issues surrounding his sinuses. He has tried Sudafed, nyquil, afrin and Migraine medicine.Pt feels symptoms are not resolving or improving. He states he frequently becomes ill after a flight, and this occurred after traveling over thanksgiving. He endorses CPAP use and only an occasional cigar.   Allergies  Allergen Reactions  . Olive Tree Other (See Comments)   Social History  Substance Use Topics  . Smoking status: Current Some Day Smoker    Types: Cigars  . Smokeless tobacco: Never Used     Comment: occasionally  . Alcohol use 0.6 oz/week    1 Standard drinks or equivalent per week     Comment: social 2 beers a month   Past Medical History:  Diagnosis Date  . Anxiety   . Benign neoplasm of stomach   . BPH (benign prostatic hypertrophy)   . Cancer ([place]) 2000   skin cancer   . Diverticulitis   . ED (erectile dysfunction)   . GERD (gastroesophageal reflux disease)   . History of herpes genitalis   . HLD (hyperlipidemia)   . HTN (hypertension)   . Low back pain   . Migraine   . Rotator cuff tear   . Sleep apnea    CPAP  . Tubular adenoma of colon 05/2008   Past Surgical History:  Procedure Laterality Date  . COLONOSCOPY    . HERNIA REPAIR    . KNEE SURGERY     mensicus  . POLYPECTOMY     Family History  Problem Relation Age of Onset  . Colon cancer Mother 7     Medication List       Accurate as of 02/25/16  2:54 PM. Always use your most recent med list.          aspirin 81 MG tablet Take 81 mg by mouth daily.     atorvastatin 40 MG tablet Commonly known as:  LIPITOR Take 1 tablet (40 mg total) by mouth daily.   fluticasone 50 MCG/ACT nasal spray Commonly known as:  FLONASE USE 2 SPRAYS IN EACH NOSTRIL DAILY   metoprolol succinate 50 MG 24 hr tablet Commonly known as:  TOPROL-XL TAKE 1 BY MOUTH DAILY WITH OR IMMEDIATELY FOLLOWING A MEAL   montelukast 10 MG tablet Commonly known as:  SINGULAIR TAKE 1 BY MOUTH DAILY   tadalafil 20 MG tablet Commonly known as:  CIALIS Take 1 tablet (20 mg total) by mouth daily as needed for erectile dysfunction.       No results found for this or any previous visit (from the past 24 hour(s)). No results found.   ROS: Negative, with the exception of above mentioned in HPI   Objective:  BP (!) 156/86 (BP Location: Left Arm, Patient Position: Sitting, Cuff Size: Large)   Pulse 67   Temp 98.6 F (37 C)   Resp 20   Wt (!) 308 lb 8 oz (139.9 kg)   SpO2 97%   BMI 37.55 kg/m  Body mass index is 37.55 kg/m. Gen: Afebrile. No acute distress. Nontoxic in appearance, well developed,  well nourished.  HENT: AT. [place]. Bilateral TM visualized wnl. MMM, no oral lesions. Bilateral nares with erythema and swelling. Throat without erythema or exudates. Hoarseness, congestion and TTP sinus cavity present.  Eyes:Pupils Equal Round Reactive to light, Extraocular movements intact,  Conjunctiva without redness, discharge or icterus. Neck/lymp/endocrine: Supple,no lymphadenopathy CV: RRR  Chest: CTAB, no wheeze or crackles. Good air movement, normal resp effort.  Abd: Soft. NTND. BS present. no Masses palpated. Neuro: Normal gait. PERLA. EOMi. Alert. Oriented x3  Assessment/Plan: Antonio Reyes is a 70 y.o. male present for acute OV for  Acute maxillary sinusitis, recurrence not specified - flonase, nettie pot, mucinex (DM) use. DC afrin (discussed not recommended after 3 days) - Augmetnin, IM depo med 80 - F/U 1 week if not improving.   electronically signed  by:  Howard Pouch, DO  [place]

## 2016-04-05 ENCOUNTER — Other Ambulatory Visit (INDEPENDENT_AMBULATORY_CARE_PROVIDER_SITE_OTHER): Payer: BLUE CROSS/BLUE SHIELD

## 2016-04-05 DIAGNOSIS — Z0000 Encounter for general adult medical examination without abnormal findings: Secondary | ICD-10-CM | POA: Diagnosis not present

## 2016-04-05 LAB — CBC WITH DIFFERENTIAL/PLATELET
BASOS ABS: 0.1 10*3/uL (ref 0.0–0.1)
Basophils Relative: 0.9 % (ref 0.0–3.0)
Eosinophils Absolute: 0.1 10*3/uL (ref 0.0–0.7)
Eosinophils Relative: 1.7 % (ref 0.0–5.0)
HEMATOCRIT: 49 % (ref 39.0–52.0)
Hemoglobin: 17 g/dL (ref 13.0–17.0)
LYMPHS ABS: 2.4 10*3/uL (ref 0.7–4.0)
LYMPHS PCT: 29.2 % (ref 12.0–46.0)
MCHC: 34.6 g/dL (ref 30.0–36.0)
MCV: 87.7 fl (ref 78.0–100.0)
MONOS PCT: 6.9 % (ref 3.0–12.0)
Monocytes Absolute: 0.6 10*3/uL (ref 0.1–1.0)
NEUTROS ABS: 5 10*3/uL (ref 1.4–7.7)
NEUTROS PCT: 61.3 % (ref 43.0–77.0)
PLATELETS: 278 10*3/uL (ref 150.0–400.0)
RBC: 5.59 Mil/uL (ref 4.22–5.81)
RDW: 13.9 % (ref 11.5–15.5)
WBC: 8.2 10*3/uL (ref 4.0–10.5)

## 2016-04-05 LAB — HEPATIC FUNCTION PANEL
ALK PHOS: 81 U/L (ref 39–117)
ALT: 37 U/L (ref 0–53)
AST: 23 U/L (ref 0–37)
Albumin: 4.4 g/dL (ref 3.5–5.2)
BILIRUBIN DIRECT: 0.2 mg/dL (ref 0.0–0.3)
Total Bilirubin: 1.1 mg/dL (ref 0.2–1.2)
Total Protein: 7 g/dL (ref 6.0–8.3)

## 2016-04-05 LAB — BASIC METABOLIC PANEL
BUN: 21 mg/dL (ref 6–23)
CALCIUM: 9.8 mg/dL (ref 8.4–10.5)
CO2: 30 meq/L (ref 19–32)
CREATININE: 1.43 mg/dL (ref 0.40–1.50)
Chloride: 104 mEq/L (ref 96–112)
GFR: 52.18 mL/min — AB (ref >60.00)
Glucose, Bld: 117 mg/dL — ABNORMAL HIGH (ref 70–99)
Potassium: 4.7 mEq/L (ref 3.5–5.1)
Sodium: 140 mEq/L (ref 135–145)

## 2016-04-05 LAB — PSA: PSA: 0.9 ng/mL (ref 0.10–4.00)

## 2016-04-05 LAB — URINALYSIS, ROUTINE W REFLEX MICROSCOPIC
BILIRUBIN URINE: NEGATIVE
Hgb urine dipstick: NEGATIVE
KETONES UR: NEGATIVE
Leukocytes, UA: NEGATIVE
Nitrite: NEGATIVE
RBC / HPF: NONE SEEN (ref 0–?)
SPECIFIC GRAVITY, URINE: 1.02 (ref 1.000–1.030)
Total Protein, Urine: NEGATIVE
UROBILINOGEN UA: 0.2 (ref 0.0–1.0)
Urine Glucose: NEGATIVE
WBC UA: NONE SEEN (ref 0–?)
pH: 6 (ref 5.0–8.0)

## 2016-04-05 LAB — LIPID PANEL
CHOL/HDL RATIO: 3
Cholesterol: 125 mg/dL (ref 0–200)
HDL: 36.9 mg/dL — AB (ref >39.00)
LDL Cholesterol: 63 mg/dL (ref 0–99)
NONHDL: 88
Triglycerides: 126 mg/dL (ref 0.0–149.0)
VLDL: 25.2 mg/dL (ref 0.0–40.0)

## 2016-04-05 LAB — HEPATITIS C ANTIBODY: HCV AB: NEGATIVE

## 2016-04-05 LAB — TSH: TSH: 1.98 u[IU]/mL (ref 0.35–4.50)

## 2016-04-21 ENCOUNTER — Encounter: Payer: Self-pay | Admitting: Internal Medicine

## 2016-04-21 ENCOUNTER — Ambulatory Visit (INDEPENDENT_AMBULATORY_CARE_PROVIDER_SITE_OTHER): Payer: BLUE CROSS/BLUE SHIELD | Admitting: Internal Medicine

## 2016-04-21 VITALS — BP 138/78 | HR 67 | Temp 98.5°F | Resp 20 | Wt 300.0 lb

## 2016-04-21 DIAGNOSIS — Z0000 Encounter for general adult medical examination without abnormal findings: Secondary | ICD-10-CM

## 2016-04-21 DIAGNOSIS — R739 Hyperglycemia, unspecified: Secondary | ICD-10-CM | POA: Diagnosis not present

## 2016-04-21 DIAGNOSIS — Z23 Encounter for immunization: Secondary | ICD-10-CM | POA: Diagnosis not present

## 2016-04-21 NOTE — Patient Instructions (Addendum)

## 2016-04-21 NOTE — Assessment & Plan Note (Signed)

## 2016-04-21 NOTE — Assessment & Plan Note (Signed)
Mild for increase activity, wt los, f/u a1c with next labs

## 2016-04-21 NOTE — Progress Notes (Signed)
Subjective:    Patient ID: Antonio Reyes, male    DOB: 1945/07/24, 71 y.o.   MRN: [id number]  HPI  Here for wellness and f/u;  Overall doing ok;  Pt denies Chest pain, worsening SOB, DOE, wheezing, orthopnea, PND, worsening LE edema, palpitations, dizziness or syncope.  Pt denies neurological change such as new headache, facial or extremity weakness.  Pt denies polydipsia, polyuria, or low sugar symptoms. Pt states overall good compliance with treatment and medications, good tolerability, and has been trying to follow appropriate diet.  Pt denies worsening depressive symptoms, suicidal ideation or panic. No fever, night sweats, wt loss, loss of appetite, or other constitutional symptoms.  Pt states good ability with ADL's, has low fall risk, home safety reviewed and adequate, no other significant changes in hearing or vision, and only occasionally active with exercise.  Still working, Youth worker.  No other new hx Past Medical History:  Diagnosis Date  . Anxiety   . Benign neoplasm of stomach   . BPH (benign prostatic hypertrophy)   . Cancer ([place]) 2000   skin cancer   . Diverticulitis   . ED (erectile dysfunction)   . GERD (gastroesophageal reflux disease)   . History of herpes genitalis   . HLD (hyperlipidemia)   . HTN (hypertension)   . Low back pain   . Migraine   . Rotator cuff tear   . Sleep apnea    CPAP  . Tubular adenoma of colon 05/2008   Past Surgical History:  Procedure Laterality Date  . COLONOSCOPY    . HERNIA REPAIR    . KNEE SURGERY     mensicus  . POLYPECTOMY      reports that he has been smoking Cigars.  He has never used smokeless tobacco. He reports that he drinks about 0.6 oz of alcohol per week . He reports that he does not use drugs. family history includes Colon cancer (age of onset: 57) in his mother. Allergies  Allergen Reactions  . Olive Tree Other (See Comments)   Current Outpatient Prescriptions on File Prior to Visit  Medication Sig  Dispense Refill  . aspirin 81 MG tablet Take 81 mg by mouth daily.      Marland Kitchen atorvastatin (LIPITOR) 40 MG tablet Take 1 tablet (40 mg total) by mouth daily. 90 tablet 3  . fluticasone (FLONASE) 50 MCG/ACT nasal spray USE 2 SPRAYS IN EACH NOSTRIL DAILY 48 g 1  . metoprolol succinate (TOPROL-XL) 50 MG 24 hr tablet TAKE 1 BY MOUTH DAILY WITH OR IMMEDIATELY FOLLOWING A MEAL 90 tablet 3  . montelukast (SINGULAIR) 10 MG tablet TAKE 1 BY MOUTH DAILY 90 tablet 3  . tadalafil (CIALIS) 20 MG tablet Take 1 tablet (20 mg total) by mouth daily as needed for erectile dysfunction. 10 tablet 11   No current facility-administered medications on file prior to visit.    Review of Systems Constitutional: Negative for increased diaphoresis, or other activity, appetite or siginficant weight change other than noted HENT: Negative for worsening hearing loss, ear pain, facial swelling, mouth sores and neck stiffness.   Eyes: Negative for other worsening pain, redness or visual disturbance.  Respiratory: Negative for choking or stridor Cardiovascular: Negative for other chest pain and palpitations.  Gastrointestinal: Negative for worsening diarrhea, blood in stool, or abdominal distention Genitourinary: Negative for hematuria, flank pain or change in urine volume.  Musculoskeletal: Negative for myalgias or other joint complaints.  Skin: Negative for other color change and wound or drainage.  Neurological: Negative for syncope and numbness. other than noted Hematological: Negative for adenopathy. or other swelling Psychiatric/Behavioral: Negative for hallucinations, SI, self-injury, decreased concentration or other worsening agitation.  All other system neg per pt    Objective:   Physical Exam BP 138/78   Pulse 67   Temp 98.5 F (36.9 C) (Oral)   Resp 20   Wt 300 lb (136.1 kg)   SpO2 95%   BMI 36.52 kg/m  VS noted, obese Constitutional: Pt is oriented to person, place, and time. Appears well-developed and  well-nourished, in no significant distress Head: Normocephalic and atraumatic  Eyes: Conjunctivae and EOM are normal. Pupils are equal, round, and reactive to light Right Ear: External ear normal.  Left Ear: External ear normal Nose: Nose normal.  Mouth/Throat: Oropharynx is clear and moist  Neck: Normal range of motion. Neck supple. No JVD present. No tracheal deviation present or significant neck LA or mass Cardiovascular: Normal rate, regular rhythm, normal heart sounds and intact distal pulses.   Pulmonary/Chest: Effort normal and breath sounds without rales or wheezing  Abdominal: Soft. Bowel sounds are normal. NT. No HSM  Musculoskeletal: Normal range of motion. Exhibits no edema Lymphadenopathy: Has no cervical adenopathy.  Neurological: Pt is alert and oriented to person, place, and time. Pt has normal reflexes. No cranial nerve deficit. Motor grossly intact Skin: Skin is warm and dry. No rash noted or new ulcers Psychiatric:  Has normal mood and affect. Behavior is normal.  No other new exam findings    Assessment & Plan:

## 2016-04-21 NOTE — Progress Notes (Signed)
Pre visit review using our clinic review tool, if applicable. No additional management support is needed unless otherwise documented below in the visit note. 

## 2016-05-26 ENCOUNTER — Other Ambulatory Visit: Payer: Self-pay | Admitting: *Deleted

## 2016-05-26 MED ORDER — FLUTICASONE PROPIONATE 50 MCG/ACT NA SUSP: 2.0000 | Freq: Every day | NASAL | 1 refills | Status: DC

## 2016-05-26 MED ORDER — ATORVASTATIN CALCIUM 40 MG PO TABS: 40.0000 mg | ORAL_TABLET | Freq: Every day | ORAL | 3 refills | Status: DC

## 2016-05-26 MED ORDER — METOPROLOL SUCCINATE ER 50 MG PO TB24: ORAL_TABLET | ORAL | 3 refills | Status: DC

## 2016-05-26 MED ORDER — MONTELUKAST SODIUM 10 MG PO TABS: ORAL_TABLET | ORAL | 3 refills | Status: DC

## 2016-06-01 ENCOUNTER — Ambulatory Visit: Payer: Medicare Other | Admitting: Family Medicine

## 2016-06-01 ENCOUNTER — Encounter: Payer: Self-pay | Admitting: Physician Assistant

## 2016-06-01 ENCOUNTER — Ambulatory Visit (INDEPENDENT_AMBULATORY_CARE_PROVIDER_SITE_OTHER): Payer: BLUE CROSS/BLUE SHIELD

## 2016-06-01 ENCOUNTER — Ambulatory Visit (INDEPENDENT_AMBULATORY_CARE_PROVIDER_SITE_OTHER): Payer: BLUE CROSS/BLUE SHIELD | Admitting: Physician Assistant

## 2016-06-01 VITALS — BP 124/80 | HR 75 | Temp 98.2°F | Ht 76.0 in | Wt 295.0 lb

## 2016-06-01 DIAGNOSIS — M79672 Pain in left foot: Secondary | ICD-10-CM

## 2016-06-01 NOTE — Patient Instructions (Addendum)
Please make an appointment with Dr. Paulla Fore in 1 week. If for some reason you want the post-op shoe, let us know and it is $24. Try to keep your joint immobilized where the pain is.  Let us know if your pain does not improve.  Turf Toe Turf toe is an injury that affects the joint at the base of the big toe. It occurs when the toe is bent upward by force and extended beyond its normal limits (hyperextension). The joint of the big toe is surrounded by tissues (ligaments and tendons) that help to keep it in place. If any of these tissues are damaged, turf toe may result. Turf toe is a common sports injury. It can be mild if the tissue was stretched. It may be more severe if the tissue was partially or completely torn. Early treatment usually results in good recovery. In some cases, a person may continue to have some pain, joint stiffness, and reduced ability to push off from the affected foot. What are the causes? This injury is caused by extreme upward bending of the big toe joint. It can occur when:  Your toe is pressed flat to the ground and your heel is raised while you push off forcefully with the front of your foot. For example, this could happen when you begin a sprint.  You push off on the ball of your foot repeatedly while running or jumping, especially on hard surfaces such as a basketball court.  You jam your toe from a force pushing into the toe. What increases the risk? This injury is more likely to occur in people:  Who wear flexible shoes that do not offer good support while running or jumping.  Who participate in activities or sports that involve running and jumping on turf or hard surfaces, such as:  Soccer.  Football.  Basketball.  Volleyball.  Gymnastics.  Dancing.  Wrestling. What are the signs or symptoms? Symptoms of this condition include:  Pain at the base of the toe.  Swelling at the base of the toe.  Stiffness.  Limited movement of the  toe.  Bruising. If turf toe is the result of a direct injury, symptoms may appear suddenly. If the condition is due to repetitive movements, such as running and jumping, the symptoms may develop gradually and worsen over time. How is this diagnosed? This condition may be diagnosed based on your symptoms, your medical history, and a physical exam of your foot. During the exam, the health care provider will check the range of motion of your toe by moving it up and down and from side to side. You may also have tests to confirm the diagnosis, such as:  X-rays to rule out bone problems, such as a fracture, a chip, or bones that are out of alignment.  MRI to view the soft tissue and cartilage in your toe. This will help to determine how severe your injury is. How is this treated? Treatment for this condition depends on the severity of the injury. Treatment may include:  Rest, ice, compression, and elevation. This is often called the RICE strategy. It may be recommended if the injury is mild. Your health care provider may also restrict movement of your toe by taping it to the smaller toes.  Wearing a walking boot or a cast. For more serious turf toe, you may need to wear a walking boot for about one week. This will keep your toe from moving (immobilization). If you have severe turf toe, you  may need to wear a cast or a walking boot for several weeks.  Over-the-counter medicines to relieve pain.  Physical therapy. Stretching and strengthening exercises can help to reduce or prevent joint stiffness in your toe.  Surgery. In rare cases, surgery may be needed for a severe injury if pain does not go away. Follow these instructions at home: Managing pain, stiffness, and swelling   If directed, apply ice to the injured area:  Put ice in a plastic bag.  Place a towel between your skin and the bag.  Leave the ice on for 20 minutes, 2-3 times per day.  Move your toes often to avoid stiffness and to  lessen swelling.  Wear an elastic compression bandage to help prevent or lessen swelling.  Raise (elevate) your foot above the level of your heart while you are sitting or lying down.  If you have a walking boot, wear it as told by your health care provider.  Do not use the injured foot to support (bear) your body weight until your health care provider says that you can. Use crutches as told by your health care provider. Medicines   Take over-the-counter and prescription medicines only as told by your health care provider.  Do not drive or operate heavy machinery while taking prescription pain medicine. If you have a cast:   Do not put pressure on any part of the cast until it is fully hardened. This may take several hours.  Do not stick anything inside the cast to scratch your skin. Doing that increases your risk of infection.  Check the skin around the cast every day. Report any concerns to your health care provider. You may put lotion on dry skin around the edges of the cast. Do not apply lotion to the skin underneath the cast.  Do not let your cast get wet if it is not waterproof.  Keep the cast clean. General instructions   If your cast is not waterproof, cover it with a watertight plastic bag when you take a bath or a shower.  Return to your normal activities as told by your health care provider. Ask your health care provider what activities are safe for you.  Switch to less-flexible, more supportive footwear as told by your health care provider. Rigid shoe inserts (orthotics) can also reduce stress on your toes and improve stability.  Keep all follow-up visits as told by your health care provider. This is important. Contact a health care provider if:  You have new bruising or swelling in your toe.  The pain in your toe gets worse.  Your pain medicine is not helping. Get help right away if:  Your cast or walking boot becomes loose or damaged.  Your pain becomes  severe.  Your toe becomes numb or changes color.  Your toe joint feels unstable or is unable to bear any weight. This information is not intended to replace advice given to you by your health care provider. Make sure you discuss any questions you have with your health care provider. Document Released: 08/28/2001 Document Revised: 11/09/2015 Document Reviewed: 09/18/2014 Elsevier Interactive Patient Education  2017 [employer].

## 2016-06-01 NOTE — Progress Notes (Signed)
Subjective:    Patient ID: Antonio Reyes, male    DOB: 1945-05-20, 71 y.o.   MRN: [id number]  HPI  Antonio Reyes is a 71 y/o male who presents today with L foot pain. He reports that last Monday, one week ago, he tripped over the dishwasher and hyperextended his left great toe. Since that time his foot has been bruised and has had a little bit of swelling. Pain is only when he applies pressure to the ball of his foot when walking, or if he ever been his foot.Marland Kitchen He took ibuprofen one time. He is elevating his foot on an ottoman when he is at home. He denies any history of gout or arthritis. He has not injured this foot before.  Review of Systems  See HPI  Past Medical History:  Diagnosis Date  . Anxiety   . Benign neoplasm of stomach   . BPH (benign prostatic hypertrophy)   . Cancer ([place]) 2000   skin cancer   . Diverticulitis   . ED (erectile dysfunction)   . GERD (gastroesophageal reflux disease)   . History of herpes genitalis   . HLD (hyperlipidemia)   . HTN (hypertension)   . Low back pain   . Migraine   . Rotator cuff tear   . Sleep apnea    CPAP  . Tubular adenoma of colon 05/2008     Social History   Social History  . Marital status: Married    Spouse name: N/A  . Number of children: 1  . Years of education: N/A   Occupational History  . sales and marketing Radiographer, therapeutic)    Social History Main Topics  . Smoking status: Current Some Day Smoker    Types: Cigars  . Smokeless tobacco: Never Used     Comment: occasionally  . Alcohol use 0.6 oz/week    1 Standard drinks or equivalent per week     Comment: social 2 beers a month  . Drug use: No  . Sexual activity: Not on file   Other Topics Concern  . Not on file   Social History Narrative  . No narrative on file    Past Surgical History:  Procedure Laterality Date  . COLONOSCOPY    . HERNIA REPAIR    . KNEE SURGERY     mensicus  . POLYPECTOMY      Family History  Problem Relation Age of Onset    . Colon cancer Mother 34    Allergies  Allergen Reactions  . Olive Tree Other (See Comments)    Current Outpatient Prescriptions on File Prior to Visit  Medication Sig Dispense Refill  . aspirin 81 MG tablet Take 81 mg by mouth daily.      Marland Kitchen atorvastatin (LIPITOR) 40 MG tablet Take 1 tablet (40 mg total) by mouth daily. 90 tablet 3  . fluticasone (FLONASE) 50 MCG/ACT nasal spray Place 2 sprays into both nostrils daily. 48 g 1  . metoprolol succinate (TOPROL-XL) 50 MG 24 hr tablet TAKE 1 BY MOUTH DAILY WITH OR IMMEDIATELY FOLLOWING A MEAL 90 tablet 3  . montelukast (SINGULAIR) 10 MG tablet TAKE 1 BY MOUTH DAILY 90 tablet 3  . tadalafil (CIALIS) 20 MG tablet Take 1 tablet (20 mg total) by mouth daily as needed for erectile dysfunction. 10 tablet 11   No current facility-administered medications on file prior to visit.     BP 124/80 (BP Location: Left Arm, Patient Position: Sitting, Cuff Size: Large)  Pulse 75   Temp 98.2 F (36.8 C) (Oral)   Ht 6\' 4"  (1.93 m)   Wt 295 lb (133.8 kg)   SpO2 94%   BMI 35.91 kg/m      Objective:   Physical Exam  Constitutional: He appears well-developed and well-nourished. He is cooperative.  Non-toxic appearance. He does not have a sickly appearance. He does not appear ill. No distress.  Cardiovascular:  Intact distal pulses; adequate capillary refill in all of L toes.  Musculoskeletal:       Feet:  Ecchymosis and erythema to L first metatarsal and PIP joint of L great toe   Neurological: He is alert.  Skin: Skin is warm, dry and intact.   L foot xray: no acute fracture, reviewed with Dr. Teresa Coombs     Assessment & Plan:  1. Left foot pain Reviewed case with Dr. Paulla Fore. Patient with hyperextension injury. I do not see any fractures on xray. Patient was educated on the recommendation for a postop shoe however he declined, stated that his shoes that he was wearing do not bend and he was able to keep his foot without bending while  walking. I did tell patient that if he were to change his mind, the post-op shoe is available at this clinic. I would like for him to follow-up with Dr. Paulla Fore in one week. He can follow-up sooner if any acute concerns arise. - DG Foot Complete Left; Future  Inda Coke PA-C 06/01/16

## 2016-06-01 NOTE — Progress Notes (Signed)
Pre visit review using our clinic review tool, if applicable. No additional management support is needed unless otherwise documented below in the visit note. 

## 2016-06-08 ENCOUNTER — Ambulatory Visit (INDEPENDENT_AMBULATORY_CARE_PROVIDER_SITE_OTHER): Payer: BLUE CROSS/BLUE SHIELD | Admitting: Sports Medicine

## 2016-06-08 ENCOUNTER — Encounter: Payer: Self-pay | Admitting: Sports Medicine

## 2016-06-08 VITALS — BP 124/78 | HR 62 | Ht 76.0 in | Wt 293.4 lb

## 2016-06-08 DIAGNOSIS — S93529A Sprain of metatarsophalangeal joint of unspecified toe(s), initial encounter: Secondary | ICD-10-CM

## 2016-06-08 DIAGNOSIS — S8002XA Contusion of left knee, initial encounter: Secondary | ICD-10-CM | POA: Diagnosis not present

## 2016-06-08 NOTE — Progress Notes (Signed)
Antonio Reyes - 71 y.o. male MRN [id number]  Date of birth: [birth year]  Office Visit Note: Visit Date: 06/08/2016 PCP: Cathlean Cower, MD Referred by: [name], MD  Subjective: Chief Complaint  Patient presents with  . turf toe    left   HPI: Significantly better. Not wearing post op shoe.  Reports mild left knee pain.  Occasional medial sided knee pain with mechanical symptoms.  Overall however significantly improved.  Does report striking the anterior medial aspect of his left knee when he fell and believes this is how he injured the toe as well.  Is only having minimal symptoms this time.  He does have a wedding coming up in several months  ROS:   Otherwise per HPI.  Objective:  VS:  HT:6\' 4"  (193 cm)   WT:293 lb 6.4 oz (133.1 kg)  BMI:35.8    BP:124/78  HR:62bpm  TEMP: ( )  RESP:96 % Physical Exam: GENERAL:  WDWN, NAD, Non-toxic appearing PSYCH:  Alert & appropriately interactive  Not depressed or anxious appearing LOWER EXTREMITIES:  No significant rashes/lesions/ulcerations overlying the legs.  Trace pitting edema bilaterally.  No clubbing or cyanosis.  DP PT pulses 1+/4  Sensation intact to light touch. LEFT TOE: Overall only minimal bruising and swelling at this time.  He has focal tenderness to palpation along the anterior medial aspect of the great toe.  Pain with flexion as well as with lateral deviation LEFT KNEE: Overall well aligned.  No significant deformity.  No significant effusion.  He is a small amount of discomfort with palpation of the anteromedial aspect of his knee is minimal.  Pain is more focally over the pes bursa and it is anywhere.  No mechanical symptoms that are reproducible.   Imaging & Procedures: Dg Foot Complete Left  Result Date: 06/01/2016 CLINICAL DATA:  [place] 1 week ago, painful and bruising at base of great toe EXAM: LEFT FOOT - COMPLETE 3+ VIEW COMPARISON:  None FINDINGS: Osseous mineralization normal for technique. Joint  spaces preserved. No definite acute fracture, dislocation or bone destruction. Tiny calcific density is seen at the medial margin of the first metatarsal head, potentially old though a tiny acute avulsion fragment is not completely excluded. Calcific tendinitis at the distal Achilles tendon. Question mild soft tissue swelling at the forefoot overlying the MTP joints. IMPRESSION: Questionable age-indeterminate fracture fragment at the medial margin of the first metatarsal head. Calcific tendinitis at the distal Achilles tendon. Electronically Signed   By: Lavonia Dana M.D.   On: 06/01/2016 14:46     Assessment & Plan: Problem List Items Addressed This Visit    Contusion of left knee    He does have small contusion of the knee over the anterior medial aspect.  He is only mildly sore this is directly over the pes bursa.  There is no significant effusion.  No skin weakness or evidence of ligament instability.  Conservative management discussed today.      Turf toe - Primary    Patient does have a small avulsion of the superior medial aspect of the capsule.  This consistent with fifth toe.  He is a good improvement.  He does not wish for any type of other immobilization although we did discuss using a carbon fiber insert versus postoperative shoe.  Recommend avoiding any type of flexion or extension of this time.         Follow-up: Return if symptoms worsen or fail to improve.   Past Medical/Family/Surgical/Social  History: Medications & Allergies reviewed per EMR Patient Active Problem List   Diagnosis Date Noted  . Turf toe 06/11/2016  . Contusion of left knee 06/11/2016  . Hyperglycemia 04/21/2016  . Chest pain 06/05/2015  . Polycythemia, secondary 02/09/2013  . Dizziness 06/08/2011  . Chronic sinusitis 06/08/2011  . Preventative health care 08/18/2010  . Ankle pain 08/18/2010  . Impaired glucose tolerance 08/18/2010  . Morbid obesity ([place]) 04/20/2010  . ABRASION, [place] 04/20/2010  .  CHEST PAIN 07/22/2009  . GENITAL HERPES 04/12/2008  . ANXIETY 04/12/2008  . MIGRAINE, COMMON 04/12/2008  . BENIGN PROSTATIC HYPERTROPHY 04/12/2008  . COLONIC POLYPS, HX OF 04/12/2008  . LOW BACK PAIN 11/26/2006  . HYPERCHOLESTEROLEMIA 11/17/2006  . ERECTILE DYSFUNCTION 11/17/2006  . Essential hypertension 11/17/2006  . GERD 11/17/2006  . OSA (obstructive sleep apnea) 11/17/2006  . DIVERTICULITIS, HX OF 11/17/2006   Past Medical History:  Diagnosis Date  . Anxiety   . Benign neoplasm of stomach   . BPH (benign prostatic hypertrophy)   . Cancer ([place]) 2000   skin cancer   . Diverticulitis   . ED (erectile dysfunction)   . GERD (gastroesophageal reflux disease)   . History of herpes genitalis   . HLD (hyperlipidemia)   . HTN (hypertension)   . Low back pain   . Migraine   . Rotator cuff tear   . Sleep apnea    CPAP  . Tubular adenoma of colon 05/2008   Family History  Problem Relation Age of Onset  . Colon cancer Mother 38   Past Surgical History:  Procedure Laterality Date  . COLONOSCOPY    . HERNIA REPAIR    . KNEE SURGERY     mensicus  . POLYPECTOMY     Social History   Occupational History  . sales and marketing Radiographer, therapeutic)    Social History Main Topics  . Smoking status: Current Some Day Smoker    Types: Cigars  . Smokeless tobacco: Never Used     Comment: occasionally  . Alcohol use 0.6 oz/week    1 Standard drinks or equivalent per week     Comment: social 2 beers a month  . Drug use: No  . Sexual activity: Not on file

## 2016-06-08 NOTE — Patient Instructions (Signed)
Turf Toe Turf toe is an injury that affects the joint at the base of the big toe. It occurs when the toe is bent upward by force and extended beyond its normal limits (hyperextension). The joint of the big toe is surrounded by tissues (ligaments and tendons) that help to keep it in place. If any of these tissues are damaged, turf toe may result. Turf toe is a common sports injury. It can be mild if the tissue was stretched. It may be more severe if the tissue was partially or completely torn. Early treatment usually results in good recovery. In some cases, a person may continue to have some pain, joint stiffness, and reduced ability to push off from the affected foot. What are the causes? This injury is caused by extreme upward bending of the big toe joint. It can occur when:  Your toe is pressed flat to the ground and your heel is raised while you push off forcefully with the front of your foot. For example, this could happen when you begin a sprint.  You push off on the ball of your foot repeatedly while running or jumping, especially on hard surfaces such as a basketball court.  You jam your toe from a force pushing into the toe. What increases the risk? This injury is more likely to occur in people:  Who wear flexible shoes that do not offer good support while running or jumping.  Who participate in activities or sports that involve running and jumping on turf or hard surfaces, such as:  Soccer.  Football.  Basketball.  Volleyball.  Gymnastics.  Dancing.  Wrestling. What are the signs or symptoms? Symptoms of this condition include:  Pain at the base of the toe.  Swelling at the base of the toe.  Stiffness.  Limited movement of the toe.  Bruising. If turf toe is the result of a direct injury, symptoms may appear suddenly. If the condition is due to repetitive movements, such as running and jumping, the symptoms may develop gradually and worsen over time. How is this  diagnosed? This condition may be diagnosed based on your symptoms, your medical history, and a physical exam of your foot. During the exam, the health care provider will check the range of motion of your toe by moving it up and down and from side to side. You may also have tests to confirm the diagnosis, such as:  X-rays to rule out bone problems, such as a fracture, a chip, or bones that are out of alignment.  MRI to view the soft tissue and cartilage in your toe. This will help to determine how severe your injury is. How is this treated? Treatment for this condition depends on the severity of the injury. Treatment may include:  Rest, ice, compression, and elevation. This is often called the RICE strategy. It may be recommended if the injury is mild. Your health care provider may also restrict movement of your toe by taping it to the smaller toes.  Wearing a walking boot or a cast. For more serious turf toe, you may need to wear a walking boot for about one week. This will keep your toe from moving (immobilization). If you have severe turf toe, you may need to wear a cast or a walking boot for several weeks.  Over-the-counter medicines to relieve pain.  Physical therapy. Stretching and strengthening exercises can help to reduce or prevent joint stiffness in your toe.  Surgery. In rare cases, surgery may be needed for  a severe injury if pain does not go away. Follow these instructions at home: Managing pain, stiffness, and swelling   If directed, apply ice to the injured area:  Put ice in a plastic bag.  Place a towel between your skin and the bag.  Leave the ice on for 20 minutes, 2-3 times per day.  Move your toes often to avoid stiffness and to lessen swelling.  Wear an elastic compression bandage to help prevent or lessen swelling.  Raise (elevate) your foot above the level of your heart while you are sitting or lying down.  If you have a walking boot, wear it as told by your  health care provider.  Do not use the injured foot to support (bear) your body weight until your health care provider says that you can. Use crutches as told by your health care provider. Medicines   Take over-the-counter and prescription medicines only as told by your health care provider.  Do not drive or operate heavy machinery while taking prescription pain medicine. If you have a cast:   Do not put pressure on any part of the cast until it is fully hardened. This may take several hours.  Do not stick anything inside the cast to scratch your skin. Doing that increases your risk of infection.  Check the skin around the cast every day. Report any concerns to your health care provider. You may put lotion on dry skin around the edges of the cast. Do not apply lotion to the skin underneath the cast.  Do not let your cast get wet if it is not waterproof.  Keep the cast clean. General instructions   If your cast is not waterproof, cover it with a watertight plastic bag when you take a bath or a shower.  Return to your normal activities as told by your health care provider. Ask your health care provider what activities are safe for you.  Switch to less-flexible, more supportive footwear as told by your health care provider. Rigid shoe inserts (orthotics) can also reduce stress on your toes and improve stability.  Keep all follow-up visits as told by your health care provider. This is important. Contact a health care provider if:  You have new bruising or swelling in your toe.  The pain in your toe gets worse.  Your pain medicine is not helping. Get help right away if:  Your cast or walking boot becomes loose or damaged.  Your pain becomes severe.  Your toe becomes numb or changes color.  Your toe joint feels unstable or is unable to bear any weight. This information is not intended to replace advice given to you by your health care provider. Make sure you discuss any questions  you have with your health care provider. Document Released: 08/28/2001 Document Revised: 11/09/2015 Document Reviewed: 09/18/2014 Elsevier Interactive Patient Education  2017 [employer].

## 2016-06-11 DIAGNOSIS — M2020 Hallux rigidus, unspecified foot: Secondary | ICD-10-CM | POA: Insufficient documentation

## 2016-06-11 DIAGNOSIS — S8002XA Contusion of left knee, initial encounter: Secondary | ICD-10-CM | POA: Insufficient documentation

## 2016-06-11 NOTE — Assessment & Plan Note (Signed)
Patient does have a small avulsion of the superior medial aspect of the capsule.  This consistent with fifth toe.  He is a good improvement.  He does not wish for any type of other immobilization although we did discuss using a carbon fiber insert versus postoperative shoe.  Recommend avoiding any type of flexion or extension of this time.

## 2016-06-11 NOTE — Assessment & Plan Note (Signed)
He does have small contusion of the knee over the anterior medial aspect.  He is only mildly sore this is directly over the pes bursa.  There is no significant effusion.  No skin weakness or evidence of ligament instability.  Conservative management discussed today.

## 2016-11-10 ENCOUNTER — Encounter: Payer: Self-pay | Admitting: Sports Medicine

## 2016-11-10 ENCOUNTER — Ambulatory Visit (INDEPENDENT_AMBULATORY_CARE_PROVIDER_SITE_OTHER): Payer: BLUE CROSS/BLUE SHIELD | Admitting: Sports Medicine

## 2016-11-10 VITALS — BP 130/82 | HR 76 | Ht 76.0 in | Wt 301.6 lb

## 2016-11-10 DIAGNOSIS — M7741 Metatarsalgia, right foot: Secondary | ICD-10-CM | POA: Diagnosis not present

## 2016-11-10 DIAGNOSIS — S93529D Sprain of metatarsophalangeal joint of unspecified toe(s), subsequent encounter: Secondary | ICD-10-CM

## 2016-11-10 DIAGNOSIS — M7742 Metatarsalgia, left foot: Secondary | ICD-10-CM | POA: Diagnosis not present

## 2016-11-10 NOTE — Progress Notes (Signed)
OFFICE VISIT NOTE Antonio Reyes, [facility]  Antonio Reyes - 71 y.o. male MRN [id number]  Date of birth: 1946-01-28  Visit Date: 11/10/2016  PCP: [name], MD   Referred by: [name], MD  Burlene Arnt, CMA acting as scribe for Dr. Paulla Fore.  SUBJECTIVE:   Chief Complaint  Patient presents with  . Follow-up    toe/heel pain   HPI: As below and per problem based documentation when appropriate.  Antonio Reyes is an established patient presenting today in follow-up of toe/heel pain/numbness. He was last seen 06/08/2016.   Pt c/o stiffness "turf toe" of the left foot. He has numbness on the side and the balls of his foot. These same sx started in his rt foot earlier this summer. He has pain/pressure on the ball of his feet. He reports that his toe on lt foot still feet fairly stiff. He sits at a computer while at work and notices the sensation in both feet when he stands. He tends to be more comfortable when he doesn't have shoes on.     Review of Systems  Constitutional: Negative for chills and fever.  Respiratory: Negative for shortness of breath and wheezing.   Cardiovascular: Negative for chest pain, palpitations and leg swelling.  Musculoskeletal: Positive for joint pain. Negative for falls.  Neurological: Positive for tingling (tingling in fingers when arm is elevated). Negative for dizziness and headaches.  Endo/Heme/Allergies: Does not bruise/bleed easily.    Otherwise per HPI.  HISTORY & PERTINENT PRIOR DATA:  No specialty comments available. He reports that he has been smoking Cigars.  He has never used smokeless tobacco. No results for input(s): HGBA1C, LABURIC in the last 8760 hours. Medications & Allergies reviewed per EMR Patient Active Problem List   Diagnosis Date Noted  . Metatarsalgia 11/11/2016  . Hallux rigidus 06/11/2016  . Contusion of left knee 06/11/2016  . Hyperglycemia  04/21/2016  . Chest pain 06/05/2015  . Polycythemia, secondary 02/09/2013  . Dizziness 06/08/2011  . Chronic sinusitis 06/08/2011  . Preventative health care 08/18/2010  . Ankle pain 08/18/2010  . Impaired glucose tolerance 08/18/2010  . Morbid obesity ([place]) 04/20/2010  . ABRASION, [place] 04/20/2010  . CHEST PAIN 07/22/2009  . GENITAL HERPES 04/12/2008  . ANXIETY 04/12/2008  . MIGRAINE, COMMON 04/12/2008  . BENIGN PROSTATIC HYPERTROPHY 04/12/2008  . COLONIC POLYPS, HX OF 04/12/2008  . LOW BACK PAIN 11/26/2006  . HYPERCHOLESTEROLEMIA 11/17/2006  . ERECTILE DYSFUNCTION 11/17/2006  . Essential hypertension 11/17/2006  . GERD 11/17/2006  . OSA (obstructive sleep apnea) 11/17/2006  . DIVERTICULITIS, HX OF 11/17/2006   Past Medical History:  Diagnosis Date  . Anxiety   . Benign neoplasm of stomach   . BPH (benign prostatic hypertrophy)   . Cancer ([place]) 2000   skin cancer   . Diverticulitis   . ED (erectile dysfunction)   . GERD (gastroesophageal reflux disease)   . History of herpes genitalis   . HLD (hyperlipidemia)   . HTN (hypertension)   . Low back pain   . Migraine   . Rotator cuff tear   . Sleep apnea    CPAP  . Tubular adenoma of colon 05/2008   Family History  Problem Relation Age of Onset  . Colon cancer Mother 57   Past Surgical History:  Procedure Laterality Date  . COLONOSCOPY    . HERNIA REPAIR    . KNEE SURGERY  mensicus  . POLYPECTOMY     Social History   Occupational History  . sales and marketing Radiographer, therapeutic)    Social History Main Topics  . Smoking status: Current Some Day Smoker    Types: Cigars  . Smokeless tobacco: Never Used     Comment: occasionally  . Alcohol use 0.6 oz/week    1 Standard drinks or equivalent per week     Comment: social 2 beers a month  . Drug use: No  . Sexual activity: Not on file    OBJECTIVE:  VS:  HT:6\' 4"  (193 cm)   WT:(!) 301 lb 9.6 oz (136.8 kg)  BMI:36.73    BP:130/82  HR:76bpm  TEMP: ( )   RESP:94 % EXAM: Findings:  Adult male.  No acute distress.  Alert and appropriate.  Left foot is overall well aligned with a high cavus rigid arch.  He has splay toe between the fourth and fifth toes on the left.  DP and PT pulses are palpable.  He has good dorsiflexion bilaterally.  Ankle testing is ligamentously stable. Does have pain across the plantar aspect of the midfoot and specifically over the second and third metatarsal heads.  The left great toe has only 30-40 of flexion extension consistent with hallux limitus.  No pain with varus and valgus strain of the great toe.     No results found. ASSESSMENT & PLAN:     ICD-10-CM   1. Turf toe, subsequent encounter S93.529D   2. Metatarsalgia of both feet M77.41    M77.42   ================================================================= Metatarsalgia Ultimately patient has lost some range of motion of his left great toe but is having increased pressure across the transverse arch that is causing breakdown due to his underlying high cavus rigid foot. He is getting pain across the MTPs but no focal bony tenderness.  He had marked improvement in his symptoms and overall felt comfortable with the addition of longitudinal metatarsal pads and information where he could obtain more of these physical issues was provided.  Can consider custom cushion orthotics going forward he will call if he is interested in these.  Otherwise we will plan to follow-up with him as needed.  Hallux rigidus He has fairly significant stiffness of the great toe on the left responded well from a pain standpoint to the longitudinal metatarsal pads.  If persistent ongoing pain consider addition of a first ray post. ================================================================= There are no Patient Instructions on file for this visit.================================================================= No future appointments.  Follow-up: Return if symptoms worsen or fail  to improve.   CMA/ATC served as Education administrator during this visit. History, Physical, and Plan performed by medical provider. Documentation and orders reviewed and attested to.      Teresa Coombs, [facility] Physician

## 2016-11-11 DIAGNOSIS — M7740 Metatarsalgia, unspecified foot: Secondary | ICD-10-CM | POA: Insufficient documentation

## 2016-11-11 NOTE — Assessment & Plan Note (Signed)
Ultimately patient has lost some range of motion of his left great toe but is having increased pressure across the transverse arch that is causing breakdown due to his underlying high cavus rigid foot. He is getting pain across the MTPs but no focal bony tenderness.  He had marked improvement in his symptoms and overall felt comfortable with the addition of longitudinal metatarsal pads and information where he could obtain more of these physical issues was provided.  Can consider custom cushion orthotics going forward he will call if he is interested in these.  Otherwise we will plan to follow-up with him as needed.

## 2016-11-11 NOTE — Assessment & Plan Note (Signed)
He has fairly significant stiffness of the great toe on the left responded well from a pain standpoint to the longitudinal metatarsal pads.  If persistent ongoing pain consider addition of a first ray post.

## 2017-04-14 ENCOUNTER — Other Ambulatory Visit (INDEPENDENT_AMBULATORY_CARE_PROVIDER_SITE_OTHER): Payer: BLUE CROSS/BLUE SHIELD

## 2017-04-14 DIAGNOSIS — R739 Hyperglycemia, unspecified: Secondary | ICD-10-CM

## 2017-04-14 DIAGNOSIS — Z0000 Encounter for general adult medical examination without abnormal findings: Secondary | ICD-10-CM | POA: Diagnosis not present

## 2017-04-14 LAB — URINALYSIS, ROUTINE W REFLEX MICROSCOPIC
Bilirubin Urine: NEGATIVE
Hgb urine dipstick: NEGATIVE
Ketones, ur: NEGATIVE
Leukocytes, UA: NEGATIVE
Nitrite: NEGATIVE
RBC / HPF: NONE SEEN (ref 0–?)
Specific Gravity, Urine: 1.02 (ref 1.000–1.030)
Total Protein, Urine: NEGATIVE
Urine Glucose: NEGATIVE
Urobilinogen, UA: 0.2 (ref 0.0–1.0)
WBC, UA: NONE SEEN (ref 0–?)
pH: 6 (ref 5.0–8.0)

## 2017-04-14 LAB — CBC WITH DIFFERENTIAL/PLATELET
BASOS ABS: 0.1 10*3/uL (ref 0.0–0.1)
Basophils Relative: 1.1 % (ref 0.0–3.0)
EOS PCT: 2.1 % (ref 0.0–5.0)
Eosinophils Absolute: 0.2 10*3/uL (ref 0.0–0.7)
HEMATOCRIT: 51.5 % (ref 39.0–52.0)
HEMOGLOBIN: 17.3 g/dL — AB (ref 13.0–17.0)
LYMPHS ABS: 2.6 10*3/uL (ref 0.7–4.0)
LYMPHS PCT: 32.2 % (ref 12.0–46.0)
MCHC: 33.6 g/dL (ref 30.0–36.0)
MCV: 91.3 fl (ref 78.0–100.0)
MONOS PCT: 7.7 % (ref 3.0–12.0)
Monocytes Absolute: 0.6 10*3/uL (ref 0.1–1.0)
Neutro Abs: 4.7 10*3/uL (ref 1.4–7.7)
Neutrophils Relative %: 56.9 % (ref 43.0–77.0)
Platelets: 252 10*3/uL (ref 150.0–400.0)
RBC: 5.64 Mil/uL (ref 4.22–5.81)
RDW: 13.3 % (ref 11.5–15.5)
WBC: 8.2 10*3/uL (ref 4.0–10.5)

## 2017-04-14 LAB — HEPATIC FUNCTION PANEL
ALT: 32 U/L (ref 0–53)
AST: 21 U/L (ref 0–37)
Albumin: 4.4 g/dL (ref 3.5–5.2)
Alkaline Phosphatase: 85 U/L (ref 39–117)
Bilirubin, Direct: 0.2 mg/dL (ref 0.0–0.3)
Total Bilirubin: 0.9 mg/dL (ref 0.2–1.2)
Total Protein: 6.9 g/dL (ref 6.0–8.3)

## 2017-04-14 LAB — LIPID PANEL
Cholesterol: 110 mg/dL (ref 0–200)
HDL: 34.1 mg/dL — ABNORMAL LOW (ref >39.00)
LDL Cholesterol: 54 mg/dL (ref 0–99)
NonHDL: 75.43
Total CHOL/HDL Ratio: 3
Triglycerides: 106 mg/dL (ref 0.0–149.0)
VLDL: 21.2 mg/dL (ref 0.0–40.0)

## 2017-04-14 LAB — BASIC METABOLIC PANEL
BUN: 19 mg/dL (ref 6–23)
CALCIUM: 9.5 mg/dL (ref 8.4–10.5)
CO2: 29 mEq/L (ref 19–32)
CREATININE: 1.31 mg/dL (ref 0.40–1.50)
Chloride: 102 mEq/L (ref 96–112)
GFR: 57.56 mL/min — AB (ref >60.00)
Glucose, Bld: 133 mg/dL — ABNORMAL HIGH (ref 70–99)
Potassium: 5 mEq/L (ref 3.5–5.1)
SODIUM: 140 meq/L (ref 135–145)

## 2017-04-14 LAB — PSA: PSA: 1.07 ng/mL (ref 0.10–4.00)

## 2017-04-14 LAB — TSH: TSH: 2.59 u[IU]/mL (ref 0.35–4.50)

## 2017-04-14 LAB — HEMOGLOBIN A1C: Hgb A1c MFr Bld: 6 % (ref 4.6–6.5)

## 2017-04-22 ENCOUNTER — Encounter: Payer: Self-pay | Admitting: Internal Medicine

## 2017-04-22 ENCOUNTER — Ambulatory Visit: Payer: BLUE CROSS/BLUE SHIELD | Admitting: Internal Medicine

## 2017-04-22 VITALS — BP 114/76 | HR 60 | Temp 98.2°F | Ht 76.0 in | Wt 304.0 lb

## 2017-04-22 DIAGNOSIS — M79605 Pain in left leg: Secondary | ICD-10-CM

## 2017-04-22 DIAGNOSIS — R1031 Right lower quadrant pain: Secondary | ICD-10-CM

## 2017-04-22 DIAGNOSIS — G4733 Obstructive sleep apnea (adult) (pediatric): Secondary | ICD-10-CM | POA: Diagnosis not present

## 2017-04-22 DIAGNOSIS — I10 Essential (primary) hypertension: Secondary | ICD-10-CM

## 2017-04-22 DIAGNOSIS — M79671 Pain in right foot: Secondary | ICD-10-CM | POA: Diagnosis not present

## 2017-04-22 DIAGNOSIS — Z0001 Encounter for general adult medical examination with abnormal findings: Secondary | ICD-10-CM

## 2017-04-22 DIAGNOSIS — R1032 Left lower quadrant pain: Secondary | ICD-10-CM

## 2017-04-22 DIAGNOSIS — Z0000 Encounter for general adult medical examination without abnormal findings: Secondary | ICD-10-CM

## 2017-04-22 DIAGNOSIS — M79672 Pain in left foot: Secondary | ICD-10-CM | POA: Diagnosis not present

## 2017-04-22 DIAGNOSIS — R7302 Impaired glucose tolerance (oral): Secondary | ICD-10-CM

## 2017-04-22 DIAGNOSIS — R1030 Lower abdominal pain, unspecified: Secondary | ICD-10-CM | POA: Diagnosis not present

## 2017-04-22 MED ORDER — ZOSTER VAC RECOMB ADJUVANTED 50 MCG/0.5ML IM SUSR: 0.5000 mL | Freq: Once | INTRAMUSCULAR | 1 refills | Status: AC

## 2017-04-22 MED ORDER — BUPROPION HCL ER (XL) 150 MG PO TB24: 150.0000 mg | ORAL_TABLET | Freq: Every day | ORAL | 3 refills | Status: DC

## 2017-04-22 MED ORDER — NALTREXONE HCL 50 MG PO TABS: 25.0000 mg | ORAL_TABLET | Freq: Every day | ORAL | 3 refills | Status: DC

## 2017-04-22 NOTE — Patient Instructions (Addendum)
Your shingles shot was sent to the pharmacy  Please take all new medication as prescribed - the weight loss medication  You will be contacted regarding the referral for: pulmonary for sleep apnea follow up  Please continue all other medications as before, and refills have been done if requested.  Please have the pharmacy call with any other refills you may need.  Please continue your efforts at being more active, low cholesterol diet, and weight control.  You are otherwise up to date with prevention measures today.  Please keep your appointments with your specialists as you may have planned  Please return in 1 year for your yearly visit, or sooner if needed, with Lab testing done 3-5 days before

## 2017-04-22 NOTE — Progress Notes (Signed)
Subjective:    Patient ID: Antonio Reyes, male    DOB: 1946/02/08, 72 y.o.   MRN: [id number]  HPI  Here for wellness and f/u;  Overall doing ok;  Pt denies Chest pain, worsening SOB, DOE, wheezing, orthopnea, PND, worsening LE edema, palpitations, dizziness or syncope.  Pt denies neurological change such as new headache, facial or extremity weakness.  Pt denies polydipsia, polyuria, or low sugar symptoms. Pt states overall good compliance with treatment and medications, good tolerability, and has been trying to follow appropriate diet.  Pt denies worsening depressive symptoms, suicidal ideation or panic. No fever, night sweats, wt loss, loss of appetite, or other constitutional symptoms.  Pt states good ability with ADL's, has low fall risk, home safety reviewed and adequate, no other significant changes in hearing or vision, and only occasionally active with exercise. Also c/o 2-3 wks worsening sharp bilat feet pain mostly at the first MTPs with forming bunions.   Also has a left lateral leg ingrown hair like lesion lar days, now improved but still has a dark area.   Also has URI symptoms last wk now improved  Also has c/o bilat groin pain sharp intermittent, maybe related to lbp with bilat radiation neuritic like, but not clear.  Worse to stand or prolonged walking for several weeks.  Has hx of lumbar disc dz and recently cut down a large tree.  Wants to lose wt.  Due for pulm f/u for OSA  Wt up 11 lbs since last yr Wt Readings from Last 3 Encounters:  04/22/17 (!) 304 lb (137.9 kg)  11/10/16 (!) 301 lb 9.6 oz (136.8 kg)  06/08/16 293 lb 6.4 oz (133.1 kg)   Past Medical History:  Diagnosis Date  . Anxiety   . Benign neoplasm of stomach   . BPH (benign prostatic hypertrophy)   . Cancer ([place]) 2000   skin cancer   . Diverticulitis   . ED (erectile dysfunction)   . GERD (gastroesophageal reflux disease)   . History of herpes genitalis   . HLD (hyperlipidemia)   . HTN (hypertension)   .  Low back pain   . Migraine   . Rotator cuff tear   . Sleep apnea    CPAP  . Tubular adenoma of colon 05/2008   Past Surgical History:  Procedure Laterality Date  . COLONOSCOPY    . HERNIA REPAIR    . KNEE SURGERY     mensicus  . POLYPECTOMY      reports that he has been smoking cigars.  he has never used smokeless tobacco. He reports that he drinks about 0.6 oz of alcohol per week. He reports that he does not use drugs. family history includes Colon cancer (age of onset: 65) in his mother. Allergies  Allergen Reactions  . Olive Tree Other (See Comments)   Current Outpatient Medications on File Prior to Visit  Medication Sig Dispense Refill  . aspirin 81 MG tablet Take 81 mg by mouth daily.      Marland Kitchen atorvastatin (LIPITOR) 40 MG tablet Take 1 tablet (40 mg total) by mouth daily. 90 tablet 3  . fluticasone (FLONASE) 50 MCG/ACT nasal spray Place 2 sprays into both nostrils daily. 48 g 1  . metoprolol succinate (TOPROL-XL) 50 MG 24 hr tablet TAKE 1 BY MOUTH DAILY WITH OR IMMEDIATELY FOLLOWING A MEAL 90 tablet 3  . montelukast (SINGULAIR) 10 MG tablet TAKE 1 BY MOUTH DAILY 90 tablet 3  . tadalafil (CIALIS) 20 MG tablet  Take 1 tablet (20 mg total) by mouth daily as needed for erectile dysfunction. 10 tablet 11   No current facility-administered medications on file prior to visit.    Review of Systems Constitutional: Negative for other unusual diaphoresis, sweats, appetite or weight changes HENT: Negative for other worsening hearing loss, ear pain, facial swelling, mouth sores or neck stiffness.   Eyes: Negative for other worsening pain, redness or other visual disturbance.  Respiratory: Negative for other stridor or swelling Cardiovascular: Negative for other palpitations or other chest pain  Gastrointestinal: Negative for worsening diarrhea or loose stools, blood in stool, distention or other pain Genitourinary: Negative for hematuria, flank pain or other change in urine volume.    Musculoskeletal: Negative for myalgias or other joint swelling.  Skin: Negative for other color change, or other wound or worsening drainage.  Neurological: Negative for other syncope or numbness. Hematological: Negative for other adenopathy or swelling Psychiatric/Behavioral: Negative for hallucinations, other worsening agitation, SI, self-injury, or new decreased concentration All other system neg per pt    Objective:   Physical Exam BP 114/76   Pulse 60   Temp 98.2 F (36.8 C) (Oral)   Ht 6\' 4"  (1.93 m)   Wt (!) 304 lb (137.9 kg)   SpO2 96%   BMI 37.00 kg/m  VS noted, morbid obese Constitutional: Pt is oriented to person, place, and time. Appears well-developed and well-nourished, in no significant distress and comfortable Head: Normocephalic and atraumatic  Eyes: Conjunctivae and EOM are normal. Pupils are equal, round, and reactive to light Right Ear: External ear normal without discharge Left Ear: External ear normal without discharge Nose: Nose without discharge or deformity Mouth/Throat: Oropharynx is without other ulcerations and moist  Neck: Normal range of motion. Neck supple. No JVD present. No tracheal deviation present or significant neck LA or mass Cardiovascular: Normal rate, regular rhythm, normal heart sounds and intact distal pulses.   Pulmonary/Chest: WOB normal and breath sounds without rales or wheezing  Abdominal: Soft. Bowel sounds are normal. NT. No HSM  Musculoskeletal: Normal range of motion. Exhibits no edema, spine nontender in midline, bilat plantar feet NT but has mild bunions mild tender with hallux rigidus Lymphadenopathy: Has no other cervical adenopathy.  Neurological: Pt is alert and oriented to person, place, and time. Pt has normal reflexes. No cranial nerve deficit. Motor grossly intact, Gait intact Skin: Skin is warm and dry. No rash noted or new ulcerations, dark area approx 1 cm left lateral leg without tender or raised Psychiatric:  Has  normal mood and affect. Behavior is normal without agitation No other exam findings    Assessment & Plan:

## 2017-04-23 ENCOUNTER — Encounter: Payer: Self-pay | Admitting: Internal Medicine

## 2017-04-23 NOTE — Assessment & Plan Note (Signed)
For contrave like med regimen with 2 generics,  to f/u any worsening symptoms or concerns

## 2017-04-23 NOTE — Assessment & Plan Note (Signed)
stable overall by history and exam, recent data reviewed with pt, and pt to continue medical treatment as before,  to f/u any worsening symptoms or concerns BP Readings from Last 3 Encounters:  04/22/17 114/76  11/10/16 130/82  06/08/16 124/78

## 2017-04-23 NOTE — Assessment & Plan Note (Signed)
Likely has small skin abscess that resolved itself last wk, as well as URI resolved,  to f/u any worsening symptoms or concerns, no further tx needed

## 2017-04-23 NOTE — Assessment & Plan Note (Addendum)
Most liekly related to bunions, pt declines podiatry for now  In addition to the time spent performing CPE, I spent an additional 25 minutes face to face,in which greater than 50% of this time was spent in counseling and coordination of care for patient's acute illness as documented, including the differential dx, treatment, further evaluation and other management of bilat foot pain, left leg pain, bilat groin pain, OSA, morbid obesity, hyperglycemia, and HTN

## 2017-04-23 NOTE — Assessment & Plan Note (Signed)

## 2017-04-23 NOTE — Assessment & Plan Note (Signed)
Exam benign, cannot r/o referred lumbar pain , pt declines films today or ortho evaluation

## 2017-04-23 NOTE — Assessment & Plan Note (Signed)
Lab Results  Component Value Date   HGBA1C 6.0 04/14/2017   stable overall by history and exam, recent data reviewed with pt, and pt to continue medical treatment as before,  to f/u any worsening symptoms or concerns 

## 2017-04-23 NOTE — Assessment & Plan Note (Signed)
For pulm referral, due for f/u

## 2017-06-02 ENCOUNTER — Other Ambulatory Visit: Payer: Self-pay | Admitting: Internal Medicine

## 2017-06-03 ENCOUNTER — Institutional Professional Consult (permissible substitution): Payer: BLUE CROSS/BLUE SHIELD | Admitting: Pulmonary Disease

## 2017-06-10 ENCOUNTER — Encounter: Payer: Self-pay | Admitting: Pulmonary Disease

## 2017-06-10 ENCOUNTER — Ambulatory Visit: Payer: BLUE CROSS/BLUE SHIELD | Admitting: Pulmonary Disease

## 2017-06-10 ENCOUNTER — Other Ambulatory Visit: Payer: Self-pay | Admitting: Internal Medicine

## 2017-06-10 DIAGNOSIS — D751 Secondary polycythemia: Secondary | ICD-10-CM

## 2017-06-10 DIAGNOSIS — G4733 Obstructive sleep apnea (adult) (pediatric): Secondary | ICD-10-CM | POA: Diagnosis not present

## 2017-06-10 NOTE — Patient Instructions (Signed)
we will review report on your CPAP and advised changes of the pressure if needed.  Alternatives to mouth breathing include -Use of chinstrap to condition yourself -Use of full facemask such as the air fit F 30

## 2017-06-10 NOTE — Assessment & Plan Note (Signed)
we will review report on your CPAP and advised changes of the pressure if needed.  Alternatives to mouth breathing include -Use of chinstrap to condition yourself -Use of full facemask such as the air fit F 30  Weight loss encouraged, compliance with goal of at least 4-6 hrs every night is the expectation. Advised against medications with sedative side effects Cautioned against driving when sleepy - understanding that sleepiness will vary on a day to day basis

## 2017-06-10 NOTE — Progress Notes (Signed)
Subjective:    Patient ID: Antonio Reyes, male    DOB: [birth year], 72 y.o.   MRN: [id number]  HPI  72 yo man presents to reestablish care for obstructive sleep apnea.Marland Kitchen He is maintained on CPAP nasal pillows [place] then again in 2016.  He has been maintained on auto CPAP 8 - 15 cm with nasal pillows with good improvement in his daytime somnolence and fatigue. Wife has noted significant improvement. Over the last 3 years he has developed increasing nocturia.  His weight has increased from 292 pounds in 2016-304 pounds. He reports increasing dryness of mouth. Epworth sleepiness score is 4. Bedtime is between 10 and 10:30 PM, sleep latency about 30 minutes, sleeps on his back and then also on his left side with 2 pillows, reports 2-4 nocturnal awakenings mainly due to nocturia.  Sometimes when he takes his mask off he does not put it back again, wakes up in the morning around 7:30 AM with dryness of mouth feeling rested.   He is only use nasal pillows and does not like any other mask interface.  He obtained a new machine about 2 years ago  Used to work for [employer] and now works as a Optometrist, travels a lot.  His wife works for the [employer] but very often and seems to clot very easily, he was told that his iron level is high    Significant tests/ events reviewed  PSG 2010>> titrated to 15 cm but had events during REM. He does not like any other mask interface except pillows.   Past Medical History:  Diagnosis Date  . Anxiety   . Benign neoplasm of stomach   . BPH (benign prostatic hypertrophy)   . Cancer ([place]) 2000   skin cancer   . Diverticulitis   . ED (erectile dysfunction)   . GERD (gastroesophageal reflux disease)   . History of herpes genitalis   . HLD (hyperlipidemia)   . HTN (hypertension)   . Low back pain   . Migraine   . Rotator cuff tear   . Sleep apnea    CPAP  . Tubular  adenoma of colon 05/2008     Past Surgical History:  Procedure Laterality Date  . COLONOSCOPY    . HERNIA REPAIR    . KNEE SURGERY     mensicus  . POLYPECTOMY      Allergies  Allergen Reactions  . Olive Tree Other (See Comments)     Social History   Socioeconomic History  . Marital status: Married    Spouse name: Not on file  . Number of children: 1  . Years of education: Not on file  . Highest education level: Not on file  Occupational History  . Occupation: Magazine features editor)  Social Needs  . Financial resource strain: Not on file  . Food insecurity:    Worry: Not on file    Inability: Not on file  . Transportation needs:    Medical: Not on file    Non-medical: Not on file  Tobacco Use  . Smoking status: Current Some Day Smoker    Types: Cigars  . Smokeless tobacco: Never Used  . Tobacco comment: occasionally  Substance and Sexual Activity  . Alcohol use: Yes    Alcohol/week: 0.6 oz    Types: 1 Standard drinks or equivalent per week    Comment: social 2 beers  a month  . Drug use: No  . Sexual activity: Not on file  Lifestyle  . Physical activity:    Days per week: Not on file    Minutes per session: Not on file  . Stress: Not on file  Relationships  . Social connections:    Talks on phone: Not on file    Gets together: Not on file    Attends religious service: Not on file    Active member of club or organization: Not on file    Attends meetings of clubs or organizations: Not on file    Relationship status: Not on file  . Intimate partner violence:    Fear of current or ex partner: Not on file    Emotionally abused: Not on file    Physically abused: Not on file    Forced sexual activity: Not on file  Other Topics Concern  . Not on file  Social History Narrative  . Not on file     Family History  Problem Relation Age of Onset  . Colon cancer Mother 59       Review of Systems Positive for acid heartburn and nasal  congestion   Constitutional: negative for anorexia, fevers and sweats  Eyes: negative for irritation, redness and visual disturbance  Ears, nose, mouth, throat, and face: negative for earaches, epistaxis,  and sore throat  Respiratory: negative for cough, dyspnea on exertion, sputum and wheezing  Cardiovascular: negative for chest pain, dyspnea, lower extremity edema, orthopnea, palpitations and syncope  Gastrointestinal: negative for abdominal pain, constipation, diarrhea, melena, nausea and vomiting  Genitourinary:negative for dysuria, frequency and hematuria  Hematologic/lymphatic: negative for bleeding, easy bruising and lymphadenopathy  Musculoskeletal:negative for arthralgias, muscle weakness and stiff joints  Neurological: negative for coordination problems, gait problems, headaches and weakness  Endocrine: negative for diabetic symptoms including polydipsia, polyuria and weight loss     Objective:   Physical Exam  Gen. Pleasant, obese, in no distress ENT - no lesions, no post nasal drip Neck: No JVD, no thyromegaly, no carotid bruits Lungs: no use of accessory muscles, no dullness to percussion, decreased without rales or rhonchi  Cardiovascular: Rhythm regular, heart sounds  normal, no murmurs or gallops, no peripheral edema Musculoskeletal: No deformities, no cyanosis or clubbing , no tremors        Assessment & Plan:

## 2017-06-10 NOTE — Assessment & Plan Note (Signed)
Hemoglobin upper side of normal dating back to 2000

## 2017-06-11 ENCOUNTER — Other Ambulatory Visit: Payer: Self-pay | Admitting: Internal Medicine

## 2017-06-14 ENCOUNTER — Telehealth: Payer: Self-pay | Admitting: Pulmonary Disease

## 2017-06-14 NOTE — Telephone Encounter (Signed)
Received patient's CPAP machine today and was able to get a download. Will place the download in RA's look-at for when he comes back into the office.

## 2017-06-20 NOTE — Telephone Encounter (Signed)
Download was reviewed, on auto CPAP settings, compliance is acceptable. He would qualify for a new machine if he is interested. Auto CPAP 8-15 cm

## 2017-06-21 NOTE — Telephone Encounter (Signed)
lmtcb x1 for pt. 

## 2017-06-22 NOTE — Telephone Encounter (Signed)
Spoke with pt. He is aware of CPAP download results. States that he does not want a new machine at this time. Nothing further was needed.

## 2017-06-23 ENCOUNTER — Other Ambulatory Visit: Payer: Self-pay | Admitting: Internal Medicine

## 2017-06-24 ENCOUNTER — Other Ambulatory Visit: Payer: Self-pay | Admitting: Internal Medicine

## 2017-07-11 NOTE — Progress Notes (Signed)
[facility] [address] [place], [place] [address] Phone: [phone] Subjective:     I'm seeing this patient by the request  of:   [name], MD   CC: back pain   [id number]  Antonio Reyes is a 72 y.o. male coming in with complaint of back pain. Patient hurt his back 2 weeks ago when hitting balls in a batting cage. Pain is on right side. Denies any radiating symptoms. 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 Pain is constant and is stabbing pain. Patient has tried sleeping on a harder mattress but is still having pain. He has been using alleve. Patient states that there is no radiation down the legs or any numbness or tingling.  Patient denies any weakness.  Patient states though that after sitting for a long amount of time unfortunately that seems to be when the discomfort and pain starts to come back on a more regular basis.      Past Medical History:  Diagnosis Date  . Anxiety   . Benign neoplasm of stomach   . BPH (benign prostatic hypertrophy)   . Cancer ([place]) 2000   skin cancer   . Diverticulitis   . ED (erectile dysfunction)   . GERD (gastroesophageal reflux disease)   . History of herpes genitalis   . HLD (hyperlipidemia)   . HTN (hypertension)   . Low back pain   . Migraine   . Rotator cuff tear   . Sleep apnea    CPAP  . Tubular adenoma of colon 05/2008   Past Surgical History:  Procedure Laterality Date  . COLONOSCOPY    . HERNIA REPAIR    . KNEE SURGERY     mensicus  . POLYPECTOMY     Social History   Socioeconomic History  . Marital status: Married    Spouse name: Not on file  . Number of children: 1  . Years of education: Not on file  . Highest education level: Not on file  Occupational History  . Occupation: Magazine features editor)  Social Needs  . Financial resource strain: Not on file  . Food insecurity:    Worry: Not on file    Inability: Not on file  .  Transportation needs:    Medical: Not on file    Non-medical: Not on file  Tobacco Use  . Smoking status: Current Some Day Smoker    Types: Cigars  . Smokeless tobacco: Never Used  . Tobacco comment: occasionally  Substance and Sexual Activity  . Alcohol use: Yes    Alcohol/week: 0.6 oz    Types: 1 Standard drinks or equivalent per week    Comment: social 2 beers a month  . Drug use: No  . Sexual activity: Not on file  Lifestyle  . Physical activity:    Days per week: Not on file    Minutes per session: Not on file  . Stress: Not on file  Relationships  . Social connections:    Talks on phone: Not on file    Gets together: Not on file    Attends religious service: Not on file    Active member of club or organization: Not on file    Attends meetings of clubs or organizations: Not on file    Relationship status: Not on file  Other Topics Concern  . Not on file  Social History Narrative  . Not on file  Allergies  Allergen Reactions  . Olive Tree Other (See Comments)   Family History  Problem Relation Age of Onset  . Colon cancer Mother 23     Past medical history, social, surgical and family history all reviewed in electronic medical record.  No pertanent information unless stated regarding to the chief complaint.   Review of Systems:Review of systems updated and as accurate as of 07/12/17  No headache, visual changes, nausea, vomiting, diarrhea, constipation, dizziness, abdominal pain, skin rash, fevers, chills, night sweats, weight loss, swollen lymph nodes, body aches, joint swelling,  chest pain, shortness of breath, mood changes.  Positive muscle aches  Objective  Blood pressure 140/82, pulse (!) 56, height 6\' 4"  (1.93 m), weight (!) 305 lb (138.3 kg), SpO2 97 %. Systems examined below as of 07/12/17   General: No apparent distress alert and oriented x3 mood and affect normal, dressed appropriately.  HEENT: Pupils equal, extraocular movements intact    Respiratory: Patient's speak in full sentences and does not appear short of breath  Cardiovascular: No lower extremity edema, non tender, no erythema  Skin: Warm dry intact with no signs of infection or rash on extremities or on axial skeleton.  Abdomen: Soft nontender  Neuro: Cranial nerves II through XII are intact, neurovascularly intact in all extremities with 2+ DTRs and 2+ pulses.  Lymph: No lymphadenopathy of posterior or anterior cervical chain or axillae bilaterally.  Gait normal with good balance and coordination.  MSK:  Non tender with full range of motion and good stability and symmetric strength and tone of shoulders, elbows, wrist, hip, knee and ankles bilaterally.  Back Exam:  Inspection: Loss of lordosis Motion: Flexion 35 deg, Extension 15 deg, Side Bending to 35 deg bilaterally,  Rotation to 30 deg bilaterally  SLR laying: Negative  XSLR laying: Negative   Palpable tenderness: Tender to palpation in the paraspinal musculature lumbar spine right greater than left. FABER: negative. Sensory change: Gross sensation intact to all lumbar and sacral dermatomes.  Reflexes: 2+ at both patellar tendons, 2+ at achilles tendons, Babinski's downgoing.  Strength at foot  Plantar-flexion: 5/5 Dorsi-flexion: 5/5 Eversion: 5/5 Inversion: 5/5  Leg strength  Quad: 5/5 Hamstring: 5/5 Hip flexor: 5/5 Hip abductors: 4/5 but symmetric Gait unremarkable.  [id number]; 15 additional minutes spent for Therapeutic exercises as stated in above notes.  This included exercises focusing on stretching, strengthening, with significant focus on eccentric aspects.   Long term goals include an improvement in range of motion, strength, endurance as well as avoiding reinjury. Patient's frequency would include in 1-2 times a day, 3-5 times a week for a duration of 6-12 weeks. Low back exercises that included:  Pelvic tilt/bracing instruction to focus on control of the pelvic girdle and lower abdominal muscles   Glute strengthening exercises, focusing on proper firing of the glutes without engaging the low back muscles Proper stretching techniques for maximum relief for the hamstrings, hip flexors, low back and some rotation where tolerated   Proper technique shown and discussed handout in great detail with ATC.  All questions were discussed and answered.     Impression and Recommendations:     This case required medical decision making of moderate complexity.      Note: This dictation was prepared with Dragon dictation along with smaller phrase technology. Any transcriptional errors that result from this process are unintentional.

## 2017-07-12 ENCOUNTER — Ambulatory Visit (INDEPENDENT_AMBULATORY_CARE_PROVIDER_SITE_OTHER): Payer: BLUE CROSS/BLUE SHIELD | Admitting: Family Medicine

## 2017-07-12 ENCOUNTER — Ambulatory Visit (INDEPENDENT_AMBULATORY_CARE_PROVIDER_SITE_OTHER)
Admission: RE | Admit: 2017-07-12 | Discharge: 2017-07-12 | Disposition: A | Discharge status: Home / Self Care | Payer: BLUE CROSS/BLUE SHIELD | Source: Ambulatory Visit | Attending: Family Medicine | Admitting: Family Medicine

## 2017-07-12 ENCOUNTER — Encounter: Payer: Self-pay | Admitting: Family Medicine

## 2017-07-12 VITALS — BP 140/82 | HR 56 | Ht 76.0 in | Wt 305.0 lb

## 2017-07-12 DIAGNOSIS — G8929 Other chronic pain: Secondary | ICD-10-CM

## 2017-07-12 DIAGNOSIS — M545 Low back pain, unspecified: Secondary | ICD-10-CM

## 2017-07-12 MED ORDER — VITAMIN D (ERGOCALCIFEROL) 1.25 MG (50000 UNIT) PO CAPS: 50000.0000 [IU] | ORAL_CAPSULE | ORAL | 0 refills | Status: DC

## 2017-07-12 MED ORDER — GABAPENTIN 100 MG PO CAPS: 200.0000 mg | ORAL_CAPSULE | Freq: Every day | ORAL | 3 refills | Status: DC

## 2017-07-12 MED ORDER — PREDNISONE 50 MG PO TABS: 50.0000 mg | ORAL_TABLET | Freq: Every day | ORAL | 0 refills | Status: DC

## 2017-07-12 NOTE — Patient Instructions (Signed)
Great to see you  Ice 20 minutes 2 times daily. Usually after activity and before bed. Exercises 3 times a week.   Gabapentin 200mg  at night Once weekly vitamin D for 12 weeks If in a lot of pain in 2 days take prednisone daily for 5 days Over the counter get  Turmeric 500mg  daily  Tart cherry extract any dose at night Xray downstairs See me again in 3 weeks

## 2017-07-12 NOTE — Assessment & Plan Note (Signed)
Low back pain.  Patient is likely having an exacerbation of some mild underlying arthritis as well as tightness of the hip flexor and the iliac is.  Home exercises given and patient work with Product/process development scientist today.  We discussed icing regimen and home exercises.  Discussed which activities to doing which wants to avoid.  Patient was to increase activity as tolerated.  Patient will follow up with me again 4-6 weeks worsening symptoms we will consider physical therapy manipulation.

## 2017-08-03 NOTE — Progress Notes (Signed)
[facility] [place] [place], [place] [address] Phone: [phone] Subjective:     CC: Low back pain  IRJ:[id number]  Antonio Reyes is a 72 y.o. male coming in with complaint of back pain. He has had improvement from last visit. He does still have pain though and notes that this morning he had pain when he awoke. Patient is a side sleeper and notes that this causes him the greatest pain.  Patient states that he is 98% better at this time.  No radicular symptoms.      Past Medical History:  Diagnosis Date  . Anxiety   . Benign neoplasm of stomach   . BPH (benign prostatic hypertrophy)   . Cancer ([place]) 2000   skin cancer   . Diverticulitis   . ED (erectile dysfunction)   . GERD (gastroesophageal reflux disease)   . History of herpes genitalis   . HLD (hyperlipidemia)   . HTN (hypertension)   . Low back pain   . Migraine   . Rotator cuff tear   . Sleep apnea    CPAP  . Tubular adenoma of colon 05/2008   Past Surgical History:  Procedure Laterality Date  . COLONOSCOPY    . HERNIA REPAIR    . KNEE SURGERY     mensicus  . POLYPECTOMY     Social History   Socioeconomic History  . Marital status: Married    Spouse name: Not on file  . Number of children: 1  . Years of education: Not on file  . Highest education level: Not on file  Occupational History  . Occupation: Magazine features editor)  Social Needs  . Financial resource strain: Not on file  . Food insecurity:    Worry: Not on file    Inability: Not on file  . Transportation needs:    Medical: Not on file    Non-medical: Not on file  Tobacco Use  . Smoking status: Current Some Day Smoker    Types: Cigars  . Smokeless tobacco: Never Used  . Tobacco comment: occasionally  Substance and Sexual Activity  . Alcohol use: Yes    Alcohol/week: 0.6 oz    Types: 1 Standard drinks or equivalent per week    Comment: social 2 beers a month  . Drug use: No  . Sexual  activity: Not on file  Lifestyle  . Physical activity:    Days per week: Not on file    Minutes per session: Not on file  . Stress: Not on file  Relationships  . Social connections:    Talks on phone: Not on file    Gets together: Not on file    Attends religious service: Not on file    Active member of club or organization: Not on file    Attends meetings of clubs or organizations: Not on file    Relationship status: Not on file  Other Topics Concern  . Not on file  Social History Narrative  . Not on file   Allergies  Allergen Reactions  . Olive Tree Other (See Comments)   Family History  Problem Relation Age of Onset  . Colon cancer Mother 59     Past medical history, social, surgical and family history all reviewed in electronic medical record.  No pertanent information unless stated regarding to the chief complaint.   Review of Systems:Review of systems updated and as accurate as of 08/04/17  No headache, visual  changes, nausea, vomiting, diarrhea, constipation, dizziness, abdominal pain, skin rash, fevers, chills, night sweats, weight loss, swollen lymph nodes, body aches, joint swelling, muscle aches, chest pain, shortness of breath, mood changes.   Objective  Blood pressure 102/70, pulse 60, height 6\' 4"  (1.93 m), weight 300 lb (136.1 kg), SpO2 95 %. Systems examined below as of 08/04/17   General: No apparent distress alert and oriented x3 mood and affect normal, dressed appropriately.  HEENT: Pupils equal, extraocular movements intact  Respiratory: Patient's speak in full sentences and does not appear short of breath  Cardiovascular: No lower extremity edema, non tender, no erythema  Skin: Warm dry intact with no signs of infection or rash on extremities or on axial skeleton.  Abdomen: Soft nontender  Neuro: Cranial nerves II through XII are intact, neurovascularly intact in all extremities with 2+ DTRs and 2+ pulses.  Lymph: No lymphadenopathy of posterior or  anterior cervical chain or axillae bilaterally.  Gait normal with good balance and coordination.  MSK:  Non tender with full range of motion and good stability and symmetric strength and tone of shoulders, elbows, wrist, hip, knee and ankles bilaterally.  Back Exam:  Inspection: Mild loss of lordosis Motion: Flexion 45 deg, Extension 25 deg, Side Bending to 45 deg bilaterally,  Rotation to 35 deg bilaterally  SLR laying: Negative  XSLR laying: Negative  Palpable tenderness: Mild tenderness in the lumbosacral area mostly on the right side. FABER: negative. Sensory change: Gross sensation intact to all lumbar and sacral dermatomes.  Reflexes: 2+ at both patellar tendons, 2+ at achilles tendons, Babinski's downgoing.  Strength at foot  Plantar-flexion: 5/5 Dorsi-flexion: 5/5 Eversion: 5/5 Inversion: 5/5  Leg strength  Quad: 5/5 Hamstring: 5/5 Hip flexor: 5/5 Hip abductors: 5/5  Gait unremarkable.  Osteopathic findings T9 extended rotated and side bent left L5 flexed rotated and side bent right Sacrum right on right     Impression and Recommendations:     This case required medical decision making of moderate complexity.      Note: This dictation was prepared with Dragon dictation along with smaller phrase technology. Any transcriptional errors that result from this process are unintentional.

## 2017-08-04 ENCOUNTER — Encounter: Payer: Self-pay | Admitting: Family Medicine

## 2017-08-04 ENCOUNTER — Ambulatory Visit: Payer: BLUE CROSS/BLUE SHIELD | Admitting: Family Medicine

## 2017-08-04 DIAGNOSIS — M545 Low back pain: Secondary | ICD-10-CM | POA: Diagnosis not present

## 2017-08-04 DIAGNOSIS — G8929 Other chronic pain: Secondary | ICD-10-CM | POA: Diagnosis not present

## 2017-08-04 DIAGNOSIS — M999 Biomechanical lesion, unspecified: Secondary | ICD-10-CM | POA: Insufficient documentation

## 2017-08-04 NOTE — Assessment & Plan Note (Signed)
Decision today to treat with OMT was based on Physical Exam  After verbal consent patient was treated with HVLA, ME, FPR techniques in  thoracic, lumbar and sacral areas  Patient tolerated the procedure well with improvement in symptoms  Patient given exercises, stretches and lifestyle modifications  See medications in patient instructions if given  Patient will follow up in 4-8 weeks 

## 2017-08-04 NOTE — Assessment & Plan Note (Signed)
Significant improvement at this time.  Discussed icing regimen and home exercise.  Continue the home exercises intermittently.  Can continue gabapentin at night.  Follow-up with me again in 4 to 6 weeks.  Responded very well to manipulation

## 2017-08-04 NOTE — Patient Instructions (Signed)
Good to see you  Tried manipulation and I think you will do great  Continue the exercises and especially after a lot of activity  Gabapentin still is fine and can go from 0-3 pills when needed Ice is your friend See me again in 6-8 weeks

## 2017-08-25 ENCOUNTER — Ambulatory Visit: Payer: BLUE CROSS/BLUE SHIELD | Admitting: Internal Medicine

## 2017-08-25 ENCOUNTER — Encounter: Payer: Self-pay | Admitting: Internal Medicine

## 2017-08-25 VITALS — BP 124/86 | HR 63 | Temp 98.4°F | Ht 76.0 in | Wt 300.0 lb

## 2017-08-25 DIAGNOSIS — I10 Essential (primary) hypertension: Secondary | ICD-10-CM | POA: Diagnosis not present

## 2017-08-25 DIAGNOSIS — J069 Acute upper respiratory infection, unspecified: Secondary | ICD-10-CM

## 2017-08-25 DIAGNOSIS — R7302 Impaired glucose tolerance (oral): Secondary | ICD-10-CM

## 2017-08-25 MED ORDER — AZITHROMYCIN 250 MG PO TABS: ORAL_TABLET | ORAL | 1 refills | Status: DC

## 2017-08-25 MED ORDER — HYDROCODONE-HOMATROPINE 5-1.5 MG/5ML PO SYRP: 5.0000 mL | ORAL_SOLUTION | Freq: Four times a day (QID) | ORAL | 0 refills | Status: DC | PRN

## 2017-08-25 NOTE — Patient Instructions (Signed)
Please take all new medication as prescribed - the antibiotic, and cough medicine if needed  Please continue all other medications as before, and refills have been done if requested.  Please have the pharmacy call with any other refills you may need.  Please keep your appointments with your specialists as you may have planned     

## 2017-08-25 NOTE — Progress Notes (Signed)
Subjective:    Patient ID: Antonio Reyes, male    DOB: [birth year], 72 y.o.   MRN: [id number]  HPI   Here with 2-3 days acute onset fever, facial pain, pressure, headache, general weakness and malaise, and greenish d/c, with mild ST and cough, but pt denies chest pain, wheezing, increased sob or doe, orthopnea, PND, increased LE swelling, palpitations, dizziness or syncope.  Pt denies new neurological symptoms such as new headache, or facial or extremity weakness or numbness  Pt denies polydipsia, polyuria  Past Medical History:  Diagnosis Date  . Anxiety   . Benign neoplasm of stomach   . BPH (benign prostatic hypertrophy)   . Cancer ([place]) 2000   skin cancer   . Diverticulitis   . ED (erectile dysfunction)   . GERD (gastroesophageal reflux disease)   . History of herpes genitalis   . HLD (hyperlipidemia)   . HTN (hypertension)   . Low back pain   . Migraine   . Rotator cuff tear   . Sleep apnea    CPAP  . Tubular adenoma of colon 05/2008   Past Surgical History:  Procedure Laterality Date  . COLONOSCOPY    . HERNIA REPAIR    . KNEE SURGERY     mensicus  . POLYPECTOMY      reports that he has been smoking cigars.  He has never used smokeless tobacco. He reports that he drinks about 0.6 oz of alcohol per week. He reports that he does not use drugs. family history includes Colon cancer (age of onset: 11) in his mother. Allergies  Allergen Reactions  . Olive Tree Other (See Comments)   Current Outpatient Medications on File Prior to Visit  Medication Sig Dispense Refill  . aspirin 81 MG tablet Take 81 mg by mouth daily.      Marland Kitchen atorvastatin (LIPITOR) 40 MG tablet TAKE 1 TABLET BY MOUTH DAILY GENERIC EQUIVALENT FOR LIPITOR 90 tablet 3  . fluticasone (FLONASE) 50 MCG/ACT nasal spray Place 2 sprays into both nostrils daily. 48 g 1  . gabapentin (NEURONTIN) 100 MG capsule Take 2 capsules (200 mg total) by mouth at bedtime. 60 capsule 3  . metoprolol succinate (TOPROL-XL) 50 MG  24 hr tablet TAKE 1 TABLET BY MOUTH DAILY WITH OR IMMEDIATELY FOLLOWING A MEAL 90 tablet 3  . montelukast (SINGULAIR) 10 MG tablet TAKE 1 TABLET BY MOUTH DAILY GENERIC EQUIVALENT FOR SINGULAIR 90 tablet 2  . Vitamin D, Ergocalciferol, (DRISDOL) 50000 units CAPS capsule Take 1 capsule (50,000 Units total) by mouth every 7 (seven) days. 12 capsule 0   No current facility-administered medications on file prior to visit.    Review of Systems  Constitutional: Negative for other unusual diaphoresis or sweats HENT: Negative for ear discharge or swelling Eyes: Negative for other worsening visual disturbances Respiratory: Negative for stridor or other swelling  Gastrointestinal: Negative for worsening distension or other blood Genitourinary: Negative for retention or other urinary change Musculoskeletal: Negative for other MSK pain or swelling Skin: Negative for color change or other new lesions Neurological: Negative for worsening tremors and other numbness  Psychiatric/Behavioral: Negative for worsening agitation or other fatigue All other system neg per pt    Objective:   Physical Exam BP 124/86   Pulse 63   Temp 98.4 F (36.9 C) (Oral)   Ht 6\' 4"  (1.93 m)   Wt 300 lb (136.1 kg)   SpO2 96%   BMI 36.52 kg/m  VS noted, mild ill  Constitutional:  Pt appears in NAD HENT: Head: NCAT.  Right Ear: External ear normal.  Left Ear: External ear normal.  Eyes: . Pupils are equal, round, and reactive to light. Conjunctivae and EOM are normal Bilat tm's with mild erythema.  Max sinus areas mild tender.  Pharynx with mild erythema, no exudateNose: without d/c or deformity Neck: Neck supple. Gross normal ROM Cardiovascular: Normal rate and regular rhythm.   Pulmonary/Chest: Effort normal and breath sounds without rales or wheezing.  Neurological: Pt is alert. At baseline orientation, motor grossly intact Skin: Skin is warm. No rashes, other new lesions, no LE edema Psychiatric: Pt behavior is  normal without agitation  No other exam findings    Assessment & Plan:

## 2017-08-26 ENCOUNTER — Ambulatory Visit: Payer: Self-pay

## 2017-08-26 MED ORDER — HYDROCODONE-HOMATROPINE 5-1.5 MG/5ML PO SYRP: 5.0000 mL | ORAL_SOLUTION | Freq: Four times a day (QID) | ORAL | 0 refills | Status: AC | PRN

## 2017-08-26 MED ORDER — AZITHROMYCIN 250 MG PO TABS: ORAL_TABLET | ORAL | 1 refills | Status: DC

## 2017-08-26 NOTE — Telephone Encounter (Signed)
Ok, this is done 

## 2017-08-26 NOTE — Telephone Encounter (Addendum)
Pt currently at [facility] in [place]

## 2017-08-26 NOTE — Telephone Encounter (Signed)
Pt. Seen in office yesterday and started on an antibiotic and cough medicine. Their [facility] reports medication is on back order. Requests it be sent to [facility] at [facility]. He is also still running a fever on and off. Temp 100 today - treating with Motrin.  Answer Assessment - Initial Assessment Questions 1. ONSET: "When did the cough begin?"      Started 2 days 2. SEVERITY: "How bad is the cough today?"      Severe 3. RESPIRATORY DISTRESS: "Describe your breathing."      No 4. FEVER: "Do you have a fever?" If so, ask: "What is your temperature, how was it measured, and when did it start?"     100 5. SPUTUM: "Describe the color of your sputum" (clear, white, yellow, green)     Green 6. HEMOPTYSIS: "Are you coughing up any blood?" If so ask: "How much?" (flecks, streaks, tablespoons, etc.)     No 7. CARDIAC HISTORY: "Do you have any history of heart disease?" (e.g., heart attack, congestive heart failure)      HTN 8. LUNG HISTORY: "Do you have any history of lung disease?"  (e.g., pulmonary embolus, asthma, emphysema)     No Sleep apnea 9. PE RISK FACTORS: "Do you have a history of blood clots?" (or: recent major surgery, recent prolonged travel, bedridden )     No 10. OTHER SYMPTOMS: "Do you have any other symptoms?" (e.g., runny nose, wheezing, chest pain)       No 11. PREGNANCY: "Is there any chance you are pregnant?" "When was your last menstrual period?"       N/a 12. TRAVEL: "Have you traveled out of the country in the last month?" (e.g., travel history, exposures)       No  Protocols used: Lillian

## 2017-08-28 NOTE — Assessment & Plan Note (Signed)
stable overall by history and exam, recent data reviewed with pt, and pt to continue medical treatment as before,  to f/u any worsening symptoms or concerns Lab Results  Component Value Date   HGBA1C 6.0 04/14/2017

## 2017-08-28 NOTE — Assessment & Plan Note (Signed)
Mild to mod, for antibx course,  to f/u any worsening symptoms or concerns 

## 2017-08-28 NOTE — Assessment & Plan Note (Signed)
BP Readings from Last 3 Encounters:  08/25/17 124/86  08/04/17 102/70  07/12/17 140/82  stable overall by history and exam, recent data reviewed with pt, and pt to continue medical treatment as before,  to f/u any worsening symptoms or concerns

## 2017-09-16 NOTE — Progress Notes (Signed)
[facility] [address] [place], [place] [address] Phone: [phone] Subjective:     CC: Back pain follow-up  QIO:[id number]  Antonio Reyes is a 72 y.o. male coming in with complaint of back pain. He has sharp pain with overhead motions and some rotational motions. Patient feels that he is doing better than last visit.  Discussed with patient about doing icing regimen and home exercises.  Patient has been doing remarkably better.  Some discomfort from time to time but nothing severe.  Happy with the vitamins with no side effects     Past Medical History:  Diagnosis Date  . Anxiety   . Benign neoplasm of stomach   . BPH (benign prostatic hypertrophy)   . Cancer ([place]) 2000   skin cancer   . Diverticulitis   . ED (erectile dysfunction)   . GERD (gastroesophageal reflux disease)   . History of herpes genitalis   . HLD (hyperlipidemia)   . HTN (hypertension)   . Low back pain   . Migraine   . Rotator cuff tear   . Sleep apnea    CPAP  . Tubular adenoma of colon 05/2008   Past Surgical History:  Procedure Laterality Date  . COLONOSCOPY    . HERNIA REPAIR    . KNEE SURGERY     mensicus  . POLYPECTOMY     Social History   Socioeconomic History  . Marital status: Married    Spouse name: Not on file  . Number of children: 1  . Years of education: Not on file  . Highest education level: Not on file  Occupational History  . Occupation: Magazine features editor)  Social Needs  . Financial resource strain: Not on file  . Food insecurity:    Worry: Not on file    Inability: Not on file  . Transportation needs:    Medical: Not on file    Non-medical: Not on file  Tobacco Use  . Smoking status: Current Some Day Smoker    Types: Cigars  . Smokeless tobacco: Never Used  . Tobacco comment: occasionally  Substance and Sexual Activity  . Alcohol use: Yes    Alcohol/week: 0.6 oz    Types: 1 Standard drinks or equivalent per week   Comment: social 2 beers a month  . Drug use: No  . Sexual activity: Not on file  Lifestyle  . Physical activity:    Days per week: Not on file    Minutes per session: Not on file  . Stress: Not on file  Relationships  . Social connections:    Talks on phone: Not on file    Gets together: Not on file    Attends religious service: Not on file    Active member of club or organization: Not on file    Attends meetings of clubs or organizations: Not on file    Relationship status: Not on file  Other Topics Concern  . Not on file  Social History Narrative  . Not on file   Allergies  Allergen Reactions  . Olive Tree Other (See Comments)   Family History  Problem Relation Age of Onset  . Colon cancer Mother 41     Past medical history, social, surgical and family history all reviewed in electronic medical record.  No pertanent information unless stated regarding to the chief complaint.   Review of Systems:Review of systems updated and as accurate as of 09/19/17  No  headache, visual changes, nausea, vomiting, diarrhea, constipation, dizziness, abdominal pain, skin rash, fevers, chills, night sweats, weight loss, swollen lymph nodes, body aches, joint swelling, muscle aches, chest pain, shortness of breath, mood changes.   Objective  Blood pressure 128/84, pulse 64, height 6\' 4"  (1.93 m), SpO2 98 %. Systems examined below as of 09/19/17   General: No apparent distress alert and oriented x3 mood and affect normal, dressed appropriately.  HEENT: Pupils equal, extraocular movements intact  Respiratory: Patient's speak in full sentences and does not appear short of breath  Cardiovascular: No lower extremity edema, non tender, no erythema  Skin: Warm dry intact with no signs of infection or rash on extremities or on axial skeleton.  Abdomen: Soft nontender  Neuro: Cranial nerves II through XII are intact, neurovascularly intact in all extremities with 2+ DTRs and 2+ pulses.  Lymph: No  lymphadenopathy of posterior or anterior cervical chain or axillae bilaterally.  Gait normal with good balance and coordination.  MSK:  Non tender with full range of motion and good stability and symmetric strength and tone of shoulders, elbows, wrist, hip, knee and ankles bilaterally.  Back Exam:  Inspection: Unremarkable  Motion: Flexion 35 deg, Extension 25 deg, Side Bending to 25 deg bilaterally,  Rotation to 45 deg bilaterally  SLR laying: Negative  XSLR laying: Negative  Palpable tenderness: Tender to palpation the paraspinal musculature lumbar spine right greater than left. FABER: Positive Faber bilaterally. Sensory change: Gross sensation intact to all lumbar and sacral dermatomes.  Reflexes: 2+ at both patellar tendons, 2+ at achilles tendons, Babinski's downgoing.  Strength at foot  Plantar-flexion: 5/5 Dorsi-flexion: 5/5 Eversion: 5/5 Inversion: 5/5  Leg strength  Quad: 5/5 Hamstring: 5/5 Hip flexor: 5/5 Hip abductors: 4/5 but symmetric Gait unremarkable.  Osteopathic findings C2 flexed rotated and side bent right T3 extended rotated and side bent right T5 extended rotated and side bent left L1 flexed rotated and side bent right Sacrum right on right     Impression and Recommendations:     This case required medical decision making of moderate complexity.      Note: This dictation was prepared with Dragon dictation along with smaller phrase technology. Any transcriptional errors that result from this process are unintentional.

## 2017-09-19 ENCOUNTER — Encounter: Payer: Self-pay | Admitting: Family Medicine

## 2017-09-19 ENCOUNTER — Telehealth: Payer: Self-pay | Admitting: Internal Medicine

## 2017-09-19 ENCOUNTER — Ambulatory Visit: Payer: BLUE CROSS/BLUE SHIELD | Admitting: Family Medicine

## 2017-09-19 VITALS — BP 128/84 | HR 64 | Ht 76.0 in

## 2017-09-19 DIAGNOSIS — M999 Biomechanical lesion, unspecified: Secondary | ICD-10-CM

## 2017-09-19 DIAGNOSIS — M545 Low back pain: Secondary | ICD-10-CM

## 2017-09-19 DIAGNOSIS — G8929 Other chronic pain: Secondary | ICD-10-CM | POA: Diagnosis not present

## 2017-09-19 NOTE — Telephone Encounter (Signed)
Patient is scheduled for 7/2.

## 2017-09-19 NOTE — Assessment & Plan Note (Signed)
Decision today to treat with OMT was based on Physical Exam  After verbal consent patient was treated with HVLA, ME, FPR techniques in cervical, thoracic, lumbar and sacral areas  Patient tolerated the procedure well with improvement in symptoms  Patient given exercises, stretches and lifestyle modifications  See medications in patient instructions if given  Patient will follow up in 1-2 weeks 

## 2017-09-19 NOTE — Telephone Encounter (Signed)
Noted  

## 2017-09-19 NOTE — Assessment & Plan Note (Signed)
Improved overall. Discussed icing  Discussed HEP Discussed which activities of doing which wants to avoid Discussed ergonomics as well as core strengthening.  Follow-up again in 3 months

## 2017-09-19 NOTE — Patient Instructions (Signed)
Keep doing what you are doing  Ice is your friend Continue the vitamins See me again in 3 months

## 2017-09-19 NOTE — Telephone Encounter (Signed)
Patient states he still has some cough and congestion left from the last OV in June.  Patient would like to know if Dr. Jenny Reichmann would call him something into the pharmacy or if he would need to come in for an OV?

## 2017-09-19 NOTE — Telephone Encounter (Signed)
It would seem very unlikely that infection would still be present, unless there is a new infectio.  Also, allergies and even reflux can lead to cough that persists.  Please consider ROV

## 2017-09-20 ENCOUNTER — Encounter: Payer: Self-pay | Admitting: Internal Medicine

## 2017-09-20 ENCOUNTER — Ambulatory Visit (INDEPENDENT_AMBULATORY_CARE_PROVIDER_SITE_OTHER)
Admission: RE | Admit: 2017-09-20 | Discharge: 2017-09-20 | Disposition: A | Discharge status: Home / Self Care | Payer: BLUE CROSS/BLUE SHIELD | Source: Ambulatory Visit | Attending: Internal Medicine | Admitting: Internal Medicine

## 2017-09-20 ENCOUNTER — Ambulatory Visit: Payer: BLUE CROSS/BLUE SHIELD | Admitting: Internal Medicine

## 2017-09-20 VITALS — BP 126/88 | HR 67 | Temp 98.6°F | Ht 76.0 in | Wt 295.0 lb

## 2017-09-20 DIAGNOSIS — R05 Cough: Secondary | ICD-10-CM

## 2017-09-20 DIAGNOSIS — R739 Hyperglycemia, unspecified: Secondary | ICD-10-CM

## 2017-09-20 DIAGNOSIS — R059 Cough, unspecified: Secondary | ICD-10-CM | POA: Insufficient documentation

## 2017-09-20 DIAGNOSIS — I10 Essential (primary) hypertension: Secondary | ICD-10-CM | POA: Diagnosis not present

## 2017-09-20 DIAGNOSIS — E7800 Pure hypercholesterolemia, unspecified: Secondary | ICD-10-CM | POA: Diagnosis not present

## 2017-09-20 DIAGNOSIS — J309 Allergic rhinitis, unspecified: Secondary | ICD-10-CM | POA: Diagnosis not present

## 2017-09-20 MED ORDER — BENZONATATE 100 MG PO CAPS: ORAL_CAPSULE | ORAL | 1 refills | Status: DC

## 2017-09-20 NOTE — Assessment & Plan Note (Signed)
stable overall by history and exam, recent data reviewed with pt, and pt to continue medical treatment as before,  to f/u any worsening symptoms or concerns BP Readings from Last 3 Encounters:  09/20/17 126/88  09/19/17 128/84  08/25/17 124/86

## 2017-09-20 NOTE — Patient Instructions (Signed)
Please take all new medication as prescribed - the tessalon perles  Please continue all other medications as before, including restarting the flonase  Please have the pharmacy call with any other refills you may need.  Please continue your efforts at being more active, low cholesterol diet, and weight control.  Please keep your appointments with your specialists as you may have planned  Please go to the XRAY Department in the Basement (go straight as you get off the elevator) for the x-ray testing  /You will be contacted by phone if any changes need to be made immediately.  Otherwise, you will receive a letter about your results with an explanation, but please check with MyChart first.  Please remember to sign up for MyChart if you have not done so, as this will be important to you in the future with finding out test results, communicating by private email, and scheduling acute appointments online when needed.

## 2017-09-20 NOTE — Assessment & Plan Note (Signed)
Suspect post infectious residual cough +/- allergic post nasal gtt, for cxr r/o pulmonary, ok for tessalon perle prn

## 2017-09-20 NOTE — Assessment & Plan Note (Signed)
Lab Results  Component Value Date   HGBA1C 6.0 04/14/2017   stable overall by history and exam, recent data reviewed with pt, and pt to continue medical treatment as before,  to f/u any worsening symptoms or concerns

## 2017-09-20 NOTE — Assessment & Plan Note (Signed)
stable overall by history and exam, recent data reviewed with pt, and pt to continue medical treatment as before,  to f/u any worsening symptoms or concerns Lab Results  Component Value Date   LDLCALC 54 04/14/2017

## 2017-09-20 NOTE — Assessment & Plan Note (Signed)
Ok to restart the flonase asd,  to f/u any worsening symptoms or concerns 

## 2017-09-20 NOTE — Progress Notes (Signed)
Subjective:    Patient ID: Antonio Reyes, male    DOB: 24-Aug-1945, 72 y.o.   MRN: [id number]  HPI   Here with persistent scant prod whitish cough that seems to be the only residual from recent infectious symptoms as per last visit, and assoc with phlegm in the back of the throat worse with lying down.  Pt denies chest pain, increased sob or doe, wheezing, orthopnea, PND, increased LE swelling, palpitations, dizziness or syncope.  Pt denies new neurological symptoms such as new headache, or facial or extremity weakness or numbness   Ran out of flonase nasal, plans to restart.  Never obtained or used the cough prep from last visit, as [employer] was out.  Wife is after him to stop coughing, and he did try one of her tessalon perle that seemed to help.  Delsym and mucinex not helped Wt Readings from Last 3 Encounters:  09/20/17 295 lb (133.8 kg)  08/25/17 300 lb (136.1 kg)  08/04/17 300 lb (136.1 kg)   BP Readings from Last 3 Encounters:  09/20/17 126/88  09/19/17 128/84  08/25/17 124/86   Past Medical History:  Diagnosis Date  . Anxiety   . Benign neoplasm of stomach   . BPH (benign prostatic hypertrophy)   . Cancer ([place]) 2000   skin cancer   . Diverticulitis   . ED (erectile dysfunction)   . GERD (gastroesophageal reflux disease)   . History of herpes genitalis   . HLD (hyperlipidemia)   . HTN (hypertension)   . Low back pain   . Migraine   . Rotator cuff tear   . Sleep apnea    CPAP  . Tubular adenoma of colon 05/2008   Past Surgical History:  Procedure Laterality Date  . COLONOSCOPY    . HERNIA REPAIR    . KNEE SURGERY     mensicus  . POLYPECTOMY      reports that he has been smoking cigars.  He has never used smokeless tobacco. He reports that he drinks about 0.6 oz of alcohol per week. He reports that he does not use drugs. family history includes Colon cancer (age of onset: 65) in his mother. Allergies  Allergen Reactions  . [place] Other (See Comments)    Current Outpatient Medications on File Prior to Visit  Medication Sig Dispense Refill  . aspirin 81 MG tablet Take 81 mg by mouth daily.      Marland Kitchen atorvastatin (LIPITOR) 40 MG tablet TAKE 1 TABLET BY MOUTH DAILY GENERIC EQUIVALENT FOR LIPITOR 90 tablet 3  . fluticasone (FLONASE) 50 MCG/ACT nasal spray Place 2 sprays into both nostrils daily. 48 g 1  . gabapentin (NEURONTIN) 100 MG capsule Take 2 capsules (200 mg total) by mouth at bedtime. 60 capsule 3  . metoprolol succinate (TOPROL-XL) 50 MG 24 hr tablet TAKE 1 TABLET BY MOUTH DAILY WITH OR IMMEDIATELY FOLLOWING A MEAL 90 tablet 3  . montelukast (SINGULAIR) 10 MG tablet TAKE 1 TABLET BY MOUTH DAILY GENERIC EQUIVALENT FOR SINGULAIR 90 tablet 2   No current facility-administered medications on file prior to visit.    Review of Systems  Constitutional: Negative for other unusual diaphoresis or sweats HENT: Negative for ear discharge or swelling Eyes: Negative for other worsening visual disturbances Respiratory: Negative for stridor or other swelling  Gastrointestinal: Negative for worsening distension or other blood Genitourinary: Negative for retention or other urinary change Musculoskeletal: Negative for other MSK pain or swelling Skin: Negative for color change or other  new lesions Neurological: Negative for worsening tremors and other numbness  Psychiatric/Behavioral: Negative for worsening agitation or other fatigue All other system neg per pt    Objective:   Physical Exam BP 126/88   Pulse 67   Temp 98.6 F (37 C) (Oral)   Ht 6\' 4"  (1.93 m)   Wt 295 lb (133.8 kg)   SpO2 95%   BMI 35.91 kg/m  VS noted, not ill appearing Constitutional: Pt appears in NAD HENT: Head: NCAT.  Right Ear: External ear normal.  Left Ear: External ear normal.  Eyes: . Pupils are equal, round, and reactive to light. Conjunctivae and EOM are normal Nose: without d/c or deformity Neck: Neck supple. Gross normal ROM Cardiovascular: Normal rate and  regular rhythm.   Pulmonary/Chest: Effort normal and breath sounds without rales or wheezing.  Abd:  Soft, NT, ND, + BS, no organomegaly Neurological: Pt is alert. At baseline orientation, motor grossly intact Skin: Skin is warm. No rashes, other new lesions, no LE edema Psychiatric: Pt behavior is normal without agitation  No other exam findings  Lab Results  Component Value Date   WBC 8.2 04/14/2017   HGB 17.3 (H) 04/14/2017   HCT 51.5 04/14/2017   PLT 252.0 04/14/2017   GLUCOSE 133 (H) 04/14/2017   CHOL 110 04/14/2017   TRIG 106.0 04/14/2017   HDL 34.10 (L) 04/14/2017   LDLDIRECT 148.7 04/12/2008   LDLCALC 54 04/14/2017   ALT 32 04/14/2017   AST 21 04/14/2017   NA 140 04/14/2017   K 5.0 04/14/2017   CL 102 04/14/2017   CREATININE 1.31 04/14/2017   BUN 19 04/14/2017   CO2 29 04/14/2017   TSH 2.59 04/14/2017   PSA 1.07 04/14/2017   HGBA1C 6.0 04/14/2017       Assessment & Plan:

## 2017-10-04 ENCOUNTER — Telehealth: Payer: Self-pay

## 2017-10-04 NOTE — Telephone Encounter (Signed)
Called pt, LVM with negative xray results.  Copied from [place] [phone]. Topic: General - Other >> Oct 04, 2017  9:15 AM [name] wrote: Pt would like a callback about his chest xray from 09/20/2017  CB# [phone]

## 2017-10-06 ENCOUNTER — Telehealth: Payer: Self-pay | Admitting: Internal Medicine

## 2017-10-06 MED ORDER — FLUTICASONE PROPIONATE 50 MCG/ACT NA SUSP: 2.0000 | Freq: Every day | NASAL | 1 refills | Status: DC

## 2017-10-06 NOTE — Telephone Encounter (Signed)
Copied from [place] [id number]. Topic: Quick Communication - See Telephone Encounter >> Oct 06, 2017 11:33 AM Hewitt Shorts wrote: [facility] is calling to get a refill on flonase   [phone] and fax number is [phone]

## 2018-03-18 ENCOUNTER — Other Ambulatory Visit: Payer: Self-pay | Admitting: Internal Medicine

## 2018-04-18 ENCOUNTER — Other Ambulatory Visit (INDEPENDENT_AMBULATORY_CARE_PROVIDER_SITE_OTHER): Payer: BLUE CROSS/BLUE SHIELD

## 2018-04-18 DIAGNOSIS — Z125 Encounter for screening for malignant neoplasm of prostate: Secondary | ICD-10-CM

## 2018-04-18 DIAGNOSIS — Z0000 Encounter for general adult medical examination without abnormal findings: Secondary | ICD-10-CM

## 2018-04-18 DIAGNOSIS — R7302 Impaired glucose tolerance (oral): Secondary | ICD-10-CM

## 2018-04-18 LAB — TSH: TSH: 2.78 u[IU]/mL (ref 0.35–4.50)

## 2018-04-18 LAB — URINALYSIS, ROUTINE W REFLEX MICROSCOPIC
Bilirubin Urine: NEGATIVE
Hgb urine dipstick: NEGATIVE
KETONES UR: NEGATIVE
Leukocytes, UA: NEGATIVE
NITRITE: NEGATIVE
PH: 5.5 (ref 5.0–8.0)
RBC / HPF: NONE SEEN (ref 0–?)
SPECIFIC GRAVITY, URINE: 1.02 (ref 1.000–1.030)
TOTAL PROTEIN, URINE-UPE24: NEGATIVE
URINE GLUCOSE: NEGATIVE
Urobilinogen, UA: 0.2 (ref 0.0–1.0)
WBC, UA: NONE SEEN (ref 0–?)

## 2018-04-18 LAB — CBC WITH DIFFERENTIAL/PLATELET
Basophils Absolute: 0.1 10*3/uL (ref 0.0–0.1)
Basophils Relative: 0.9 % (ref 0.0–3.0)
Eosinophils Absolute: 0.1 10*3/uL (ref 0.0–0.7)
Eosinophils Relative: 2.1 % (ref 0.0–5.0)
HCT: 49.7 % (ref 39.0–52.0)
Hemoglobin: 16.9 g/dL (ref 13.0–17.0)
LYMPHS PCT: 33.3 % (ref 12.0–46.0)
Lymphs Abs: 2.4 10*3/uL (ref 0.7–4.0)
MCHC: 34 g/dL (ref 30.0–36.0)
MCV: 91.1 fl (ref 78.0–100.0)
Monocytes Absolute: 0.5 10*3/uL (ref 0.1–1.0)
Monocytes Relative: 7.4 % (ref 3.0–12.0)
Neutro Abs: 4 10*3/uL (ref 1.4–7.7)
Neutrophils Relative %: 56.3 % (ref 43.0–77.0)
Platelets: 237 10*3/uL (ref 150.0–400.0)
RBC: 5.46 Mil/uL (ref 4.22–5.81)
RDW: 13.4 % (ref 11.5–15.5)
WBC: 7.1 10*3/uL (ref 4.0–10.5)

## 2018-04-18 LAB — BASIC METABOLIC PANEL
BUN: 18 mg/dL (ref 6–23)
CO2: 27 meq/L (ref 19–32)
Calcium: 9.8 mg/dL (ref 8.4–10.5)
Chloride: 104 mEq/L (ref 96–112)
Creatinine, Ser: 1.34 mg/dL (ref 0.40–1.50)
GFR: 52.61 mL/min — ABNORMAL LOW (ref >60.00)
Glucose, Bld: 114 mg/dL — ABNORMAL HIGH (ref 70–99)
Potassium: 4.6 mEq/L (ref 3.5–5.1)
Sodium: 140 mEq/L (ref 135–145)

## 2018-04-18 LAB — LIPID PANEL
Cholesterol: 105 mg/dL (ref 0–200)
HDL: 35.3 mg/dL — ABNORMAL LOW (ref >39.00)
LDL Cholesterol: 53 mg/dL (ref 0–99)
NonHDL: 69.77
Total CHOL/HDL Ratio: 3
Triglycerides: 83 mg/dL (ref 0.0–149.0)
VLDL: 16.6 mg/dL (ref 0.0–40.0)

## 2018-04-18 LAB — HEMOGLOBIN A1C: Hgb A1c MFr Bld: 5.7 % (ref 4.6–6.5)

## 2018-04-18 LAB — HEPATIC FUNCTION PANEL
ALT: 25 U/L (ref 0–53)
AST: 18 U/L (ref 0–37)
Albumin: 4.3 g/dL (ref 3.5–5.2)
Alkaline Phosphatase: 87 U/L (ref 39–117)
Bilirubin, Direct: 0.2 mg/dL (ref 0.0–0.3)
Total Bilirubin: 0.6 mg/dL (ref 0.2–1.2)
Total Protein: 6.5 g/dL (ref 6.0–8.3)

## 2018-04-18 LAB — PSA: PSA: 0.77 ng/mL (ref 0.10–4.00)

## 2018-04-21 ENCOUNTER — Other Ambulatory Visit (INDEPENDENT_AMBULATORY_CARE_PROVIDER_SITE_OTHER): Payer: BLUE CROSS/BLUE SHIELD

## 2018-04-21 ENCOUNTER — Encounter: Payer: Self-pay | Admitting: Internal Medicine

## 2018-04-21 ENCOUNTER — Telehealth: Payer: Self-pay | Admitting: Internal Medicine

## 2018-04-21 ENCOUNTER — Ambulatory Visit: Payer: BLUE CROSS/BLUE SHIELD | Admitting: Internal Medicine

## 2018-04-21 ENCOUNTER — Telehealth: Payer: Self-pay

## 2018-04-21 VITALS — BP 120/78 | HR 56 | Temp 98.5°F | Ht 76.0 in | Wt 266.0 lb

## 2018-04-21 DIAGNOSIS — R399 Unspecified symptoms and signs involving the genitourinary system: Secondary | ICD-10-CM | POA: Diagnosis not present

## 2018-04-21 DIAGNOSIS — R739 Hyperglycemia, unspecified: Secondary | ICD-10-CM

## 2018-04-21 DIAGNOSIS — I10 Essential (primary) hypertension: Secondary | ICD-10-CM

## 2018-04-21 DIAGNOSIS — R300 Dysuria: Secondary | ICD-10-CM

## 2018-04-21 DIAGNOSIS — R3129 Other microscopic hematuria: Secondary | ICD-10-CM

## 2018-04-21 LAB — URINALYSIS, ROUTINE W REFLEX MICROSCOPIC
Bilirubin Urine: NEGATIVE
Ketones, ur: NEGATIVE
Leukocytes, UA: NEGATIVE
NITRITE: NEGATIVE
Specific Gravity, Urine: 1.025 (ref 1.000–1.030)
TOTAL PROTEIN, URINE-UPE24: NEGATIVE
Urine Glucose: NEGATIVE
Urobilinogen, UA: 0.2 (ref 0.0–1.0)
pH: 5.5 (ref 5.0–8.0)

## 2018-04-21 NOTE — Telephone Encounter (Signed)
-----   Message from [name], MD sent at 04/21/2018  1:23 PM EST ----- Left message on MyChart, pt to cont same tx except -   UA with new small blood - ? Kidney stone, for CT abd/pelvis as d/w pt at the visit  Jalise Zawistowski to please inform pt, I

## 2018-04-21 NOTE — Patient Instructions (Signed)
Please continue all other medications as before, and refills have been done if requested.  Please have the pharmacy call with any other refills you may need.  Please continue your efforts at being more active, low cholesterol diet, and weight control.  You are otherwise up to date with prevention measures today.  Please keep your appointments with your specialists as you may have planned  Your uriine testing and culture should return in the next few days  You may want to check your pharmacy on Sunday to see if an antibiotic has been sent  See you on Monday.

## 2018-04-21 NOTE — Progress Notes (Signed)
Subjective:    Patient ID: Antonio Reyes, male    DOB: 1946-02-12, 73 y.o.   MRN: [id number]  HPI  Here with dysuria and mild frequency x 2 days starting the day after was just here for lab testing prior to CPX coming up in a few days. Denies urinary symptoms such as urgency, flank pain, hematuria or n/v, fever, chills.  Did have a tingly feeling to umbilical area last night.  O/w Pt denies chest pain, increased sob or doe, wheezing, orthopnea, PND, increased LE swelling, palpitations, dizziness or syncope.  Pt denies new neurological symptoms such as new headache, or facial or extremity weakness or numbness   Pt denies polydipsia, polyuria.  No known hx of renal stone.   Past Medical History:  Diagnosis Date  . Anxiety   . Benign neoplasm of stomach   . BPH (benign prostatic hypertrophy)   . Cancer ([place]) 2000   skin cancer   . Diverticulitis   . ED (erectile dysfunction)   . GERD (gastroesophageal reflux disease)   . History of herpes genitalis   . HLD (hyperlipidemia)   . HTN (hypertension)   . Low back pain   . Migraine   . Rotator cuff tear   . Sleep apnea    CPAP  . Tubular adenoma of colon 05/2008   Past Surgical History:  Procedure Laterality Date  . COLONOSCOPY    . HERNIA REPAIR    . KNEE SURGERY     mensicus  . POLYPECTOMY      reports that he has been smoking cigars. He has never used smokeless tobacco. He reports current alcohol use of about 1.0 standard drinks of alcohol per week. He reports that he does not use drugs. family history includes Colon cancer (age of onset: 6) in his mother. Allergies  Allergen Reactions  . [place] Other (See Comments)  ' Current Outpatient Medications on File Prior to Visit  Medication Sig Dispense Refill  . aspirin 81 MG tablet Take 81 mg by mouth daily.      Marland Kitchen atorvastatin (LIPITOR) 40 MG tablet TAKE 1 TABLET BY MOUTH DAILY GENERIC EQUIVALENT FOR LIPITOR 90 tablet 3  . benzonatate (TESSALON PERLES) 100 MG capsule 1-2 tab  by mouth every 8 hrs as needed 60 capsule 1  . fluticasone (FLONASE) 50 MCG/ACT nasal spray USE 2 SPRAYS IN EACH NOSTRIL DAILY GENERIC EQUIVALENT FOR FLONASE 48 g 1  . gabapentin (NEURONTIN) 100 MG capsule Take 2 capsules (200 mg total) by mouth at bedtime. 60 capsule 3  . metoprolol succinate (TOPROL-XL) 50 MG 24 hr tablet TAKE 1 TABLET BY MOUTH DAILY WITH OR IMMEDIATELY FOLLOWING A MEAL 90 tablet 3  . montelukast (SINGULAIR) 10 MG tablet TAKE 1 TABLET BY MOUTH DAILY GENERIC EQUIVALENT FOR SINGULAIR 90 tablet 0   No current facility-administered medications on file prior to visit.    Review of Systems  Constitutional: Negative for other unusual diaphoresis or sweats HENT: Negative for ear discharge or swelling Eyes: Negative for other worsening visual disturbances Respiratory: Negative for stridor or other swelling  Gastrointestinal: Negative for worsening distension or other blood Genitourinary: Negative for retention or other urinary change Musculoskeletal: Negative for other MSK pain or swelling Skin: Negative for color change or other new lesions Neurological: Negative for worsening tremors and other numbness  Psychiatric/Behavioral: Negative for worsening agitation or other fatigue All other system neg per pt    Objective:   Physical Exam BP 120/78   Pulse Marland Kitchen)  56   Temp 98.5 F (36.9 C) (Oral)   Ht 6\' 4"  (1.93 m)   Wt 266 lb (120.7 kg)   SpO2 94%   BMI 32.38 kg/m  VS noted, non toxic Constitutional: Pt appears in NAD HENT: Head: NCAT.  Right Ear: External ear normal.  Left Ear: External ear normal.  Eyes: . Pupils are equal, round, and reactive to light. Conjunctivae and EOM are normal Nose: without d/c or deformity Neck: Neck supple. Gross normal ROM Cardiovascular: Normal rate and regular rhythm.   Pulmonary/Chest: Effort normal and breath sounds without rales or wheezing.  Abd:  Soft, NT, ND, + BS, no organomegaly Neurological: Pt is alert. At baseline orientation,  motor grossly intact Skin: Skin is warm. No rashes, other new lesions, no LE edema Psychiatric: Pt behavior is normal without agitation  No other exam findings Lab Results  Component Value Date   WBC 7.1 04/18/2018   HGB 16.9 04/18/2018   HCT 49.7 04/18/2018   PLT 237.0 04/18/2018   GLUCOSE 114 (H) 04/18/2018   CHOL 105 04/18/2018   TRIG 83.0 04/18/2018   HDL 35.30 (L) 04/18/2018   LDLDIRECT 148.7 04/12/2008   LDLCALC 53 04/18/2018   ALT 25 04/18/2018   AST 18 04/18/2018   NA 140 04/18/2018   K 4.6 04/18/2018   CL 104 04/18/2018   CREATININE 1.34 04/18/2018   BUN 18 04/18/2018   CO2 27 04/18/2018   TSH 2.78 04/18/2018   PSA 0.77 04/18/2018   HGBA1C 5.7 04/18/2018      Assessment & Plan:

## 2018-04-21 NOTE — Telephone Encounter (Signed)
Called pt, LVM.   CRM created.  

## 2018-04-21 NOTE — Telephone Encounter (Signed)
Copied from [place] [phone]. Topic: Quick Communication - Lab Results (Clinic Use ONLY) >> Apr 21, 2018  2:08 PM Juliet Rude, CMA wrote:  Discuss LAB results. Ok for [facility] to inform. >> Apr 21, 2018  3:01 PM Bea Graff, NT wrote: Pt calling back to receive lab results.

## 2018-04-21 NOTE — Progress Notes (Signed)
Ok to let pt know, a small amount of blood was found with the urine testing today  The culture is pending, but I do not suspect infection, as this would be more likely a kidney stone related issue  I will order the CT abd/pelvix (no contrast) as we discussed might happen at his visit;  It does not have to be done today, but sooner than later is better

## 2018-04-21 NOTE — Assessment & Plan Note (Addendum)
Exam benign, most recent UA early this wk pre CPX was normal, but repeated today out of abundance of caution with culture; will have tx pending results, and/or consider antibx for prostatitis; pt has appt for f/u Monday Feb 3; but pt to consider going to ED over the weekend if any worsening pain, fever, frequency and /or hematuria to r/o renal stone

## 2018-04-21 NOTE — Telephone Encounter (Signed)
Patient notified of result- he will await call to schedule CT scan

## 2018-04-21 NOTE — Assessment & Plan Note (Signed)
stable overall by history and exam, recent data reviewed with pt, and pt to continue medical treatment as before,  to f/u any worsening symptoms or concerns lbe  

## 2018-04-21 NOTE — Assessment & Plan Note (Signed)
stable overall by history and exam, recent data reviewed with pt, and pt to continue medical treatment as before,  to f/u any worsening symptoms or concerns  

## 2018-04-22 LAB — URINE CULTURE
MICRO NUMBER:: 134136
Result:: NO GROWTH
SPECIMEN QUALITY:: ADEQUATE

## 2018-04-24 ENCOUNTER — Ambulatory Visit (INDEPENDENT_AMBULATORY_CARE_PROVIDER_SITE_OTHER): Payer: BLUE CROSS/BLUE SHIELD | Admitting: Internal Medicine

## 2018-04-24 ENCOUNTER — Encounter: Payer: Self-pay | Admitting: Internal Medicine

## 2018-04-24 VITALS — BP 116/72 | HR 66 | Temp 98.0°F | Ht 76.0 in | Wt 269.0 lb

## 2018-04-24 DIAGNOSIS — Z23 Encounter for immunization: Secondary | ICD-10-CM

## 2018-04-24 DIAGNOSIS — R739 Hyperglycemia, unspecified: Secondary | ICD-10-CM

## 2018-04-24 DIAGNOSIS — N183 Chronic kidney disease, stage 3 unspecified: Secondary | ICD-10-CM | POA: Insufficient documentation

## 2018-04-24 DIAGNOSIS — Z860100 Personal history of colon polyps, unspecified: Secondary | ICD-10-CM

## 2018-04-24 DIAGNOSIS — R300 Dysuria: Secondary | ICD-10-CM

## 2018-04-24 DIAGNOSIS — Z0001 Encounter for general adult medical examination with abnormal findings: Secondary | ICD-10-CM | POA: Diagnosis not present

## 2018-04-24 DIAGNOSIS — R3129 Other microscopic hematuria: Secondary | ICD-10-CM

## 2018-04-24 DIAGNOSIS — E785 Hyperlipidemia, unspecified: Secondary | ICD-10-CM

## 2018-04-24 NOTE — Assessment & Plan Note (Signed)
Due for f/u colnoscopy with Dr Fuller Plan

## 2018-04-24 NOTE — Assessment & Plan Note (Addendum)
Mild, urine cx neg, exam benign, ok to follow  In addition to the time spent performing CPE, I spent an additional 15 minutes face to face,in which greater than 50% of this time was spent in counseling and coordination of care for patient's illness as documented, including the differential dx, treatment, further evaluation and other management of dysuria, microhematuria, hx of colon polyps, HLD, CKD, hyperglycemia

## 2018-04-24 NOTE — Assessment & Plan Note (Signed)
For CT abd/pelvic renal stone protocol, to see [facility] today; also refer urology for cysto

## 2018-04-24 NOTE — Assessment & Plan Note (Signed)
stable overall by history and exam, recent data reviewed with pt, and pt to continue medical treatment as before,  to f/u any worsening symptoms or concerns  

## 2018-04-24 NOTE — Patient Instructions (Addendum)
You had the Tdap tetanus shot today  You will be contacted regarding the referral for: colonoscopy   You will be contacted regarding the referral for: CT scan (to see [facility] now)  You will be contacted regarding the referral for: Urology  You will be contacted regarding the referral for: Cardiac CT score (for calcium)  Please continue all other medications as before, and refills have been done if requested.  Please have the pharmacy call with any other refills you may need.  Please continue your efforts at being more active, low cholesterol diet, and weight control.  You are otherwise up to date with prevention measures today.  Please keep your appointments with your specialists as you may have planned  Please return in 6 months, or sooner if needed, with Lab testing done 3-5 days before

## 2018-04-24 NOTE — Progress Notes (Signed)
Subjective:    Patient ID: Antonio Reyes, male    DOB: 04/30/1945, 73 y.o.   MRN: [id number]  HPI  Here for wellness and f/u;  Overall doing ok;  Pt denies Chest pain, worsening SOB, DOE, wheezing, orthopnea, PND, worsening LE edema, palpitations, dizziness or syncope.  Pt denies neurological change such as new headache, facial or extremity weakness.  Pt denies polydipsia, polyuria, or low sugar symptoms. Pt states overall good compliance with treatment and medications, good tolerability, and has been trying to follow appropriate diet.  Pt denies worsening depressive symptoms, suicidal ideation or panic. No fever, night sweats, wt loss, loss of appetite, or other constitutional symptoms.  Pt states good ability with ADL's, has low fall risk, home safety reviewed and adequate, no other significant changes in hearing or vision, and only occasionally active with exercise.  Denies urinary symptoms such as dysuria, frequency, urgency, flank pain, hematuria or n/v, fever, chills, except for odd slight feeling at end urination, and did have some right lower side pain 2 days ago for a couple hours only.  Due for f/u colonoscopy Pt is interested in Cardiac CT scoring Past Medical History:  Diagnosis Date  . Anxiety   . Benign neoplasm of stomach   . BPH (benign prostatic hypertrophy)   . Cancer ([place]) 2000   skin cancer   . Diverticulitis   . ED (erectile dysfunction)   . GERD (gastroesophageal reflux disease)   . History of herpes genitalis   . HLD (hyperlipidemia)   . HTN (hypertension)   . Low back pain   . Migraine   . Rotator cuff tear   . Sleep apnea    CPAP  . Tubular adenoma of colon 05/2008   Past Surgical History:  Procedure Laterality Date  . COLONOSCOPY    . HERNIA REPAIR    . KNEE SURGERY     mensicus  . POLYPECTOMY      reports that he has been smoking cigars. He has never used smokeless tobacco. He reports current alcohol use of about 1.0 standard drinks of alcohol per week.  He reports that he does not use drugs. family history includes Colon cancer (age of onset: 58) in his mother. Allergies  Allergen Reactions  . [employer] Other (See Comments)   Current Outpatient Medications on File Prior to Visit  Medication Sig Dispense Refill  . aspirin 81 MG tablet Take 81 mg by mouth daily.      Marland Kitchen atorvastatin (LIPITOR) 40 MG tablet TAKE 1 TABLET BY MOUTH DAILY GENERIC EQUIVALENT FOR LIPITOR 90 tablet 3  . benzonatate (TESSALON PERLES) 100 MG capsule 1-2 tab by mouth every 8 hrs as needed 60 capsule 1  . fluticasone (FLONASE) 50 MCG/ACT nasal spray USE 2 SPRAYS IN EACH NOSTRIL DAILY GENERIC EQUIVALENT FOR FLONASE 48 g 1  . gabapentin (NEURONTIN) 100 MG capsule Take 2 capsules (200 mg total) by mouth at bedtime. 60 capsule 3  . metoprolol succinate (TOPROL-XL) 50 MG 24 hr tablet TAKE 1 TABLET BY MOUTH DAILY WITH OR IMMEDIATELY FOLLOWING A MEAL 90 tablet 3  . montelukast (SINGULAIR) 10 MG tablet TAKE 1 TABLET BY MOUTH DAILY GENERIC EQUIVALENT FOR SINGULAIR 90 tablet 0   No current facility-administered medications on file prior to visit.    Review of Systems Constitutional: Negative for other unusual diaphoresis, sweats, appetite or weight changes HENT: Negative for other worsening hearing loss, ear pain, facial swelling, mouth sores or neck stiffness.   Eyes: Negative for  other worsening pain, redness or other visual disturbance.  Respiratory: Negative for other stridor or swelling Cardiovascular: Negative for other palpitations or other chest pain  Gastrointestinal: Negative for worsening diarrhea or loose stools, blood in stool, distention or other pain Genitourinary: Negative for hematuria, flank pain or other change in urine volume.  Musculoskeletal: Negative for myalgias or other joint swelling.  Skin: Negative for other color change, or other wound or worsening drainage.  Neurological: Negative for other syncope or numbness. Hematological: Negative for other  adenopathy or swelling Psychiatric/Behavioral: Negative for hallucinations, other worsening agitation, SI, self-injury, or new decreased concentration All other system neg per pt    Objective:   Physical Exam BP 116/72   Pulse 66   Temp 98 F (36.7 C) (Oral)   Ht 6\' 4"  (1.93 m)   Wt 269 lb (122 kg)   SpO2 96%   BMI 32.74 kg/m  VS noted,  Constitutional: Pt is oriented to person, place, and time. Appears well-developed and well-nourished, in no significant distress and comfortable Head: Normocephalic and atraumatic  Eyes: Conjunctivae and EOM are normal. Pupils are equal, round, and reactive to light Right Ear: External ear normal without discharge Left Ear: External ear normal without discharge Nose: Nose without discharge or deformity Mouth/Throat: Oropharynx is without other ulcerations and moist  Neck: Normal range of motion. Neck supple. No JVD present. No tracheal deviation present or significant neck LA or mass Cardiovascular: Normal rate, regular rhythm, normal heart sounds and intact distal pulses.   Pulmonary/Chest: WOB normal and breath sounds without rales or wheezing  Abdominal: Soft. Bowel sounds are normal. NT. No HSM  Musculoskeletal: Normal range of motion. Exhibits no edema Lymphadenopathy: Has no other cervical adenopathy.  Neurological: Pt is alert and oriented to person, place, and time. Pt has normal reflexes. No cranial nerve deficit. Motor grossly intact, Gait intact Skin: Skin is warm and dry. No rash noted or new ulcerations Psychiatric:  Has normal mood and affect. Behavior is normal without agitation No other exam findings Lab Results  Component Value Date   WBC 7.1 04/18/2018   HGB 16.9 04/18/2018   HCT 49.7 04/18/2018   PLT 237.0 04/18/2018   GLUCOSE 114 (H) 04/18/2018   CHOL 105 04/18/2018   TRIG 83.0 04/18/2018   HDL 35.30 (L) 04/18/2018   LDLDIRECT 148.7 04/12/2008   LDLCALC 53 04/18/2018   ALT 25 04/18/2018   AST 18 04/18/2018   NA 140  04/18/2018   K 4.6 04/18/2018   CL 104 04/18/2018   CREATININE 1.34 04/18/2018   BUN 18 04/18/2018   CO2 27 04/18/2018   TSH 2.78 04/18/2018   PSA 0.77 04/18/2018   HGBA1C 5.7 04/18/2018   Urine cx - neg Contains abnormal data Urinalysis, Routine w reflex microscopic  Order: [id number]  Status:  Final result Visible to patient:  Yes (MyChart) Next appt:  04/25/2018 at 10:00 AM in Radiology ([facility]CT-CT 1) Dx:  UTI symptoms   Ref Range & Units 3d ago 6d ago 47yr ago 105yr ago 49yr ago  Color, Urine Yellow;Lt. Yellow;Straw;Dark Yellow;Amber;Green;Red;Brown YELLOW  YELLOW  YELLOW R YELLOW R YELLOW R  APPearance Clear;Turbid;Slightly Cloudy;Cloudy CLEAR  CLEAR  CLEAR R CLEAR R CLEAR R  Specific Gravity, Urine 1.000 - 1.030 1.025  1.020  1.020  1.020  1.015   pH 5.0 - 8.0 5.5  5.5  6.0  6.0  6.5   Total Protein, Urine Negative NEGATIVE  NEGATIVE  NEGATIVE  NEGATIVE  NEGATIVE   Urine  Glucose Negative NEGATIVE  NEGATIVE  NEGATIVE  NEGATIVE  NEGATIVE   Ketones, ur Negative NEGATIVE  NEGATIVE  NEGATIVE  NEGATIVE  NEGATIVE   Bilirubin Urine Negative NEGATIVE  NEGATIVE  NEGATIVE  NEGATIVE  NEGATIVE   Hgb urine dipstick Negative MODERATEAbnormal   NEGATIVE  NEGATIVE  NEGATIVE  NEGATIVE   Urobilinogen, UA 0.0 - 1.0 0.2  0.2  0.2  0.2  0.2   Leukocytes, UA Negative NEGATIVE  NEGATIVE  NEGATIVE  NEGATIVE  NEGATIVE   Nitrite Negative NEGATIVE  NEGATIVE  NEGATIVE  NEGATIVE  NEGATIVE   WBC, UA 0-2/hpf 0-2/hpf  none seen  none seen  none seen  none seen   RBC / HPF 0-2/hpf 3-6/hpfAbnormal   none seen  none seen  none seen  none seen            Assessment & Plan:

## 2018-04-24 NOTE — Assessment & Plan Note (Signed)

## 2018-04-25 ENCOUNTER — Ambulatory Visit (INDEPENDENT_AMBULATORY_CARE_PROVIDER_SITE_OTHER)
Admission: RE | Admit: 2018-04-25 | Discharge: 2018-04-25 | Disposition: A | Discharge status: Home / Self Care | Payer: BLUE CROSS/BLUE SHIELD | Source: Ambulatory Visit | Attending: Internal Medicine | Admitting: Internal Medicine

## 2018-04-25 ENCOUNTER — Telehealth: Payer: Self-pay

## 2018-04-25 DIAGNOSIS — R3129 Other microscopic hematuria: Secondary | ICD-10-CM | POA: Diagnosis not present

## 2018-04-25 DIAGNOSIS — K4090 Unilateral inguinal hernia, without obstruction or gangrene, not specified as recurrent: Secondary | ICD-10-CM

## 2018-04-25 DIAGNOSIS — I700 Atherosclerosis of aorta: Secondary | ICD-10-CM

## 2018-04-25 DIAGNOSIS — Z87442 Personal history of urinary calculi: Secondary | ICD-10-CM

## 2018-04-25 HISTORY — DX: Atherosclerosis of aorta: I70.0

## 2018-04-25 HISTORY — DX: Personal history of urinary calculi: Z87.442

## 2018-04-25 HISTORY — DX: Unilateral inguinal hernia, without obstruction or gangrene, not specified as recurrent: K40.90

## 2018-04-25 NOTE — Telephone Encounter (Signed)
Ok, I will pass this on to [facility]

## 2018-04-25 NOTE — Telephone Encounter (Signed)
Pt has been informed and expressed understanding. He stated that he is having pain and would like for it to be doing quickly for him to be evaluated.

## 2018-04-25 NOTE — Telephone Encounter (Signed)
Referral sent to [facility] as urgent

## 2018-04-25 NOTE — Telephone Encounter (Signed)
-----   Message from [name], MD sent at 04/25/2018 10:35 AM EST ----- Left message on MyChart, pt to cont same tx except  The test results show that your current treatment is OK, except there is a 4 mm stone on the left that does show some mild obstruction.  As you are not having pain, we don't have to be urgent about the urology referral, but it is still a good idea to see asap.    There is always the possibility that a stone of this size may pass on its own as well.  Todd Argabright to please inform pt, and encourage to f/u with urology

## 2018-04-26 ENCOUNTER — Encounter: Payer: Self-pay | Admitting: Gastroenterology

## 2018-04-30 ENCOUNTER — Other Ambulatory Visit: Payer: Self-pay

## 2018-04-30 ENCOUNTER — Emergency Department (HOSPITAL_COMMUNITY)
Admission: EM | Admit: 2018-04-30 | Discharge: 2018-05-01 | Disposition: A | Discharge status: Home / Self Care | Payer: BLUE CROSS/BLUE SHIELD | Attending: Emergency Medicine | Admitting: Emergency Medicine

## 2018-04-30 ENCOUNTER — Encounter (HOSPITAL_COMMUNITY): Payer: Self-pay

## 2018-04-30 DIAGNOSIS — N210 Calculus in bladder: Secondary | ICD-10-CM | POA: Insufficient documentation

## 2018-04-30 DIAGNOSIS — Z79899 Other long term (current) drug therapy: Secondary | ICD-10-CM | POA: Insufficient documentation

## 2018-04-30 DIAGNOSIS — N201 Calculus of ureter: Secondary | ICD-10-CM | POA: Diagnosis not present

## 2018-04-30 DIAGNOSIS — N183 Chronic kidney disease, stage 3 (moderate): Secondary | ICD-10-CM | POA: Diagnosis not present

## 2018-04-30 DIAGNOSIS — Z7982 Long term (current) use of aspirin: Secondary | ICD-10-CM | POA: Insufficient documentation

## 2018-04-30 DIAGNOSIS — F17290 Nicotine dependence, other tobacco product, uncomplicated: Secondary | ICD-10-CM | POA: Diagnosis not present

## 2018-04-30 DIAGNOSIS — I129 Hypertensive chronic kidney disease with stage 1 through stage 4 chronic kidney disease, or unspecified chronic kidney disease: Secondary | ICD-10-CM | POA: Diagnosis not present

## 2018-04-30 DIAGNOSIS — R1032 Left lower quadrant pain: Secondary | ICD-10-CM | POA: Diagnosis present

## 2018-04-30 LAB — URINALYSIS, ROUTINE W REFLEX MICROSCOPIC
Bacteria, UA: NONE SEEN
Bilirubin Urine: NEGATIVE
Glucose, UA: NEGATIVE mg/dL
Ketones, ur: NEGATIVE mg/dL
Leukocytes, UA: NEGATIVE
Nitrite: NEGATIVE
Protein, ur: NEGATIVE mg/dL
RBC / HPF: >50 RBC/hpf — ABNORMAL HIGH (ref 0–5)
Specific Gravity, Urine: 1.018 (ref 1.005–1.030)
pH: 5 (ref 5.0–8.0)

## 2018-04-30 MED ORDER — OXYCODONE-ACETAMINOPHEN 5-325 MG PO TABS: 1.0000 | ORAL_TABLET | ORAL | 0 refills | Status: DC | PRN

## 2018-04-30 MED ORDER — TAMSULOSIN HCL 0.4 MG PO CAPS: 0.4000 mg | ORAL_CAPSULE | Freq: Every day | ORAL | 0 refills | Status: DC

## 2018-04-30 MED ORDER — ONDANSETRON HCL 4 MG PO TABS: 4.0000 mg | ORAL_TABLET | Freq: Four times a day (QID) | ORAL | 0 refills | Status: DC | PRN

## 2018-04-30 NOTE — ED Triage Notes (Signed)
Pt state he has a 4-62mm left sided kidney stone. Pt states he had a positive CT wednesday. Pt states that that he is starting to see blood in his urine.

## 2018-04-30 NOTE — ED Provider Notes (Signed)
[facility] DEPT Provider Note   CSN: [id number] Arrival date & time: 04/30/18  1652     History   Chief Complaint Chief Complaint  Patient presents with  . Flank Pain    HPI Antonio Reyes is a 73 y.o. male.  The history is provided by the patient.  Flank Pain   He has history of hypertension, hyperlipidemia, chronic kidney disease and was recently diagnosed with a left-sided kidney stone.  He started having some left flank pain about 9 days ago and saw his PCP last week and had CT scan done 5 days ago showing a left distal left ureteral calculus.  Pain is now moved from the left flank to the left side of the scrotum.  He rates pain at 6/10.  He has not been taking anything for pain.  There was some mild, transient nausea.  He denies vomiting and denies fever or chills.  He started noticing blood in his urine earlier today and has noted some urinary hesitancy today.  He is scheduled to see a urologist in 3 days.  Past Medical History:  Diagnosis Date  . Anxiety   . Benign neoplasm of stomach   . BPH (benign prostatic hypertrophy)   . Cancer ([place]) 2000   skin cancer   . Diverticulitis   . ED (erectile dysfunction)   . GERD (gastroesophageal reflux disease)   . History of herpes genitalis   . HLD (hyperlipidemia)   . HTN (hypertension)   . Low back pain   . Migraine   . Rotator cuff tear   . Sleep apnea    CPAP  . Tubular adenoma of colon 05/2008    Patient Active Problem List   Diagnosis Date Noted  . Microhematuria 04/24/2018  . CKD (chronic kidney disease) stage 3, GFR 30-59 ml/min (HCC) 04/24/2018  . Dysuria 04/21/2018  . Allergic rhinitis 09/20/2017  . Nonallopathic lesion of sacral region 08/04/2017  . Nonallopathic lesion of lumbosacral region 08/04/2017  . Nonallopathic lesion of thoracic region 08/04/2017  . Bilateral foot pain 04/22/2017  . Left leg pain 04/22/2017  . Bilateral groin pain 04/22/2017  . Metatarsalgia  11/11/2016  . Hallux rigidus 06/11/2016  . Contusion of left knee 06/11/2016  . Hyperglycemia 04/21/2016  . Chest pain 06/05/2015  . Polycythemia, secondary 02/09/2013  . Dizziness 06/08/2011  . Chronic sinusitis 06/08/2011  . Encounter for well adult exam with abnormal findings 08/18/2010  . Ankle pain 08/18/2010  . Morbid obesity ([place]) 04/20/2010  . ABRASION, [place] 04/20/2010  . CHEST PAIN 07/22/2009  . GENITAL HERPES 04/12/2008  . ANXIETY 04/12/2008  . MIGRAINE, COMMON 04/12/2008  . BENIGN PROSTATIC HYPERTROPHY 04/12/2008  . COLONIC POLYPS, HX OF 04/12/2008  . LOW BACK PAIN 11/26/2006  . HYPERCHOLESTEROLEMIA 11/17/2006  . ERECTILE DYSFUNCTION 11/17/2006  . Essential hypertension 11/17/2006  . GERD 11/17/2006  . OSA (obstructive sleep apnea) 11/17/2006  . DIVERTICULITIS, HX OF 11/17/2006    Past Surgical History:  Procedure Laterality Date  . COLONOSCOPY    . HERNIA REPAIR    . KNEE SURGERY     mensicus  . POLYPECTOMY          Home Medications    Prior to Admission medications   Medication Sig Start Date End Date Taking? Authorizing Provider  aspirin 81 MG tablet Take 81 mg by mouth daily.      [provider]  atorvastatin (LIPITOR) 40 MG tablet TAKE 1 TABLET BY MOUTH DAILY GENERIC EQUIVALENT FOR  LIPITOR 06/13/17   [name], MD  benzonatate (TESSALON PERLES) 100 MG capsule 1-2 tab by mouth every 8 hrs as needed 09/20/17   [name], MD  fluticasone [facility]) 50 MCG/ACT nasal spray USE 2 SPRAYS IN [facility] NOSTRIL DAILY GENERIC EQUIVALENT FOR FLONASE 03/20/18   [name], MD  gabapentin (NEURONTIN) 100 MG capsule Take 2 capsules (200 mg total) by mouth at bedtime. 07/12/17   Lyndal Pulley, DO  metoprolol succinate (TOPROL-XL) 50 MG 24 hr tablet TAKE 1 TABLET BY MOUTH DAILY WITH OR IMMEDIATELY FOLLOWING A MEAL 06/23/17   [name], MD  montelukast (SINGULAIR) 10 MG tablet TAKE 1 TABLET BY MOUTH DAILY GENERIC EQUIVALENT FOR SINGULAIR 03/20/18   [name], MD    Family History Family History  Problem Relation Age of Onset  . Colon cancer Mother 15    Social History Social History   Tobacco Use  . Smoking status: Current Some Day Smoker    Types: Cigars  . Smokeless tobacco: Never Used  . Tobacco comment: occasionally  Substance Use Topics  . Alcohol use: Yes    Alcohol/week: 1.0 standard drinks    Types: 1 Standard drinks or equivalent per week    Comment: social 2 beers a month  . Drug use: No     Allergies   Olive tree   Review of Systems Review of Systems  Genitourinary: Positive for flank pain.  All other systems reviewed and are negative.    Physical Exam Updated Vital Signs BP (!) 143/93 (BP Location: Left Arm)   Pulse 72   Temp 97.9 F (36.6 C) (Oral)   Resp 20   Ht 6\' 4"  (1.93 m)   Wt 121.4 kg   SpO2 96%   BMI 32.57 kg/m   Physical Exam Vitals signs and nursing note reviewed.    73 year old male, resting comfortably and in no acute distress. Vital signs are significant for elevated blood pressure. Oxygen saturation is 96%, which is normal. Head is normocephalic and atraumatic. PERRLA, EOMI. Oropharynx is clear. Neck is nontender and supple without adenopathy or JVD. Back is nontender and there is no CVA tenderness. Lungs are clear without rales, wheezes, or rhonchi. Chest is nontender. Heart has regular rate and rhythm without murmur. Abdomen is soft, flat, nontender without masses or hepatosplenomegaly and peristalsis is normoactive. Extremities have no cyanosis or edema, full range of motion is present. Skin is warm and dry without rash. Neurologic: Mental status is normal, cranial nerves are intact, there are no motor or sensory deficits.  ED Treatments / Results  Labs (all labs ordered are listed, but only abnormal results are displayed) Labs Reviewed  URINALYSIS, ROUTINE W REFLEX MICROSCOPIC - Abnormal; Notable for the following components:      Result Value   APPearance HAZY  (*)    Hgb urine dipstick LARGE (*)    RBC / HPF >50 (*)    All other components within normal limits   Procedures Procedures   Medications Ordered in ED Medications - No data to display   Initial Impression / Assessment and Plan / ED Course  I have reviewed the triage vital signs and the nursing notes.  Pertinent lab results that were available during my care of the patient were reviewed by me and considered in my medical decision making (see chart for details).  Left ureteral calculus.  Old records are reviewed confirming recent CT scan showing 4.3 mm distal left ureteral  calculus.  Also, there is a 10 mm bladder calculus.  I reviewed the images and there is moderate hydronephrosis.  He does not show any signs of pink toxic and urinalysis shows only presence of numerous RBCs.  No indication for repeat imaging today.  I discussed treatment of kidney stones with patient.  He is given prescriptions for oxycodone-acetaminophen, ondansetron, tamsulosin and is advised to keep his appointment with urologist.  I am actually more concerned about what appropriate management of the bladder calculus is rather than the distal ureteral calculus.  He is advised to return to the ED if he develops a fever or if pain is not being adequately controlled at home.  Final Clinical Impressions(s) / ED Diagnoses   Final diagnoses:  Ureterolithiasis  Bladder calculus    ED Discharge Orders         Ordered    oxyCODONE-acetaminophen (PERCOCET) 5-325 MG tablet  Every 4 hours PRN     04/30/18 2356    ondansetron (ZOFRAN) 4 MG tablet  Every 6 hours PRN     04/30/18 2356    tamsulosin (FLOMAX) 0.4 MG CAPS capsule  Daily     04/30/18 9169           Delora Fuel, MD 45/03/88 0002

## 2018-04-30 NOTE — Discharge Instructions (Addendum)
Take ibuprofen as needed for less severe pain.  Return to the Emergency Department if you develop a fever, or if pain is not being adequately controlled at home.

## 2018-05-03 ENCOUNTER — Other Ambulatory Visit: Payer: Self-pay | Admitting: Urology

## 2018-05-08 ENCOUNTER — Encounter: Payer: Self-pay | Admitting: Internal Medicine

## 2018-05-09 ENCOUNTER — Encounter: Payer: Self-pay | Admitting: Internal Medicine

## 2018-05-09 ENCOUNTER — Ambulatory Visit: Payer: BLUE CROSS/BLUE SHIELD | Admitting: Internal Medicine

## 2018-05-09 VITALS — BP 114/76 | HR 60 | Temp 97.6°F | Ht 76.0 in | Wt 267.0 lb

## 2018-05-09 DIAGNOSIS — R3129 Other microscopic hematuria: Secondary | ICD-10-CM

## 2018-05-09 DIAGNOSIS — M542 Cervicalgia: Secondary | ICD-10-CM | POA: Diagnosis not present

## 2018-05-09 DIAGNOSIS — R739 Hyperglycemia, unspecified: Secondary | ICD-10-CM | POA: Diagnosis not present

## 2018-05-09 DIAGNOSIS — N183 Chronic kidney disease, stage 3 unspecified: Secondary | ICD-10-CM

## 2018-05-09 MED ORDER — TIZANIDINE HCL 2 MG PO TABS: 2.0000 mg | ORAL_TABLET | Freq: Four times a day (QID) | ORAL | 2 refills | Status: DC | PRN

## 2018-05-09 NOTE — Assessment & Plan Note (Signed)
stable overall by history and exam, recent data reviewed with pt, and pt to continue medical treatment as before,  to f/u any worsening symptoms or concerns  

## 2018-05-09 NOTE — Assessment & Plan Note (Signed)
C/w msk strain, for muscle relaxer prn,  to f/u any worsening symptoms or concerns

## 2018-05-09 NOTE — Assessment & Plan Note (Signed)
For f/u cysto next week

## 2018-05-09 NOTE — Patient Instructions (Signed)
Please take all new medication as prescribed - the muscle relaxer as needed  Please continue all other medications as before, and refills have been done if requested.  Please have the pharmacy call with any other refills you may need.  Please continue your efforts at being more active, low cholesterol diet, and weight control.  Please keep your appointments with your specialists as you may have planned  Good luck with your upcoming procedure!
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
## 2018-05-12 NOTE — Progress Notes (Signed)
Please place orders in epic. Pt. Has a preop om 05-15-18 Thank You!

## 2018-05-12 NOTE — Patient Instructions (Signed)
Antonio Reyes  05/12/2018   Your procedure is scheduled on: 05-17-18  Report to [facility] Main  Entrance  Report to admitting at      130 PM    Call this number if you have problems the morning of surgery [phone]    Remember: Do not eat food  :After Midnight.  You may have clear liquids until 0930 am then nothing by mouth    CLEAR LIQUID DIET   Foods Allowed                                                                     Foods Excluded  Coffee and tea, regular and decaf                             liquids that you cannot  Plain Jell-O in any flavor                                             see through such as: Fruit ices (not with fruit pulp)                                     milk, soups, orange juice  Iced Popsicles                                    All solid food Carbonated beverages, regular and diet                                    Cranberry, grape and apple juices Sports drinks like Gatorade Lightly seasoned clear broth or consume(fat free) Sugar, honey syrup   _____________________________________________________________________    BRUSH YOUR TEETH MORNING OF SURGERY AND RINSE YOUR MOUTH OUT, NO CHEWING GUM CANDY OR MINTS.     Take these medicines the morning of surgery with A SIP OF WATER: Flomax, metoprolol, flonase, lipitor                                You may not have any metal on your body including hair pins and              piercings  Do not wear jewelry,  lotions, powders or perfumes, deodorant                   Men may shave face and neck.   Do not bring valuables to the hospital. [place].  Contacts, dentures or bridgework may not be worn into surgery.       Patients discharged the day of surgery will not be allowed to  drive home. IF YOU ARE HAVING SURGERY AND GOING HOME THE SAME DAY, YOU MUST HAVE AN ADULT TO DRIVE YOU HOME AND BE WITH YOU FOR 24 HOURS. YOU  MAY GO HOME BY TAXI OR UBER OR ORTHERWISE, BUT AN ADULT MUST ACCOMPANY YOU HOME AND STAY WITH YOU FOR 24 HOURS.  Name and phone number of your driver:  Special Instructions: N/A              Please read over the following fact sheets you were given: _____________________________________________________________________             [facility] - Preparing for Surgery Before surgery, you can play an important role.  Because skin is not sterile, your skin needs to be as free of germs as possible.  You can reduce the number of germs on your skin by washing with CHG (chlorahexidine gluconate) soap before surgery.  CHG is an antiseptic cleaner which kills germs and bonds with the skin to continue killing germs even after washing. Please DO NOT use if you have an allergy to CHG or antibacterial soaps.  If your skin becomes reddened/irritated stop using the CHG and inform your nurse when you arrive at Short Stay. Do not shave (including legs and underarms) for at least 48 hours prior to the first CHG shower.  You may shave your face/neck. Please follow these instructions carefully:  1.  Shower with CHG Soap the night before surgery and the  morning of Surgery.  2.  If you choose to wash your hair, wash your hair first as usual with your  normal  shampoo.  3.  After you shampoo, rinse your hair and body thoroughly to remove the  shampoo.                           4.  Use CHG as you would any other liquid soap.  You can apply chg directly  to the skin and wash                       Gently with a scrungie or clean washcloth.  5.  Apply the CHG Soap to your body ONLY FROM THE NECK DOWN.   Do not use on face/ open                           Wound or open sores. Avoid contact with eyes, ears mouth and genitals (private parts).                       Wash face,  Genitals (private parts) with your normal soap.             6.  Wash thoroughly, paying special attention to the area where your surgery  will be  performed.  7.  Thoroughly rinse your body with warm water from the neck down.  8.  DO NOT shower/wash with your normal soap after using and rinsing off  the CHG Soap.                9.  Pat yourself dry with a clean towel.            10.  Wear clean pajamas.            11.  Place clean sheets on your bed the night of your first shower and do not  sleep with  pets. Day of Surgery : Do not apply any lotions/deodorants the morning of surgery.  Please wear clean clothes to the hospital/surgery center.  FAILURE TO FOLLOW THESE INSTRUCTIONS MAY RESULT IN THE CANCELLATION OF YOUR SURGERY PATIENT SIGNATURE_________________________________  NURSE SIGNATURE__________________________________  ________________________________________________________________________

## 2018-05-15 ENCOUNTER — Encounter (HOSPITAL_COMMUNITY)
Admission: RE | Admit: 2018-05-15 | Discharge: 2018-05-15 | Disposition: A | Discharge status: Home / Self Care | Payer: BLUE CROSS/BLUE SHIELD | Source: Ambulatory Visit | Attending: Urology | Admitting: Urology

## 2018-05-15 ENCOUNTER — Encounter (HOSPITAL_COMMUNITY): Payer: Self-pay

## 2018-05-15 ENCOUNTER — Other Ambulatory Visit: Payer: Self-pay

## 2018-05-15 ENCOUNTER — Encounter (HOSPITAL_COMMUNITY): Payer: Self-pay | Admitting: Physician Assistant

## 2018-05-15 DIAGNOSIS — Z01818 Encounter for other preprocedural examination: Secondary | ICD-10-CM | POA: Diagnosis not present

## 2018-05-15 DIAGNOSIS — R9431 Abnormal electrocardiogram [ECG] [EKG]: Secondary | ICD-10-CM | POA: Insufficient documentation

## 2018-05-15 HISTORY — DX: Personal history of urinary calculi: Z87.442

## 2018-05-15 LAB — BASIC METABOLIC PANEL
Anion gap: 8 (ref 5–15)
BUN: 15 mg/dL (ref 8–23)
CO2: 26 mmol/L (ref 22–32)
CREATININE: 1.17 mg/dL (ref 0.61–1.24)
Calcium: 9.3 mg/dL (ref 8.9–10.3)
Chloride: 106 mmol/L (ref 98–111)
GFR calc Af Amer: >60 mL/min (ref >60)
GFR calc non Af Amer: >60 mL/min (ref >60)
Glucose, Bld: 115 mg/dL — ABNORMAL HIGH (ref 70–99)
Potassium: 3.9 mmol/L (ref 3.5–5.1)
Sodium: 140 mmol/L (ref 135–145)

## 2018-05-15 LAB — CBC
HCT: 51.3 % (ref 39.0–52.0)
Hemoglobin: 16.6 g/dL (ref 13.0–17.0)
MCH: 30.2 pg (ref 26.0–34.0)
MCHC: 32.4 g/dL (ref 30.0–36.0)
MCV: 93.4 fL (ref 80.0–100.0)
Platelets: 194 10*3/uL (ref 150–400)
RBC: 5.49 MIL/uL (ref 4.22–5.81)
RDW: 12.1 % (ref 11.5–15.5)
WBC: 7.7 10*3/uL (ref 4.0–10.5)
nRBC: 0 % (ref 0.0–0.2)

## 2018-05-16 ENCOUNTER — Other Ambulatory Visit: Payer: Self-pay | Admitting: Internal Medicine

## 2018-05-16 ENCOUNTER — Ambulatory Visit (INDEPENDENT_AMBULATORY_CARE_PROVIDER_SITE_OTHER): Payer: BLUE CROSS/BLUE SHIELD | Admitting: Family

## 2018-05-16 ENCOUNTER — Encounter: Payer: Self-pay | Admitting: Family

## 2018-05-16 VITALS — BP 130/74 | HR 67 | Temp 98.4°F | Ht 76.0 in | Wt 271.1 lb

## 2018-05-16 DIAGNOSIS — J209 Acute bronchitis, unspecified: Secondary | ICD-10-CM

## 2018-05-16 MED ORDER — GENTAMICIN SULFATE 40 MG/ML IJ SOLN
5.0000 mg/kg | INTRAVENOUS | Status: DC
  Filled 2018-05-16: qty 12.5

## 2018-05-16 MED ORDER — HYDROCODONE-HOMATROPINE 5-1.5 MG/5ML PO SYRP: 5.0000 mL | ORAL_SOLUTION | Freq: Three times a day (TID) | ORAL | 0 refills | Status: DC | PRN

## 2018-05-16 MED ORDER — GENTAMICIN SULFATE 40 MG/ML IJ SOLN
500.0000 mg | Freq: Once | INTRAVENOUS | Status: DC
  Filled 2018-05-16: qty 12.5

## 2018-05-16 MED ORDER — DOXYCYCLINE HYCLATE 100 MG PO TABS: 100.0000 mg | ORAL_TABLET | Freq: Two times a day (BID) | ORAL | 0 refills | Status: DC

## 2018-05-16 MED ORDER — GENTAMICIN SULFATE 40 MG/ML IJ SOLN
5.0000 mg/kg | INTRAVENOUS | Status: DC
  Filled 2018-05-16 (×2): qty 12.75

## 2018-05-16 NOTE — Progress Notes (Signed)
Antonio Reyes is a 73 y.o. male with the following history as recorded in EpicCare:  Patient Active Problem List   Diagnosis Date Noted  . Bilateral posterior neck pain 05/09/2018  . Microhematuria 04/24/2018  . CKD (chronic kidney disease) stage 3, GFR 30-59 ml/min (HCC) 04/24/2018  . Dysuria 04/21/2018  . Allergic rhinitis 09/20/2017  . Nonallopathic lesion of sacral region 08/04/2017  . Nonallopathic lesion of lumbosacral region 08/04/2017  . Nonallopathic lesion of thoracic region 08/04/2017  . Bilateral foot pain 04/22/2017  . Left leg pain 04/22/2017  . Bilateral groin pain 04/22/2017  . Metatarsalgia 11/11/2016  . Hallux rigidus 06/11/2016  . Contusion of left knee 06/11/2016  . Hyperglycemia 04/21/2016  . Chest pain 06/05/2015  . Polycythemia, secondary 02/09/2013  . Dizziness 06/08/2011  . Chronic sinusitis 06/08/2011  . Encounter for well adult exam with abnormal findings 08/18/2010  . Ankle pain 08/18/2010  . Morbid obesity ([place]) 04/20/2010  . ABRASION, [place] 04/20/2010  . CHEST PAIN 07/22/2009  . GENITAL HERPES 04/12/2008  . ANXIETY 04/12/2008  . MIGRAINE, COMMON 04/12/2008  . BENIGN PROSTATIC HYPERTROPHY 04/12/2008  . COLONIC POLYPS, HX OF 04/12/2008  . LOW BACK PAIN 11/26/2006  . HYPERCHOLESTEROLEMIA 11/17/2006  . ERECTILE DYSFUNCTION 11/17/2006  . Essential hypertension 11/17/2006  . GERD 11/17/2006  . OSA (obstructive sleep apnea) 11/17/2006  . DIVERTICULITIS, HX OF 11/17/2006    Current Outpatient Medications  Medication Sig Dispense Refill  . aspirin 81 MG tablet Take 81 mg by mouth daily.      Marland Kitchen atorvastatin (LIPITOR) 40 MG tablet TAKE 1 TABLET BY MOUTH DAILY GENERIC EQUIVALENT FOR LIPITOR (Patient taking differently: Take 40 mg by mouth daily. ) 90 tablet 3  . fluticasone (FLONASE) 50 MCG/ACT nasal spray USE 2 SPRAYS IN EACH NOSTRIL DAILY GENERIC EQUIVALENT FOR FLONASE (Patient taking differently: Place 2 sprays into both nostrils daily. ) 48 g 1  .  gabapentin (NEURONTIN) 100 MG capsule Take 2 capsules (200 mg total) by mouth at bedtime. 60 capsule 3  . metoprolol succinate (TOPROL-XL) 50 MG 24 hr tablet TAKE 1 TABLET BY MOUTH DAILY WITH OR IMMEDIATELY FOLLOWING A MEAL 90 tablet 3  . montelukast (SINGULAIR) 10 MG tablet TAKE 1 TABLET BY MOUTH DAILY GENERIC EQUIVALENT FOR SINGULAIR (Patient taking differently: Take 10 mg by mouth at bedtime. TAKE 1 TABLET BY MOUTH DAILY GENERIC EQUIVALENT FOR SINGULAIR) 90 tablet 0  . ondansetron (ZOFRAN) 4 MG tablet Take 1 tablet (4 mg total) by mouth every 6 (six) hours as needed for nausea. 12 tablet 0  . oxyCODONE-acetaminophen (PERCOCET) 5-325 MG tablet Take 1 tablet by mouth every 4 (four) hours as needed for moderate pain. 10 tablet 0  . tamsulosin (FLOMAX) 0.4 MG CAPS capsule Take 1 capsule (0.4 mg total) by mouth daily. 10 capsule 0  . tiZANidine (ZANAFLEX) 2 MG tablet Take 1 tablet (2 mg total) by mouth every 6 (six) hours as needed for muscle spasms. 30 tablet 2  . doxycycline (VIBRA-TABS) 100 MG tablet Take 1 tablet (100 mg total) by mouth 2 (two) times daily. 20 tablet 0  . HYDROcodone-homatropine (HYCODAN) 5-1.5 MG/5ML syrup Take 5 mLs by mouth every 8 (eight) hours as needed for cough. 120 mL 0   No current facility-administered medications for this visit.    Facility-Administered Medications Ordered in Other Visits  Medication Dose Route Frequency Provider Last Rate Last Dose  . [START ON 05/17/2018] gentamicin (GARAMYCIN) 500 mg in dextrose 5 % 100 mL IVPB  500  mg Intravenous Once Alexis Frock, MD        Allergies: Olive tree  Past Medical History:  Diagnosis Date  . Anxiety   . Benign neoplasm of stomach   . BPH (benign prostatic hypertrophy)   . Cancer ([place]) 2000   skin cancer   left ear  . Diverticulitis   . ED (erectile dysfunction)   . GERD (gastroesophageal reflux disease)   . History of herpes genitalis   . History of kidney stones   . HLD (hyperlipidemia)   . HTN  (hypertension)   . Low back pain   . Migraine    hx of  . Rotator cuff tear   . Sleep apnea    CPAP  . Tubular adenoma of colon 05/2008    Past Surgical History:  Procedure Laterality Date  . COLONOSCOPY    . HERNIA REPAIR     umbilical with mesh  . KNEE SURGERY     mensicus   2011  . POLYPECTOMY      Family History  Problem Relation Age of Onset  . Colon cancer Mother 12    Social History   Tobacco Use  . Smoking status: Current Some Day Smoker    Types: Cigars  . Smokeless tobacco: Never Used  . Tobacco comment: occasionally  every 3 months  Substance Use Topics  . Alcohol use: Yes    Alcohol/week: 1.0 standard drinks    Types: 1 Standard drinks or equivalent per week    Comment: social 2 beers a month      Subjective:  Patient presents with concerns for 1 week history of cough/ sore throat; wife was sick with similar symptoms- negative for flu; no chest pain or shortness of breath; does have [place] at home;  Wife is currently on Doxycycline;  Requesting same treatment she is taking.        Objective:  Vitals:   05/16/18 1338  BP: 130/74  Pulse: 67  Temp: 98.4 F (36.9 C)  TempSrc: Oral  SpO2: 95%  Weight: 271 lb 1.9 oz (123 kg)  Height: 6\' 4"  (1.93 m)    General: Well developed, well nourished, in no acute distress  Skin : Warm and dry.  Head: Normocephalic and atraumatic  Eyes: Sclera and conjunctiva clear; pupils round and reactive to light; extraocular movements intact  Ears: External normal; canals clear; tympanic membranes normal  Oropharynx: Pink, supple. No suspicious lesions  Neck: Supple without thyromegaly, adenopathy  Lungs: Respirations unlabored; clear to auscultation bilaterally without wheeze, rales, rhonchi  CVS exam: normal rate and regular rhythm.  Neurologic: Alert and oriented; speech intact; face symmetrical; moves all extremities well; CNII-XII intact without focal deficit   Assessment:  1. Acute bronchitis, unspecified organism      Plan:  Rx for Doxycycline 100 mg bid x 10 days, Hycodan cough syrup 1 tsp po q 6-8 hours; increase fluids, rest and follow-up worse, no better.    No follow-ups on file.  No orders of the defined types were placed in this encounter.   Requested Prescriptions   Signed Prescriptions Disp Refills  . doxycycline (VIBRA-TABS) 100 MG tablet 20 tablet 0    Sig: Take 1 tablet (100 mg total) by mouth 2 (two) times daily.  Marland Kitchen HYDROcodone-homatropine (HYCODAN) 5-1.5 MG/5ML syrup 120 mL 0    Sig: Take 5 mLs by mouth every 8 (eight) hours as needed for cough.

## 2018-05-17 ENCOUNTER — Encounter (HOSPITAL_COMMUNITY): Admission: RE | Payer: Self-pay | Source: Home / Self Care

## 2018-05-17 ENCOUNTER — Ambulatory Visit (HOSPITAL_COMMUNITY): Admission: RE | Admit: 2018-05-17 | Payer: BLUE CROSS/BLUE SHIELD | Source: Home / Self Care | Admitting: Urology

## 2018-05-17 SURGERY — CYSTOURETEROSCOPY, WITH RETROGRADE PYELOGRAM AND STENT INSERTION
Anesthesia: General | Laterality: Left

## 2018-05-18 ENCOUNTER — Ambulatory Visit (AMBULATORY_SURGERY_CENTER): Payer: Self-pay | Admitting: *Deleted

## 2018-05-18 VITALS — Ht 76.0 in | Wt 266.0 lb

## 2018-05-18 DIAGNOSIS — Z800 Family history of malignant neoplasm of digestive organs: Secondary | ICD-10-CM

## 2018-05-18 DIAGNOSIS — Z86010 Personal history of colonic polyps: Secondary | ICD-10-CM

## 2018-05-18 MED ORDER — NA SULFATE-K SULFATE-MG SULF 17.5-3.13-1.6 GM/177ML PO SOLN: ORAL | 0 refills | Status: DC

## 2018-05-18 NOTE — Progress Notes (Signed)
Patient denies any allergies to eggs or soy. Patient denies any problems with anesthesia/sedation. Patient denies any oxygen use at home. Patient denies taking any diet/weight loss medications or blood thinners. EMMI education offered, pt declined.  

## 2018-05-25 ENCOUNTER — Encounter (HOSPITAL_COMMUNITY): Payer: Self-pay

## 2018-05-25 NOTE — Pre-Procedure Instructions (Signed)
The following are in epic: EKG 05/15/2018 Hgb A1C (5.7) 04/18/2018 CXR 09/20/2017

## 2018-05-25 NOTE — Patient Instructions (Addendum)
Your procedure is scheduled on: Wednesday, May 31, 2018   Surgery Time:  3:30PM-4:30PM   Report to [place]  Entrance    Report to admitting at 1:30 PM   Call this number if you have problems the morning of surgery [phone]   Do not eat food:After Midnight.   May have liquids until 9:30AM day of surgery   CLEAR LIQUID DIET  Foods Allowed                                                                     Foods Excluded  Water, Black Coffee and tea, regular and decaf                             liquids that you cannot  Plain Jell-O in any flavor                                             see through such as: Fruit ices (not with fruit pulp)                                     milk, soups, orange juice  Iced Popsicles                                    All solid food Carbonated beverages, regular and diet                                    Cranberry, grape and apple juices Sports drinks like Gatorade Lightly seasoned clear broth or consume(fat free) Sugar, honey syrup  Sample Menu Breakfast                                Lunch                                     Supper Cranberry juice                    Beef broth                            Chicken broth Jell-O                                     Grape juice                           Apple juice Coffee or tea  Jell-O                                      Popsicle                                                Coffee or tea                        Coffee or tea   Brush your teeth the morning of surgery.   Do NOT smoke after Midnight   Take these medicines the morning of surgery with A SIP OF WATER:  Montelukast, Metoprolol, Tamsulosin   May use Flonase day of surgery                               You may not have any metal on your body including jewelry, and body piercings             Do not wear lotions, powders, perfumes/cologne, or deodorant                         Men  may shave face and neck.   Do not bring valuables to the hospital. [place].   Contacts, dentures or bridgework may not be worn into surgery.   Leave suitcase in the car. After surgery it may be brought to your room.    Special Instructions: Bring a copy of your healthcare power of attorney and living will documents         the day of surgery if you haven't scanned them in before.              Please read over the following fact sheets you were given:  [facility] - Preparing for Surgery Before surgery, you can play an important role.  Because skin is not sterile, your skin needs to be as free of germs as possible.  You can reduce the number of germs on your skin by washing with CHG (chlorahexidine gluconate) soap before surgery.  CHG is an antiseptic cleaner which kills germs and bonds with the skin to continue killing germs even after washing. Please DO NOT use if you have an allergy to CHG or antibacterial soaps.  If your skin becomes reddened/irritated stop using the CHG and inform your nurse when you arrive at Short Stay. Do not shave (including legs and underarms) for at least 48 hours prior to the first CHG shower.  You may shave your face/neck.  Please follow these instructions carefully:  1.  Shower with CHG Soap the night before surgery and the  morning of surgery.  2.  If you choose to wash your hair, wash your hair first as usual with your normal  shampoo.  3.  After you shampoo, rinse your hair and body thoroughly to remove the shampoo.                             4.  Use CHG as you would any other liquid soap.  You can apply chg directly to the skin and wash.  Gently with a scrungie or clean washcloth.  5.  Apply the CHG Soap to your body ONLY FROM THE NECK DOWN.   Do   not use on face/ open                           Wound or open sores. Avoid contact with eyes, ears mouth and   genitals (private parts).                        Wash face,  Genitals (private parts) with your normal soap.             6.  Wash thoroughly, paying special attention to the area where your    surgery  will be performed.  7.  Thoroughly rinse your body with warm water from the neck down.  8.  DO NOT shower/wash with your normal soap after using and rinsing off the CHG Soap.                9.  Pat yourself dry with a clean towel.            10.  Wear clean pajamas.            11.  Place clean sheets on your bed the night of your first shower and do not  sleep with pets. Day of Surgery : Do not apply any lotions/deodorants the morning of surgery.  Please wear clean clothes to the hospital/surgery center.  FAILURE TO FOLLOW THESE INSTRUCTIONS MAY RESULT IN THE CANCELLATION OF YOUR SURGERY  PATIENT SIGNATURE_________________________________  NURSE SIGNATURE__________________________________  ________________________________________________________________________

## 2018-05-26 ENCOUNTER — Encounter (HOSPITAL_COMMUNITY)
Admission: RE | Admit: 2018-05-26 | Discharge: 2018-05-26 | Disposition: A | Discharge status: Home / Self Care | Payer: BLUE CROSS/BLUE SHIELD | Source: Ambulatory Visit | Attending: Urology | Admitting: Urology

## 2018-05-26 ENCOUNTER — Encounter (HOSPITAL_COMMUNITY): Payer: Self-pay

## 2018-05-26 ENCOUNTER — Other Ambulatory Visit: Payer: Self-pay

## 2018-05-26 ENCOUNTER — Encounter: Payer: BLUE CROSS/BLUE SHIELD | Admitting: Gastroenterology

## 2018-05-26 DIAGNOSIS — Z01812 Encounter for preprocedural laboratory examination: Secondary | ICD-10-CM | POA: Insufficient documentation

## 2018-05-26 DIAGNOSIS — N211 Calculus in urethra: Secondary | ICD-10-CM | POA: Diagnosis not present

## 2018-05-26 HISTORY — DX: Secondary polycythemia: D75.1

## 2018-05-26 HISTORY — DX: Cervicalgia: M54.2

## 2018-05-26 HISTORY — DX: Obesity, unspecified: E66.9

## 2018-05-26 HISTORY — DX: Diverticulosis of intestine, part unspecified, without perforation or abscess without bleeding: K57.90

## 2018-05-26 HISTORY — DX: Chronic sinusitis, unspecified: J32.9

## 2018-05-26 HISTORY — DX: Unspecified hydronephrosis: N13.30

## 2018-05-26 HISTORY — DX: Chronic kidney disease, stage 3 (moderate): N18.3

## 2018-05-26 HISTORY — DX: Unspecified displaced fracture of sixth cervical vertebra, initial encounter for closed fracture: S12.500A

## 2018-05-26 HISTORY — DX: Chronic kidney disease, stage 3 unspecified: N18.30

## 2018-05-26 HISTORY — DX: Allergic rhinitis, unspecified: J30.9

## 2018-05-26 HISTORY — DX: Atherosclerosis of aorta: I70.0

## 2018-05-26 HISTORY — DX: Dizziness and giddiness: R42

## 2018-05-26 HISTORY — DX: Unspecified abdominal hernia without obstruction or gangrene: K46.9

## 2018-05-26 HISTORY — DX: Unilateral inguinal hernia, without obstruction or gangrene, not specified as recurrent: K40.90

## 2018-05-26 LAB — BASIC METABOLIC PANEL
Anion gap: 10 (ref 5–15)
BUN: 21 mg/dL (ref 8–23)
CALCIUM: 9.3 mg/dL (ref 8.9–10.3)
CO2: 22 mmol/L (ref 22–32)
Chloride: 107 mmol/L (ref 98–111)
Creatinine, Ser: 1.29 mg/dL — ABNORMAL HIGH (ref 0.61–1.24)
GFR calc Af Amer: >60 mL/min (ref >60)
GFR calc non Af Amer: 56 mL/min — ABNORMAL LOW (ref >60)
Glucose, Bld: 115 mg/dL — ABNORMAL HIGH (ref 70–99)
Potassium: 4.1 mmol/L (ref 3.5–5.1)
Sodium: 139 mmol/L (ref 135–145)

## 2018-05-26 LAB — CBC
HCT: 52.7 % — ABNORMAL HIGH (ref 39.0–52.0)
HEMOGLOBIN: 16.9 g/dL (ref 13.0–17.0)
MCH: 30 pg (ref 26.0–34.0)
MCHC: 32.1 g/dL (ref 30.0–36.0)
MCV: 93.4 fL (ref 80.0–100.0)
Platelets: 246 10*3/uL (ref 150–400)
RBC: 5.64 MIL/uL (ref 4.22–5.81)
RDW: 12.1 % (ref 11.5–15.5)
WBC: 7 10*3/uL (ref 4.0–10.5)
nRBC: 0 % (ref 0.0–0.2)

## 2018-05-26 NOTE — Pre-Procedure Instructions (Signed)
BMP results 05/26/2018 sent to Dr. Tresa Moore via epic.

## 2018-05-29 NOTE — Anesthesia Preprocedure Evaluation (Addendum)
Anesthesia Evaluation  Patient identified by MRN, date of birth, ID band Patient awake    Reviewed: Allergy & Precautions, NPO status , Patient's Chart, lab work & pertinent test results, reviewed documented beta blocker date and time   Airway Mallampati: II  TM Distance: >3 FB Neck ROM: Full    Dental  (+) Teeth Intact, Dental Advisory Given, Caps,    Pulmonary sleep apnea and Continuous Positive Airway Pressure Ventilation , Current Smoker,    Pulmonary exam normal breath sounds clear to auscultation       Cardiovascular hypertension, Pt. on home beta blockers (-) angina+ Peripheral Vascular Disease  (-) CAD and (-) Past MI Normal cardiovascular exam Rhythm:Regular Rate:Normal     Neuro/Psych  Headaches, PSYCHIATRIC DISORDERS Anxiety    GI/Hepatic Neg liver ROS, GERD  Medicated and Controlled,  Endo/Other  negative endocrine ROSObesity  Renal/GU Renal InsufficiencyRenal disease     Musculoskeletal negative musculoskeletal ROS (+)   Abdominal   Peds  Hematology  (+) Blood dyscrasia (Polycythemia), ,   Anesthesia Other Findings Day of surgery medications reviewed with the patient.  Reproductive/Obstetrics                           Anesthesia Physical Anesthesia Plan  ASA: II  Anesthesia Plan: General   Post-op Pain Management:    Induction: Intravenous  PONV Risk Score and Plan: 3 and Midazolam, Dexamethasone and Ondansetron  Airway Management Planned: LMA  Additional Equipment:   Intra-op Plan:   Post-operative Plan: Extubation in OR  Informed Consent: I have reviewed the patients History and Physical, chart, labs and discussed the procedure including the risks, benefits and alternatives for the proposed anesthesia with the patient or authorized representative who has indicated his/her understanding and acceptance.     Dental advisory given  Plan Discussed with:  CRNA  Anesthesia Plan Comments: (See PAT note 05/26/18, Konrad Felix, PA-C)       Anesthesia Quick Evaluation

## 2018-05-29 NOTE — Progress Notes (Signed)
Anesthesia Chart Review   Case:  [id number] Date/Time:  05/31/18 1515   Procedures:      CYSTOSCOPY WITH RETROGRADE PYELOGRAM, URETEROSCOPY AND STENT PLACEMENT (Left ) - 1 HR     HOLMIUM LASER APPLICATION (Left )   Anesthesia type:  General   Pre-op diagnosis:  LEFT URETERAL STONE   Location:  [facility] [facility] / [facility]   Surgeon:  Alexis Frock, MD      DISCUSSION: 73yo current smoker with h/o HTN, HLD, polycythemia, CKD, GERD, OSA w/CPAP, anxiety, BPH, left ureteral stone scheduled for above surgery 05/31/18 with Dr. Alexis Frock.   Pt last seen by PCP 05/09/18, stable at this visit.  He was treated for bronchitis 05/16/18 with doxycycline.  He has completed antibiotics and presented asymptomatic at PAT visit 05/26/18.    Pt can proceed with planned procedure barring acute status change.  VS: BP 127/70   Pulse (!) 55   Temp 36.7 C (Oral)   Resp 16   Ht 6\' 4"  (1.93 m)   Wt 118.9 kg   SpO2 96%   BMI 31.90 kg/m   PROVIDERS: [name], MD is PCP    LABS: Labs reviewed: Acceptable for surgery. (all labs ordered are listed, but only abnormal results are displayed)  Labs Reviewed  CBC - Abnormal; Notable for the following components:      Result Value   HCT 52.7 (*)    All other components within normal limits  BASIC METABOLIC PANEL - Abnormal; Notable for the following components:   Glucose, Bld 115 (*)    Creatinine, Ser 1.29 (*)    GFR calc non Af Amer 56 (*)    All other components within normal limits     IMAGES:   EKG: 05/15/2018 Rate 60 bpm Normal sinus rhythm  Left axis deviation  Poor r wave progression Probably normal variant   CV:  Past Medical History:  Diagnosis Date  . Abdominal hernia   . Allergic rhinitis   . Anxiety   . Aortic atherosclerosis ([place]) 04/25/2018   noted on CT renal  . Benign neoplasm of stomach   . BPH (benign prostatic hypertrophy)    pt unaware  . C6 cervical fracture (HCC)    C7,C8  . Cancer ([place]) 2000   skin cancer    left ear  . Chronic sinusitis   . CKD (chronic kidney disease), stage III ([place])    pt unaware  . Diverticulitis   . Diverticulosis 03/27/2015   Moderate, Noted on colonoscopy  . Dizziness   . ED (erectile dysfunction)   . GERD (gastroesophageal reflux disease)   . History of herpes genitalis   . History of kidney stones 04/25/2018   4 mm left UVJ stone, 10 mm bladder stone, noted on CT renal  . HLD (hyperlipidemia)   . HTN (hypertension)   . Hydronephrosis    Left, Moderate, noted on CT renal  . Inguinal hernia 04/25/2018   Bilateral, noted on CT renal, pt unaware  . Low back pain   . Migraine    hx of  . Neck pain, bilateral   . Obesity   . Polycythemia 2000   per Dr. Elsworth Soho  . Rotator cuff tear   . Sleep apnea    CPAP  . Tubular adenoma of colon 05/2008    Past Surgical History:  Procedure Laterality Date  . COLONOSCOPY  03/27/2015  . HERNIA REPAIR     umbilical with mesh  . KNEE SURGERY  Right    mensicus   2011  . POLYPECTOMY    . UPPER GI ENDOSCOPY  06/16/2005    MEDICATIONS: . aspirin 81 MG tablet  . atorvastatin (LIPITOR) 40 MG tablet  . doxycycline (VIBRA-TABS) 100 MG tablet  . fluticasone (FLONASE) 50 MCG/ACT nasal spray  . HYDROcodone-homatropine (HYCODAN) 5-1.5 MG/5ML syrup  . ibuprofen (ADVIL,MOTRIN) 200 MG tablet  . metoprolol succinate (TOPROL-XL) 50 MG 24 hr tablet  . montelukast (SINGULAIR) 10 MG tablet  . Na Sulfate-K Sulfate-Mg Sulf 17.5-3.13-1.6 GM/177ML SOLN  . tamsulosin (FLOMAX) 0.4 MG CAPS capsule   No current facility-administered medications for this encounter.    Marland Kitchen gentamicin (GARAMYCIN) 500 mg in dextrose 5 % 100 mL IVPB     Maia Plan [facility] Pre-Surgical Testing [phone]  05/29/18 2:49 PM

## 2018-05-31 ENCOUNTER — Other Ambulatory Visit: Payer: Self-pay

## 2018-05-31 ENCOUNTER — Encounter (HOSPITAL_COMMUNITY): Payer: Self-pay | Admitting: Certified Registered Nurse Anesthetist

## 2018-05-31 ENCOUNTER — Ambulatory Visit (HOSPITAL_COMMUNITY)
Admission: RE | Admit: 2018-05-31 | Discharge: 2018-05-31 | Disposition: A | Discharge status: Home / Self Care | Payer: BLUE CROSS/BLUE SHIELD | Attending: Urology | Admitting: Urology

## 2018-05-31 ENCOUNTER — Ambulatory Visit (HOSPITAL_COMMUNITY): Payer: BLUE CROSS/BLUE SHIELD

## 2018-05-31 ENCOUNTER — Ambulatory Visit (HOSPITAL_COMMUNITY): Payer: BLUE CROSS/BLUE SHIELD | Admitting: Physician Assistant

## 2018-05-31 ENCOUNTER — Ambulatory Visit (HOSPITAL_COMMUNITY): Payer: BLUE CROSS/BLUE SHIELD | Admitting: Certified Registered Nurse Anesthetist

## 2018-05-31 ENCOUNTER — Encounter (HOSPITAL_COMMUNITY)
Admission: RE | Disposition: A | Discharge status: Home / Self Care | Payer: Self-pay | Source: Home / Self Care | Attending: Urology

## 2018-05-31 DIAGNOSIS — N200 Calculus of kidney: Secondary | ICD-10-CM | POA: Diagnosis not present

## 2018-05-31 DIAGNOSIS — I129 Hypertensive chronic kidney disease with stage 1 through stage 4 chronic kidney disease, or unspecified chronic kidney disease: Secondary | ICD-10-CM | POA: Diagnosis not present

## 2018-05-31 DIAGNOSIS — G4730 Sleep apnea, unspecified: Secondary | ICD-10-CM | POA: Diagnosis not present

## 2018-05-31 DIAGNOSIS — E669 Obesity, unspecified: Secondary | ICD-10-CM | POA: Insufficient documentation

## 2018-05-31 DIAGNOSIS — N183 Chronic kidney disease, stage 3 (moderate): Secondary | ICD-10-CM | POA: Diagnosis not present

## 2018-05-31 DIAGNOSIS — Z6831 Body mass index (BMI) 31.0-31.9, adult: Secondary | ICD-10-CM | POA: Diagnosis not present

## 2018-05-31 DIAGNOSIS — F17290 Nicotine dependence, other tobacco product, uncomplicated: Secondary | ICD-10-CM | POA: Diagnosis not present

## 2018-05-31 DIAGNOSIS — N201 Calculus of ureter: Secondary | ICD-10-CM | POA: Diagnosis not present

## 2018-05-31 DIAGNOSIS — Z85828 Personal history of other malignant neoplasm of skin: Secondary | ICD-10-CM | POA: Insufficient documentation

## 2018-05-31 HISTORY — PX: HOLMIUM LASER APPLICATION: SHX5852

## 2018-05-31 HISTORY — PX: CYSTOSCOPY WITH RETROGRADE PYELOGRAM, URETEROSCOPY AND STENT PLACEMENT: SHX5789

## 2018-05-31 SURGERY — CYSTOURETEROSCOPY, WITH RETROGRADE PYELOGRAM AND STENT INSERTION
Anesthesia: General | Laterality: Left

## 2018-05-31 MED ORDER — CEFAZOLIN SODIUM-DEXTROSE 2-4 GM/100ML-% IV SOLN
INTRAVENOUS | Status: AC
  Filled 2018-05-31: qty 100

## 2018-05-31 MED ORDER — LIDOCAINE 2% (20 MG/ML) 5 ML SYRINGE
INTRAMUSCULAR | Status: AC
  Filled 2018-05-31: qty 5

## 2018-05-31 MED ORDER — ONDANSETRON HCL 4 MG/2ML IJ SOLN
INTRAMUSCULAR | Status: DC | PRN
  Administered 2018-05-31: 4 mg via INTRAVENOUS

## 2018-05-31 MED ORDER — PROPOFOL 10 MG/ML IV BOLUS
INTRAVENOUS | Status: DC | PRN
  Administered 2018-05-31: 200 mg via INTRAVENOUS

## 2018-05-31 MED ORDER — ONDANSETRON HCL 4 MG/2ML IJ SOLN
INTRAMUSCULAR | Status: AC
  Filled 2018-05-31: qty 2

## 2018-05-31 MED ORDER — DEXAMETHASONE SODIUM PHOSPHATE 10 MG/ML IJ SOLN
INTRAMUSCULAR | Status: AC
  Filled 2018-05-31: qty 1

## 2018-05-31 MED ORDER — CEFAZOLIN (ANCEF) 1 G IV SOLR: 2.0000 g | INTRAVENOUS | Status: DC

## 2018-05-31 MED ORDER — MIDAZOLAM HCL 2 MG/2ML IJ SOLN
INTRAMUSCULAR | Status: DC | PRN
  Administered 2018-05-31: 2 mg via INTRAVENOUS

## 2018-05-31 MED ORDER — CEFAZOLIN SODIUM-DEXTROSE 2-4 GM/100ML-% IV SOLN
2.0000 g | INTRAVENOUS | Status: AC
  Administered 2018-05-31: 2 g via INTRAVENOUS

## 2018-05-31 MED ORDER — PROPOFOL 10 MG/ML IV BOLUS
INTRAVENOUS | Status: AC
  Filled 2018-05-31: qty 40

## 2018-05-31 MED ORDER — LIDOCAINE 2% (20 MG/ML) 5 ML SYRINGE
INTRAMUSCULAR | Status: DC | PRN
  Administered 2018-05-31: 60 mg via INTRAVENOUS

## 2018-05-31 MED ORDER — SODIUM CHLORIDE 0.9 % IV SOLN
INTRAVENOUS | Status: DC | PRN
  Administered 2018-05-31: 10 mL

## 2018-05-31 MED ORDER — MIDAZOLAM HCL 2 MG/2ML IJ SOLN
INTRAMUSCULAR | Status: AC
  Filled 2018-05-31: qty 2

## 2018-05-31 MED ORDER — ACETAMINOPHEN 500 MG PO TABS
1000.0000 mg | ORAL_TABLET | Freq: Once | ORAL | Status: AC
  Administered 2018-05-31: 1000 mg via ORAL
  Filled 2018-05-31: qty 2

## 2018-05-31 MED ORDER — EPHEDRINE SULFATE-NACL 50-0.9 MG/10ML-% IV SOSY
PREFILLED_SYRINGE | INTRAVENOUS | Status: DC | PRN
  Administered 2018-05-31 (×2): 10 mg via INTRAVENOUS

## 2018-05-31 MED ORDER — LACTATED RINGERS IV SOLN
INTRAVENOUS | Status: DC
  Administered 2018-05-31: 14:00:00 via INTRAVENOUS

## 2018-05-31 MED ORDER — FENTANYL CITRATE (PF) 100 MCG/2ML IJ SOLN: 25.0000 ug | INTRAMUSCULAR | Status: DC | PRN

## 2018-05-31 MED ORDER — FENTANYL CITRATE (PF) 100 MCG/2ML IJ SOLN
INTRAMUSCULAR | Status: AC
  Filled 2018-05-31: qty 2

## 2018-05-31 MED ORDER — FENTANYL CITRATE (PF) 100 MCG/2ML IJ SOLN
INTRAMUSCULAR | Status: DC | PRN
  Administered 2018-05-31 (×2): 50 ug via INTRAVENOUS

## 2018-05-31 MED ORDER — EPHEDRINE 5 MG/ML INJ
INTRAVENOUS | Status: AC
  Filled 2018-05-31: qty 10

## 2018-05-31 MED ORDER — ONDANSETRON HCL 4 MG/2ML IJ SOLN: 4.0000 mg | Freq: Once | INTRAMUSCULAR | Status: DC | PRN

## 2018-05-31 MED ORDER — DEXAMETHASONE SODIUM PHOSPHATE 4 MG/ML IJ SOLN
INTRAMUSCULAR | Status: DC | PRN
  Administered 2018-05-31: 10 mg via INTRAVENOUS

## 2018-05-31 SURGICAL SUPPLY — 27 items
BAG URO CATCHER STRL LF (MISCELLANEOUS) ×3 IMPLANT
BASKET LASER NITINOL 1.9FR (BASKET) ×2 IMPLANT
BSKT STON RTRVL 120 1.9FR (BASKET) ×1
CATH INTERMIT  6FR 70CM (CATHETERS) ×3 IMPLANT
CLOTH BEACON ORANGE TIMEOUT ST (SAFETY) ×3 IMPLANT
COVER SURGICAL LIGHT HANDLE (MISCELLANEOUS) ×1 IMPLANT
COVER WAND RF STERILE (DRAPES) IMPLANT
EXTRACTOR STONE 1.7FRX115CM (UROLOGICAL SUPPLIES) IMPLANT
FIBER LASER FLEXIVA 1000 (UROLOGICAL SUPPLIES) IMPLANT
FIBER LASER FLEXIVA 365 (UROLOGICAL SUPPLIES) IMPLANT
FIBER LASER FLEXIVA 550 (UROLOGICAL SUPPLIES) IMPLANT
FIBER LASER TRAC TIP (UROLOGICAL SUPPLIES) IMPLANT
GLOVE BIOGEL M STRL SZ7.5 (GLOVE) ×3 IMPLANT
GOWN STRL REUS W/TWL LRG LVL3 (GOWN DISPOSABLE) ×6 IMPLANT
GUIDEWIRE ANG ZIPWIRE 038X150 (WIRE) ×3 IMPLANT
GUIDEWIRE STR DUAL SENSOR (WIRE) ×3 IMPLANT
IV NS 1000ML (IV SOLUTION)
IV NS 1000ML BAXH (IV SOLUTION) ×1 IMPLANT
KIT TURNOVER KIT A (KITS) ×2 IMPLANT
MANIFOLD NEPTUNE II (INSTRUMENTS) ×3 IMPLANT
PACK CYSTO (CUSTOM PROCEDURE TRAY) ×3 IMPLANT
SHEATH URETERAL 12FRX28CM (UROLOGICAL SUPPLIES) IMPLANT
SHEATH URETERAL 12FRX35CM (MISCELLANEOUS) IMPLANT
SYR CONTROL 10ML LL (SYRINGE) ×1 IMPLANT
TUBE FEEDING 8FR 16IN STR KANG (MISCELLANEOUS) ×3 IMPLANT
TUBING CONNECTING 10 (TUBING) ×2 IMPLANT
TUBING CONNECTING 10' (TUBING) ×1

## 2018-05-31 NOTE — Discharge Instructions (Signed)
1 - You may have urinary urgency (bladder spasms) and bloody urine on / off x few days. This is normal.  2 - Call MD or go to ER for fever >102, severe pain / nausea / vomiting not relieved by medications, or acute change in medical status   General Anesthesia, Adult, Care After This sheet gives you information about how to care for yourself after your procedure. Your health care provider may also give you more specific instructions. If you have problems or questions, contact your health care provider. What can I expect after the procedure? After the procedure, the following side effects are common:  Pain or discomfort at the IV site.  Nausea.  Vomiting.  Sore throat.  Trouble concentrating.  Feeling cold or chills.  Weak or tired.  Sleepiness and fatigue.  Soreness and body aches. These side effects can affect parts of the body that were not involved in surgery. Follow these instructions at home:  For at least 24 hours after the procedure:  Have a responsible adult stay with you. It is important to have someone help care for you until you are awake and alert.  Rest as needed.  Do not: ? Participate in activities in which you could fall or become injured. ? Drive. ? Use heavy machinery. ? Drink alcohol. ? Take sleeping pills or medicines that cause drowsiness. ? Make important decisions or sign legal documents. ? Take care of children on your own. Eating and drinking  Follow any instructions from your health care provider about eating or drinking restrictions.  When you feel hungry, start by eating small amounts of foods that are soft and easy to digest (bland), such as toast. Gradually return to your regular diet.  Drink enough fluid to keep your urine pale yellow.  If you vomit, rehydrate by drinking water, juice, or clear broth. General instructions  If you have sleep apnea, surgery and certain medicines can increase your risk for breathing problems. Follow  instructions from your health care provider about wearing your sleep device: ? Anytime you are sleeping, including during daytime naps. ? While taking prescription pain medicines, sleeping medicines, or medicines that make you drowsy.  Return to your normal activities as told by your health care provider. Ask your health care provider what activities are safe for you.  Take over-the-counter and prescription medicines only as told by your health care provider.  If you smoke, do not smoke without supervision.  Keep all follow-up visits as told by your health care provider. This is important. Contact a health care provider if:  You have nausea or vomiting that does not get better with medicine.  You cannot eat or drink without vomiting.  You have pain that does not get better with medicine.  You are unable to pass urine.  You develop a skin rash.  You have a fever.  You have redness around your IV site that gets worse. Get help right away if:  You have difficulty breathing.  You have chest pain.  You have blood in your urine or stool, or you vomit blood. Summary  After the procedure, it is common to have a sore throat or nausea. It is also common to feel tired.  Have a responsible adult stay with you for the first 24 hours after general anesthesia. It is important to have someone help care for you until you are awake and alert.  When you feel hungry, start by eating small amounts of foods that are soft and  easy to digest (bland), such as toast. Gradually return to your regular diet.  Drink enough fluid to keep your urine pale yellow.  Return to your normal activities as told by your health care provider. Ask your health care provider what activities are safe for you. This information is not intended to replace advice given to you by your health care provider. Make sure you discuss any questions you have with your health care provider. Document Released: 06/14/2000 Document  Revised: 10/22/2016 Document Reviewed: 10/22/2016 Elsevier Interactive Patient Education  2019 [employer].

## 2018-05-31 NOTE — Brief Op Note (Signed)
05/31/2018  3:53 PM  PATIENT:  Antonio Reyes  73 y.o. male  PRE-OPERATIVE DIAGNOSIS:  LEFT URETERAL STONE  POST-OPERATIVE DIAGNOSIS:  LEFT renal stone  PROCEDURE: cystoscopy, left retrogrades, left ureteroscopy, removal stone with basket  SURGEON:  Surgeon(s) and Role:    Alexis Frock, MD - Primary  PHYSICIAN ASSISTANT:   ASSISTANTS: none   ANESTHESIA:   general  EBL:  0 mL   BLOOD ADMINISTERED:none  DRAINS: none   LOCAL MEDICATIONS USED:  NONE  SPECIMEN:  Source of Specimen:  left renal stone  DISPOSITION OF SPECIMEN:  [facility] for compositional analysis  COUNTS:  YES  TOURNIQUET:  * No tourniquets in log *  DICTATION: .Other Dictation: Dictation Number [id number]  PLAN OF CARE: Discharge to home after PACU  PATIENT DISPOSITION:  PACU - hemodynamically stable.   Delay start of Pharmacological VTE agent (>24hrs) due to surgical blood loss or risk of bleeding: yes

## 2018-05-31 NOTE — Anesthesia Postprocedure Evaluation (Signed)
Anesthesia Post Note  Patient: Antonio Reyes  Procedure(s) Performed: CYSTOSCOPY WITH RETROGRADE PYELOGRAM, URETEROSCOPY AND STENT PLACEMENT (Left ) HOLMIUM LASER APPLICATION (Left )     Patient location during evaluation: PACU Anesthesia Type: General Level of consciousness: awake and alert Pain management: pain level controlled Vital Signs Assessment: post-procedure vital signs reviewed and stable Respiratory status: spontaneous breathing, nonlabored ventilation and respiratory function stable Cardiovascular status: blood pressure returned to baseline and stable Postop Assessment: no apparent nausea or vomiting Anesthetic complications: no    Last Vitals:  Vitals:   05/31/18 1630 05/31/18 1644  BP: 137/79 120/75  Pulse: (!) 59 64  Resp: 10 12  Temp: 36.6 C 36.5 C  SpO2: 95% 95%    Last Pain:  Vitals:   05/31/18 1630  TempSrc:   PainSc: 0-No pain                 Catalina Gravel

## 2018-05-31 NOTE — Anesthesia Procedure Notes (Signed)
Procedure Name: LMA Insertion Date/Time: 05/31/2018 3:27 PM Performed by: Claudia Desanctis, CRNA Pre-anesthesia Checklist: Emergency Drugs available, Patient identified, Suction available and Patient being monitored Patient Re-evaluated:Patient Re-evaluated prior to induction Oxygen Delivery Method: Circle system utilized Preoxygenation: Pre-oxygenation with 100% oxygen Induction Type: IV induction LMA: LMA inserted LMA Size: 5.0 Number of attempts: 1 Placement Confirmation: positive ETCO2 and breath sounds checked- equal and bilateral Tube secured with: Tape Dental Injury: Teeth and Oropharynx as per pre-operative assessment

## 2018-05-31 NOTE — H&P (Signed)
Antonio Reyes is an 73 y.o. male.    Chief Complaint: Pre-OP LEFT Ureteroscopic Stone Manipulation  HPI:   1 - Urolithiasis - Prostate Screening - 4.16mm left distal stone with mod hydro (400HU) and 1cm bladder stone by CT on eval dysura late 03/2018. Cr 1.3, UCX negative. Given trial of medical therapy with alpha blocker and passed bladder stone, ureteral stone remains.    PMH sig for HLD, HTN, Ubmil hernia repair with mesh. He works in Control and instrumentation engineer. His PCP is Cathlean Cower MD with [facility].   Today "[name]" is seen to proceed with LEFT ureteroscopic stone manipulation. NO interval fevers.     Past Medical History:  Diagnosis Date  . Abdominal hernia   . Allergic rhinitis   . Anxiety   . Aortic atherosclerosis ([place]) 04/25/2018   noted on CT renal  . Benign neoplasm of stomach   . BPH (benign prostatic hypertrophy)    pt unaware  . C6 cervical fracture ([facility])    C7,C8  . Cancer ([place]) 2000   skin cancer   left ear  . Chronic sinusitis   . CKD (chronic kidney disease), stage III ([place])    pt unaware  . Diverticulitis   . Diverticulosis 03/27/2015   Moderate, Noted on colonoscopy  . Dizziness   . ED (erectile dysfunction)   . GERD (gastroesophageal reflux disease)   . History of herpes genitalis   . History of kidney stones 04/25/2018   4 mm left UVJ stone, 10 mm bladder stone, noted on CT renal  . HLD (hyperlipidemia)   . HTN (hypertension)   . Hydronephrosis    Left, Moderate, noted on CT renal  . Inguinal hernia 04/25/2018   Bilateral, noted on CT renal, pt unaware  . Low back pain   . Migraine    hx of  . Neck pain, bilateral   . Obesity   . Polycythemia 2000   per Dr. Elsworth Soho  . Rotator cuff tear   . Sleep apnea    CPAP  . Tubular adenoma of colon 05/2008    Past Surgical History:  Procedure Laterality Date  . COLONOSCOPY  03/27/2015  . HERNIA REPAIR     umbilical with mesh  . KNEE SURGERY Right    mensicus   2011  . POLYPECTOMY    . UPPER  GI ENDOSCOPY  06/16/2005    Family History  Problem Relation Age of Onset  . Colon cancer Mother 5  . Pancreatic cancer Paternal Grandfather   . Colon polyps Neg Hx   . Esophageal cancer Neg Hx   . Stomach cancer Neg Hx   . Rectal cancer Neg Hx    Social History:  reports that he has been smoking cigars. He has never used smokeless tobacco. He reports current alcohol use of about 1.0 standard drinks of alcohol per week. He reports that he does not use drugs.  Allergies:  Allergies  Allergen Reactions  . [place] Other (See Comments)    No medications prior to admission.    No results found for this or any previous visit (from the past 48 hour(s)). No results found.  Review of Systems  Constitutional: Negative.  Negative for chills and fever.  HENT: Negative.   Eyes: Negative.   Respiratory: Negative.   Cardiovascular: Negative.   Gastrointestinal: Negative.   Genitourinary: Positive for flank pain.  Skin: Negative.   Neurological: Negative.   Endo/Heme/Allergies: Negative.   Psychiatric/Behavioral: Negative.     There  were no vitals taken for this visit. Physical Exam  Constitutional: He appears well-developed.  Eyes: Pupils are equal, round, and reactive to light.  Neck: Normal range of motion.  Cardiovascular: Normal rate.  Respiratory: Effort normal.  GI:  Stable mild obesity.   Genitourinary:    Genitourinary Comments: Very mild left CVAT   Musculoskeletal: Normal range of motion.  Neurological: He is alert.  Skin: Skin is warm.  Psychiatric: He has a normal mood and affect.     Assessment/Plan  Proceed as planned with LEFT ureteroscopic stone manipulation. Risks, benefits, alternatives, expected peri-op course discussed previously and reiterated today.   Alexis Frock, MD 05/31/2018, 8:32 AM

## 2018-05-31 NOTE — Transfer of Care (Signed)
Immediate Anesthesia Transfer of Care Note  Patient: Antonio Reyes  Procedure(s) Performed: CYSTOSCOPY WITH RETROGRADE PYELOGRAM, URETEROSCOPY AND STENT PLACEMENT (Left ) HOLMIUM LASER APPLICATION (Left )  Patient Location: PACU  Anesthesia Type:General  Level of Consciousness: awake, alert , oriented and patient cooperative  Airway & Oxygen Therapy: Patient Spontanous Breathing and Patient connected to face mask  Post-op Assessment: Report given to RN and Post -op Vital signs reviewed and stable  Post vital signs: Reviewed and stable  Last Vitals:  Vitals Value Taken Time  BP    Temp    Pulse    Resp 11 05/31/2018  4:02 PM  SpO2    Vitals shown include unvalidated device data.  Last Pain:  Vitals:   05/31/18 1357  TempSrc:   PainSc: 0-No pain      Patients Stated Pain Goal: 6 (88/50/27 7412)  Complications: No apparent anesthesia complications

## 2018-06-01 NOTE — Op Note (Signed)
NAME: Antonio Reyes, WISSMANN MEDICAL RECORD DP:[id number] ACCOUNT [id number] DATE OF BIRTH:1946/02/20 FACILITY: [facility] LOCATION: [facility] PHYSICIAN:Kasen Adduci, MD  OPERATIVE REPORT  DATE OF PROCEDURE:  05/31/2018  PREOPERATIVE DIAGNOSIS:  Left ureteral stone.  PROCEDURE: 1.  Cystoscopy, left retrograde pyelogram and interpretation. 2.  Left ureteroscopy, basketing of stone.  ESTIMATED BLOOD LOSS:  Nil.  COMPLICATIONS:  None.  SPECIMENS:  Left renal stone for analysis.  FINDINGS: 1.  Unremarkable urinary bladder. 2.  Unremarkable left retrograde pyelogram. 3.  Small left intrarenal stone, likely interval passage of left distal ureteral stone.  INDICATIONS:  The patient is a very pleasant 73 year old gentleman who was found on workup for colicky flank pain to have a left distal ureteral stone at 4 mm and also a small bladder stone.  He has since passed the small bladder stone, but has had  ongoing on and off colic symptoms and does not definitively report passage of his left distal ureteral stone despite straining of the urine.    Options were discussed for management including continued medical therapy versus shockwave lithotripsy versus ureteroscopy and after a month on medical therapy, he has elected to undergo left-sided ureteroscopy with goal of stone free.  He presents for  this today.  Informed consent was obtained and placed in medical record.  DESCRIPTION OF PROCEDURE:  Patient being Lyon Dumont verified,  procedure being left ureteroscopic stimulation was confirmed.  Procedure timeout was performed.  Antibiotics administered.  General LMA anesthesia induced.  The patient was placed into a  low lithotomy position.  Sterile field was created prepping and draping the base of the penis, perineum and proximal thighs using iodine.  Cystourethroscopy was performed using a 20 French rigid cystoscope with vessel lens.  Inspection of anterior and  posterior urethra were unremarkable.   Inspection of bladder revealed no diverticula, calcifications or papillary lesions.  Ureteral orifices were singleton.  The left ureteral orifice was cannulated with a 6 [place] renal catheter and left retrograde  pyelogram was obtained.  Left retrograde pyelogram demonstrated a single left ureter, single system left kidney.  There was no obvious hydronephrosis, filling defects or narrowing.  This was different than his prior hydronephrosis on CT scan.  This was felt to possibly represent  interval passage of the stone; however, given the goal today was to verify stone free.  A ZIPwire was advanced in the lower renal pole and set aside as a safety wire.  An 8-French feeding tube was placed in the urinary bladder for pressure release, and  semirigid ureteroscopy was performed of the distal 2/3 left ureter alongside a separate sensor working wire.  There are no calcifications seen.  There was some mild mucosal edema in the distal fourth of the ureter, likely consistent with prior site of  stone impaction.  There was no obvious intraluminal stone at this location.  The semirigid scope was then exchanged over the sensor working wire for a dual channel flexible digital ureteroscope to the level of the kidney using continuous fluoroscopic  guidance and flexible digital ureteroscopy was performed of the left kidney, including all calices x3.  There was a small upper pole intraluminal calcification.  This was felt to be only 1-2 mm and likely not the prior ureteral stone.  However, this was  amenable to simple basketing and the only calcification noted.  This was basketed with the escape basket which was then navigated out with continued ureteroscopy of the entire length of the left ureter.  Once again, there were  again no additional  calcifications or mucosal abnormalities noted.  The stone was set aside for analysis.  The safety wire removed and the procedure terminated.    The patient tolerated the procedure  well.  No immediate complications.    The patient was taken to [facility] in stable condition with plan for discharge home.  AN/NUANCE  D:05/31/2018 T:06/01/2018 JOB:[id number]

## 2018-06-06 ENCOUNTER — Encounter: Payer: BLUE CROSS/BLUE SHIELD | Admitting: Gastroenterology

## 2018-06-07 ENCOUNTER — Encounter (HOSPITAL_COMMUNITY): Payer: Self-pay | Admitting: Urology

## 2018-06-10 ENCOUNTER — Other Ambulatory Visit: Payer: Self-pay | Admitting: Internal Medicine

## 2018-06-16 ENCOUNTER — Other Ambulatory Visit: Payer: Self-pay | Admitting: Internal Medicine

## 2018-06-21 ENCOUNTER — Telehealth: Payer: Self-pay | Admitting: *Deleted

## 2018-06-21 NOTE — Telephone Encounter (Signed)
Spoke with patient.  Advised him that his procedure for 4/6 is cancelled d/t covid.  Informed him that we would call back to reschedule.

## 2018-06-26 ENCOUNTER — Encounter: Payer: BLUE CROSS/BLUE SHIELD | Admitting: Gastroenterology

## 2018-07-24 ENCOUNTER — Telehealth: Payer: Self-pay | Admitting: *Deleted

## 2018-07-24 NOTE — Telephone Encounter (Signed)
Called patient to reschedule previously canceled coloscopy due to Covid-19. Rescheduled to 08/07/18 at 130 pm. Patient has prep. Reviewed care partner restrictions and advised if patient has a mask to bring and wear into the building. Pt did inquire if temperatures are high, what are care partners to do? Awaiting answer.

## 2018-07-31 ENCOUNTER — Encounter: Payer: Self-pay | Admitting: *Deleted

## 2018-07-31 NOTE — Telephone Encounter (Signed)
Erroneous encounter

## 2018-08-02 ENCOUNTER — Telehealth: Payer: Self-pay

## 2018-08-02 MED ORDER — METOPROLOL SUCCINATE ER 50 MG PO TB24: ORAL_TABLET | ORAL | 1 refills | Status: DC

## 2018-08-02 NOTE — Telephone Encounter (Signed)
Copied from [place] [id number]. Topic: Quick Communication - Rx Refill/Question >> Aug 02, 2018 11:28 AM Oneta Rack wrote: Medication: metoprolol succinate (TOPROL-XL) 50 MG 24 hr tablet    Preferred Pharmacy (with phone number or street name):   [place], [facility] [address]    Agent: Please be advised that RX refills may take up to 3 business days. We ask that you follow-up with your pharmacy.

## 2018-08-04 ENCOUNTER — Telehealth: Payer: Self-pay | Admitting: *Deleted

## 2018-08-04 NOTE — Telephone Encounter (Signed)
LMOM to go over covid screening questions

## 2018-08-04 NOTE — Telephone Encounter (Signed)
Pt is returning your call and said he is confirming his appt for Monday.  He said "I have answered all the questions and I am not running a temp or been out of the [place]

## 2018-08-04 NOTE — Telephone Encounter (Signed)
Pt's return call noted

## 2018-08-07 ENCOUNTER — Ambulatory Visit (AMBULATORY_SURGERY_CENTER): Payer: Medicare Other | Admitting: Gastroenterology

## 2018-08-07 ENCOUNTER — Encounter: Payer: Self-pay | Admitting: Gastroenterology

## 2018-08-07 ENCOUNTER — Other Ambulatory Visit: Payer: Self-pay

## 2018-08-07 VITALS — BP 138/75 | HR 53 | Temp 98.6°F | Resp 15 | Ht 76.0 in | Wt 254.0 lb

## 2018-08-07 DIAGNOSIS — Z800 Family history of malignant neoplasm of digestive organs: Secondary | ICD-10-CM

## 2018-08-07 DIAGNOSIS — D124 Benign neoplasm of descending colon: Secondary | ICD-10-CM

## 2018-08-07 DIAGNOSIS — D123 Benign neoplasm of transverse colon: Secondary | ICD-10-CM | POA: Diagnosis not present

## 2018-08-07 DIAGNOSIS — Z86010 Personal history of colonic polyps: Secondary | ICD-10-CM

## 2018-08-07 DIAGNOSIS — Z860101 Personal history of adenomatous and serrated colon polyps: Secondary | ICD-10-CM

## 2018-08-07 DIAGNOSIS — D122 Benign neoplasm of ascending colon: Secondary | ICD-10-CM | POA: Diagnosis not present

## 2018-08-07 DIAGNOSIS — Z1211 Encounter for screening for malignant neoplasm of colon: Secondary | ICD-10-CM | POA: Diagnosis not present

## 2018-08-07 DIAGNOSIS — D120 Benign neoplasm of cecum: Secondary | ICD-10-CM | POA: Diagnosis not present

## 2018-08-07 MED ORDER — SODIUM CHLORIDE 0.9 % IV SOLN: 500.0000 mL | Freq: Once | INTRAVENOUS | Status: DC

## 2018-08-07 NOTE — Progress Notes (Signed)
Called to room to assist during endoscopic procedure.  Patient ID and intended procedure confirmed with present staff. Received instructions for my participation in the procedure from the performing physician.  

## 2018-08-07 NOTE — Op Note (Addendum)
[place] Patient Name: Antonio Reyes Procedure Date: 08/07/2018 1:29 PM MRN: [id number] Endoscopist: Ladene Artist , MD Age: 73 Referring MD:  Date of Birth: [birth year] Gender: Male Account #: [id number] Procedure:                Colonoscopy Indications:              Surveillance: Personal history of adenomatous                            polyps on last colonoscopy 3 years ago. Family                            history of colon cancer, first degree relative. Medicines:                Monitored Anesthesia Care Procedure:                Pre-Anesthesia Assessment:                           - Prior to the procedure, a History and Physical                            was performed, and patient medications and                            allergies were reviewed. The patient's tolerance of                            previous anesthesia was also reviewed. The risks                            and benefits of the procedure and the sedation                            options and risks were discussed with the patient.                            All questions were answered, and informed consent                            was obtained. Prior Anticoagulants: The patient has                            taken no previous anticoagulant or antiplatelet                            agents. ASA Grade Assessment: II - A patient with                            mild systemic disease. After reviewing the risks                            and benefits, the patient was deemed in  satisfactory condition to undergo the procedure.                           After obtaining informed consent, the colonoscope                            was passed under direct vision. Throughout the                            procedure, the patient's blood pressure, pulse, and                            oxygen saturations were monitored continuously. The                            Colonoscope was  introduced through the anus and                            advanced to the the cecum, identified by                            appendiceal orifice and ileocecal valve. The                            ileocecal valve, appendiceal orifice, and rectum                            were photographed. The quality of the bowel                            preparation was adequate. The colonoscopy was                            performed without difficulty. The patient tolerated                            the procedure well. Scope In: 1:36:47 PM Scope Out: 1:56:03 PM Scope Withdrawal Time: 0 hours 17 minutes 9 seconds  Total Procedure Duration: 0 hours 19 minutes 16 seconds  Findings:                 The perianal and digital rectal examinations were                            normal.                           A 4 mm polyp was found in the cecum. The polyp was                            sessile. The polyp was removed with a cold biopsy                            forceps. Resection and retrieval were complete.  Five sessile polyps were found in the descending                            colon (1), transverse colon (3) and ascending colon                            (1). The polyps were 5 to 8 mm in size. These                            polyps were removed with a cold snare. Resection                            and retrieval were complete.                           Multiple medium-mouthed diverticula were found in                            the sigmoid colon, descending colon and transverse                            colon. There was no evidence of diverticular                            bleeding.                           Internal hemorrhoids were found during                            retroflexion. The hemorrhoids were moderate and                            Grade I (internal hemorrhoids that do not prolapse).                           The exam was otherwise without  abnormality on                            direct and retroflexion views. Complications:            No immediate complications. Estimated blood loss:                            None. Estimated Blood Loss:     Estimated blood loss: none. Impression:               - One 4 mm polyp in the cecum, removed with a cold                            biopsy forceps. Resected and retrieved.                           - Five 5 to 8 mm polyps in the descending colon, in  the transverse colon and in the ascending colon,                            removed with a cold snare. Resected and retrieved.                           - Moderate diverticulosis in the sigmoid colon, in                            the descending colon and in the transverse colon.                           - Internal hemorrhoids.                           - The examination was otherwise normal on direct                            and retroflexion views. Recommendation:           - Repeat colonoscopy in 3 years for surveillance.                           - Patient has a contact number available for                            emergencies. The signs and symptoms of potential                            delayed complications were discussed with the                            patient. Return to normal activities tomorrow.                            Written discharge instructions were provided to the                            patient.                           - High fiber diet.                           - Continue present medications.                           - Await pathology results.                           - Consider hemorrhoidal banding if symptoms persist. Ladene Artist, MD 08/07/2018 2:01:49 PM This report has been signed electronically.

## 2018-08-07 NOTE — Patient Instructions (Signed)
  Thank you for allowing [place] to care for you today!  Await pathology results by mail, approximately 2 weeks.  Recommend next colonoscopy in 3 years.  Resume previous diet and medications today.  Return to normal activities tomorrow.    YOU HAD AN ENDOSCOPIC PROCEDURE TODAY AT [facility]:   Refer to the procedure report that was given to you for any specific questions about what was found during the examination.  If the procedure report does not answer your questions, please call your gastroenterologist to clarify.  If you requested that your care partner not be given the details of your procedure findings, then the procedure report has been included in a sealed envelope for you to review at your convenience later.  YOU SHOULD EXPECT: Some feelings of bloating in the abdomen. Passage of more gas than usual.  Walking can help get rid of the air that was put into your GI tract during the procedure and reduce the bloating. If you had a lower endoscopy (such as a colonoscopy or flexible sigmoidoscopy) you may notice spotting of blood in your stool or on the toilet paper. If you underwent a bowel prep for your procedure, you may not have a normal bowel movement for a few days.  Please Note:  You might notice some irritation and congestion in your nose or some drainage.  This is from the oxygen used during your procedure.  There is no need for concern and it should clear up in a day or so.  SYMPTOMS TO REPORT IMMEDIATELY:   Following lower endoscopy (colonoscopy or flexible sigmoidoscopy):  Excessive amounts of blood in the stool  Significant tenderness or worsening of abdominal pains  Swelling of the abdomen that is new, acute  Fever of 100F or higher   For urgent or emergent issues, a gastroenterologist can be reached at any hour by calling [phone].   DIET:  We do recommend a small meal at first, but then you may proceed to your regular diet.  Drink plenty of fluids but  you should avoid alcoholic beverages for 24 hours.  ACTIVITY:  You should plan to take it easy for the rest of today and you should NOT DRIVE or use heavy machinery until tomorrow (because of the sedation medicines used during the test).    FOLLOW UP: Our staff will call the number listed on your records 48-72 hours following your procedure to check on you and address any questions or concerns that you may have regarding the information given to you following your procedure. If we do not reach you, we will leave a message.  We will attempt to reach you two times.  During this call, we will ask if you have developed any symptoms of COVID 19. If you develop any symptoms (for example fever, flu-like symptoms, shortness of breath, cough etc.) before then, please call [phone].  If any biopsies were taken you will be contacted by phone or by letter within the next 1-3 weeks.  Please call us at [phone] if you have not heard about the biopsies in 3 weeks.    SIGNATURES/CONFIDENTIALITY: You and/or your care partner have signed paperwork which will be entered into your electronic medical record.  These signatures attest to the fact that that the information above on your After Visit Summary has been reviewed and is understood.  Full responsibility of the confidentiality of this discharge information lies with you and/or your care-partner.

## 2018-08-07 NOTE — Progress Notes (Signed)
To Pacu, vss. Report to [email or web address].tb ?

## 2018-08-08 MED ORDER — METOPROLOL SUCCINATE ER 50 MG PO TB24: ORAL_TABLET | ORAL | 1 refills | Status: DC

## 2018-08-08 NOTE — Addendum Note (Signed)
Addended by: [name] on: 08/08/2018 12:45 PM   Modules accepted: Orders

## 2018-08-08 NOTE — Telephone Encounter (Signed)
Done erx 

## 2018-08-08 NOTE — Telephone Encounter (Signed)
Cecille Rubin with [facility] called stating they need clarification on the SIG for metoprolol. There are no dosing instructions. Please call or send new RX.  Lori with [facility], [place] - [facility] [phone]

## 2018-08-09 ENCOUNTER — Telehealth: Payer: Self-pay

## 2018-08-09 NOTE — Telephone Encounter (Signed)
  Follow up Call-  Call back number 08/07/2018  Post procedure Call Back phone  # [phone]- cell  Permission to leave phone message Yes  Some recent data might be hidden     Patient questions:  Do you have a fever, pain , or abdominal swelling? No. Pain Score  0 *  Have you tolerated food without any problems? Yes.    Have you been able to return to your normal activities? Yes.    Do you have any questions about your discharge instructions: Diet   No. Medications  No. Follow up visit  No.  Do you have questions or concerns about your Care? No.  Actions: * If pain score is 4 or above: No action needed, pain <4.  1. Have you developed a fever since your procedure? no  2.   Have you had an respiratory symptoms (SOB or cough) since your procedure? no  3.   Have you tested positive for COVID 19 since your procedure no  3.   Have you had any family members/close contacts diagnosed with the COVID 19 since your procedure?  no   If any of these questions are a yes, please inquire if patient has been seen by family doctor and route this note to Joylene John, Therapist, sports.

## 2018-08-11 ENCOUNTER — Telehealth: Payer: Self-pay | Admitting: Internal Medicine

## 2018-08-11 MED ORDER — FLUTICASONE PROPIONATE 50 MCG/ACT NA SUSP: NASAL | 1 refills | Status: DC

## 2018-08-11 MED ORDER — MONTELUKAST SODIUM 10 MG PO TABS: ORAL_TABLET | ORAL | 1 refills | Status: DC

## 2018-08-11 MED ORDER — ATORVASTATIN CALCIUM 40 MG PO TABS: ORAL_TABLET | ORAL | 1 refills | Status: DC

## 2018-08-11 NOTE — Telephone Encounter (Signed)
Copied from [place] [id number]. Topic: Quick Communication - Rx Refill/Question >> Aug 11, 2018 12:49 PM Carolyn Stare wrote: Pt has a different pharmacy and need the below meds called in   Medication    atorvastatin (LIPITOR) 40 MG tablet   fluticasone (FLONASE) 50 MCG/ACT nasal spray     montelukast (SINGULAIR) 10 MG tab  Preferred Pharmacy [facility]   Agent: Please be advised that RX refills may take up to 3 business days. We ask that you follow-up with your pharmacy.

## 2018-08-15 ENCOUNTER — Encounter: Payer: Self-pay | Admitting: Gastroenterology

## 2018-08-22 ENCOUNTER — Telehealth: Payer: Self-pay | Admitting: Gastroenterology

## 2018-08-22 NOTE — Telephone Encounter (Signed)
Reviewed the letter with the patient all questions answered.  He will call back with any additional questions or concerns.

## 2018-08-22 NOTE — Telephone Encounter (Signed)
Patient called would like to know the pathology results from his colonoscopy that was on 08-07-18

## 2018-09-18 DIAGNOSIS — L814 Other melanin hyperpigmentation: Secondary | ICD-10-CM | POA: Diagnosis not present

## 2018-09-18 DIAGNOSIS — L708 Other acne: Secondary | ICD-10-CM | POA: Diagnosis not present

## 2018-09-18 DIAGNOSIS — L819 Disorder of pigmentation, unspecified: Secondary | ICD-10-CM | POA: Diagnosis not present

## 2018-09-18 DIAGNOSIS — D1801 Hemangioma of skin and subcutaneous tissue: Secondary | ICD-10-CM | POA: Diagnosis not present

## 2018-09-18 DIAGNOSIS — L812 Freckles: Secondary | ICD-10-CM | POA: Diagnosis not present

## 2018-09-18 DIAGNOSIS — D229 Melanocytic nevi, unspecified: Secondary | ICD-10-CM | POA: Diagnosis not present

## 2018-09-18 DIAGNOSIS — L720 Epidermal cyst: Secondary | ICD-10-CM | POA: Diagnosis not present

## 2018-09-18 DIAGNOSIS — L821 Other seborrheic keratosis: Secondary | ICD-10-CM | POA: Diagnosis not present

## 2018-10-24 ENCOUNTER — Other Ambulatory Visit: Payer: Self-pay

## 2018-10-24 ENCOUNTER — Encounter: Payer: Self-pay | Admitting: Internal Medicine

## 2018-10-24 ENCOUNTER — Ambulatory Visit (INDEPENDENT_AMBULATORY_CARE_PROVIDER_SITE_OTHER): Payer: Medicare Other | Admitting: Internal Medicine

## 2018-10-24 DIAGNOSIS — I10 Essential (primary) hypertension: Secondary | ICD-10-CM | POA: Diagnosis not present

## 2018-10-24 DIAGNOSIS — R739 Hyperglycemia, unspecified: Secondary | ICD-10-CM | POA: Diagnosis not present

## 2018-10-24 DIAGNOSIS — E7800 Pure hypercholesterolemia, unspecified: Secondary | ICD-10-CM

## 2018-10-24 LAB — POCT GLYCOSYLATED HEMOGLOBIN (HGB A1C): Hemoglobin A1C: 5.5 % (ref 4.0–5.6)

## 2018-10-24 NOTE — Assessment & Plan Note (Signed)
stable overall by history and exam, recent data reviewed with pt, and pt to continue medical treatment as before,  to f/u any worsening symptoms or concerns  

## 2018-10-24 NOTE — Progress Notes (Addendum)
Subjective:    Patient ID: Antonio Reyes, male    DOB: April 07, 1945, 73 y.o.   MRN: [id number]  HPI   Here to f/u; overall doing ok,  Pt denies chest pain, increasing sob or doe, wheezing, orthopnea, PND, increased LE swelling, palpitations, dizziness or syncope.  Pt denies new neurological symptoms such as new headache, or facial or extremity weakness or numbness.  Pt denies polydipsia, polyuria, or low sugar episode.  Pt states overall good compliance with meds, mostly trying to follow appropriate diet, with wt overall stable,  but little exercise however. S/p left renal stone removal per Urology; no new complaints Past Medical History:  Diagnosis Date  . Abdominal hernia   . Allergic rhinitis   . Anxiety    pt states no history of anxiety  . Aortic atherosclerosis ([place]) 04/25/2018   noted on CT renal  . Benign neoplasm of stomach   . BPH (benign prostatic hypertrophy)    pt unaware  . C6 cervical fracture (HCC)    C7,C8  . Cancer ([place]) 2000   skin cancer   left ear  . Chronic sinusitis   . CKD (chronic kidney disease), stage III ([place])    pt unaware  . Diverticulitis   . Diverticulosis 03/27/2015   Moderate, Noted on colonoscopy  . Dizziness   . ED (erectile dysfunction)   . GERD (gastroesophageal reflux disease)   . History of herpes genitalis   . History of kidney stones 04/25/2018   4 mm left UVJ stone, 10 mm bladder stone, noted on CT renal  . HLD (hyperlipidemia)   . HTN (hypertension)   . Hydronephrosis    Left, Moderate, noted on CT renal  . Inguinal hernia 04/25/2018   Bilateral, noted on CT renal, pt unaware  . Low back pain   . Migraine    hx of  . Neck pain, bilateral   . Obesity   . Polycythemia 2000   per Dr. Elsworth Soho  . Rotator cuff tear   . Sleep apnea    CPAP  . Tubular adenoma of colon 05/2008   Past Surgical History:  Procedure Laterality Date  . COLONOSCOPY  03/27/2015  . CYSTOSCOPY WITH RETROGRADE PYELOGRAM, URETEROSCOPY AND STENT PLACEMENT Left  05/31/2018   Procedure: CYSTOSCOPY WITH RETROGRADE PYELOGRAM, URETEROSCOPY AND STENT PLACEMENT;  Surgeon: Alexis Frock, MD;  Location: [facility];  Service: Urology;  Laterality: Left;  1 HR  . HERNIA REPAIR     umbilical with mesh  . HOLMIUM LASER APPLICATION Left 4/68/0321   Procedure: HOLMIUM LASER APPLICATION;  Surgeon: Alexis Frock, MD;  Location: [facility];  Service: Urology;  Laterality: Left;  . KNEE SURGERY Right    mensicus   2011  . POLYPECTOMY    . UPPER GI ENDOSCOPY  06/16/2005    reports that he has been smoking cigars. He has never used smokeless tobacco. He reports current alcohol use of about 1.0 standard drinks of alcohol per week. He reports that he does not use drugs. family history includes Colon cancer (age of onset: 63) in his mother; Pancreatic cancer in his paternal grandfather. Allergies  Allergen Reactions  . Olive Tree Other (See Comments)   Current Outpatient Medications on File Prior to Visit  Medication Sig Dispense Refill  . aspirin 81 MG tablet Take 81 mg by mouth daily.      Marland Kitchen atorvastatin (LIPITOR) 40 MG tablet TAKE 1 TABLET BY MOUTH DAILY GENERIC EQUIVALENT FOR LIPITOR 90 tablet 1  . fluticasone (  FLONASE) 50 MCG/ACT nasal spray USE 2 SPRAYS IN EACH NOSTRIL DAILY GENERIC EQUIVALENT FOR FLONASE 48 g 1  . ibuprofen (ADVIL,MOTRIN) 200 MG tablet Take 200 mg by mouth every 6 (six) hours as needed for headache or moderate pain.    . metoprolol succinate (TOPROL-XL) 50 MG 24 hr tablet 1 tab by mouth daily 90 tablet 1  . montelukast (SINGULAIR) 10 MG tablet TAKE 1 TABLET BY MOUTH DAILY GENERIC EQUIVALENT FOR SINGULAIR 90 tablet 1   Current Facility-Administered Medications on File Prior to Visit  Medication Dose Route Frequency Provider Last Rate Last Dose  . gentamicin (GARAMYCIN) 500 mg in dextrose 5 % 100 mL IVPB  500 mg Intravenous Once Alexis Frock, MD       Review of Systems  Constitutional: Negative for other unusual diaphoresis or sweats HENT:  Negative for ear discharge or swelling Eyes: Negative for other worsening visual disturbances Respiratory: Negative for stridor or other swelling  Gastrointestinal: Negative for worsening distension or other blood Genitourinary: Negative for retention or other urinary change Musculoskeletal: Negative for other MSK pain or swelling Skin: Negative for color change or other new lesions Neurological: Negative for worsening tremors and other numbness  Psychiatric/Behavioral: Negative for worsening agitation or other fatigue All other system neg per pt    Objective:   Physical Exam BP 122/78   Pulse (!) 51   Temp 97.9 F (36.6 C) (Oral)   Resp 15   Ht 6\' 4"  (1.93 m)   Wt 271 lb (122.9 kg)   SpO2 98%   BMI 32.99 kg/m  VS noted,  Constitutional: Pt appears in NAD HENT: Head: NCAT.  Right Ear: External ear normal.  Left Ear: External ear normal.  Eyes: . Pupils are equal, round, and reactive to light. Conjunctivae and EOM are normal Nose: without d/c or deformity Neck: Neck supple. Gross normal ROM Cardiovascular: Normal rate and regular rhythm.   Pulmonary/Chest: Effort normal and breath sounds without rales or wheezing.  Abd:  Soft, NT, ND, + BS, no organomegaly Neurological: Pt is alert. At baseline orientation, motor grossly intact Skin: Skin is warm. No rashes, other new lesions, no LE edema Psychiatric: Pt behavior is normal without agitation  No other exam findings Lab Results  Component Value Date   WBC 7.0 05/26/2018   HGB 16.9 05/26/2018   HCT 52.7 (H) 05/26/2018   PLT 246 05/26/2018   GLUCOSE 115 (H) 05/26/2018   CHOL 105 04/18/2018   TRIG 83.0 04/18/2018   HDL 35.30 (L) 04/18/2018   LDLDIRECT 148.7 04/12/2008   LDLCALC 53 04/18/2018   ALT 25 04/18/2018   AST 18 04/18/2018   NA 139 05/26/2018   K 4.1 05/26/2018   CL 107 05/26/2018   CREATININE 1.29 (H) 05/26/2018   BUN 21 05/26/2018   CO2 22 05/26/2018   TSH 2.78 04/18/2018   PSA 0.77 04/18/2018   HGBA1C  5.7 04/18/2018  POCT HgB A1C Order: [id number] Status:  Final result Visible to patient:  No (not released) Dx:  HYPERCHOLESTEROLEMIA; Hyperglycemia; ...  Ref Range & Units 09:38 24mo ago 73yr ago 49yr ago 48yr ago  Hemoglobin A1C 4.0 - 5.6 % 5.5  5.7 R, CM  6.0 R, CM  5.9 R, CM  6.0 R,           Assessment & Plan:

## 2018-10-24 NOTE — Addendum Note (Signed)
Addended by: Terence Lux B on: 10/24/2018 09:39 AM   Modules accepted: Orders

## 2018-10-24 NOTE — Patient Instructions (Addendum)
Your A1c was normal today  Please continue all other medications as before, and refills have been done if requested.  Please have the pharmacy call with any other refills you may need.  Please continue your efforts at being more active, low cholesterol diet, and weight control.  You are otherwise up to date with prevention measures today.  Please keep your appointments with your specialists as you may have planned  Please return in 6 months, or sooner if needed

## 2018-10-24 NOTE — Assessment & Plan Note (Signed)
stable overall by history and exam, recent data reviewed with pt, and pt to continue medical treatment as before,  to f/u any worsening symptoms or concerns. For a1c today POCT

## 2018-12-21 ENCOUNTER — Other Ambulatory Visit: Payer: Self-pay | Admitting: Internal Medicine

## 2018-12-23 DIAGNOSIS — Z23 Encounter for immunization: Secondary | ICD-10-CM | POA: Diagnosis not present

## 2019-04-16 ENCOUNTER — Telehealth: Payer: Self-pay | Admitting: Internal Medicine

## 2019-04-16 NOTE — Telephone Encounter (Signed)
    Patient requesting order for 6 month labs

## 2019-04-16 NOTE — Telephone Encounter (Signed)
Sorry, am unable as medicare is primary insurance and will not pay for lab work prior to visit

## 2019-04-19 ENCOUNTER — Other Ambulatory Visit: Payer: Medicare Other

## 2019-04-26 ENCOUNTER — Encounter: Payer: Self-pay | Admitting: Internal Medicine

## 2019-04-26 ENCOUNTER — Other Ambulatory Visit: Payer: Self-pay

## 2019-04-26 ENCOUNTER — Ambulatory Visit (INDEPENDENT_AMBULATORY_CARE_PROVIDER_SITE_OTHER): Payer: Medicare Other | Admitting: Internal Medicine

## 2019-04-26 VITALS — BP 130/80 | HR 56 | Temp 97.9°F | Ht 76.0 in | Wt 283.0 lb

## 2019-04-26 DIAGNOSIS — E538 Deficiency of other specified B group vitamins: Secondary | ICD-10-CM | POA: Diagnosis not present

## 2019-04-26 DIAGNOSIS — E611 Iron deficiency: Secondary | ICD-10-CM

## 2019-04-26 DIAGNOSIS — I10 Essential (primary) hypertension: Secondary | ICD-10-CM | POA: Diagnosis not present

## 2019-04-26 DIAGNOSIS — R739 Hyperglycemia, unspecified: Secondary | ICD-10-CM

## 2019-04-26 DIAGNOSIS — E7800 Pure hypercholesterolemia, unspecified: Secondary | ICD-10-CM

## 2019-04-26 DIAGNOSIS — E559 Vitamin D deficiency, unspecified: Secondary | ICD-10-CM | POA: Diagnosis not present

## 2019-04-26 DIAGNOSIS — N183 Chronic kidney disease, stage 3 unspecified: Secondary | ICD-10-CM | POA: Diagnosis not present

## 2019-04-26 DIAGNOSIS — N320 Bladder-neck obstruction: Secondary | ICD-10-CM

## 2019-04-26 LAB — CBC WITH DIFFERENTIAL/PLATELET
Basophils Absolute: 0.1 10*3/uL (ref 0.0–0.1)
Basophils Relative: 0.7 % (ref 0.0–3.0)
Eosinophils Absolute: 0.1 10*3/uL (ref 0.0–0.7)
Eosinophils Relative: 1.8 % (ref 0.0–5.0)
HCT: 51.5 % (ref 39.0–52.0)
Hemoglobin: 17.5 g/dL — ABNORMAL HIGH (ref 13.0–17.0)
Lymphocytes Relative: 28.9 % (ref 12.0–46.0)
Lymphs Abs: 2.2 10*3/uL (ref 0.7–4.0)
MCHC: 33.9 g/dL (ref 30.0–36.0)
MCV: 91.4 fl (ref 78.0–100.0)
Monocytes Absolute: 0.5 10*3/uL (ref 0.1–1.0)
Monocytes Relative: 6.9 % (ref 3.0–12.0)
Neutro Abs: 4.7 10*3/uL (ref 1.4–7.7)
Neutrophils Relative %: 61.7 % (ref 43.0–77.0)
Platelets: 225 10*3/uL (ref 150.0–400.0)
RBC: 5.63 Mil/uL (ref 4.22–5.81)
RDW: 13.6 % (ref 11.5–15.5)
WBC: 7.6 10*3/uL (ref 4.0–10.5)

## 2019-04-26 LAB — URINALYSIS, ROUTINE W REFLEX MICROSCOPIC
Bilirubin Urine: NEGATIVE
Hgb urine dipstick: NEGATIVE
Ketones, ur: NEGATIVE
Leukocytes,Ua: NEGATIVE
Nitrite: NEGATIVE
RBC / HPF: NONE SEEN (ref 0–?)
Specific Gravity, Urine: 1.025 (ref 1.000–1.030)
Total Protein, Urine: NEGATIVE
Urine Glucose: NEGATIVE
Urobilinogen, UA: 0.2 (ref 0.0–1.0)
WBC, UA: NONE SEEN (ref 0–?)
pH: 6 (ref 5.0–8.0)

## 2019-04-26 LAB — VITAMIN B12: Vitamin B-12: 254 pg/mL (ref 211–911)

## 2019-04-26 LAB — HEPATIC FUNCTION PANEL
ALT: 27 U/L (ref 0–53)
AST: 17 U/L (ref 0–37)
Albumin: 4.4 g/dL (ref 3.5–5.2)
Alkaline Phosphatase: 87 U/L (ref 39–117)
Bilirubin, Direct: 0.2 mg/dL (ref 0.0–0.3)
Total Bilirubin: 0.9 mg/dL (ref 0.2–1.2)
Total Protein: 6.9 g/dL (ref 6.0–8.3)

## 2019-04-26 LAB — IBC PANEL
Iron: 107 ug/dL (ref 42–165)
Saturation Ratios: 39.2 % (ref 20.0–50.0)
Transferrin: 195 mg/dL — ABNORMAL LOW (ref 212.0–360.0)

## 2019-04-26 LAB — BASIC METABOLIC PANEL
BUN: 17 mg/dL (ref 6–23)
CO2: 26 mEq/L (ref 19–32)
Calcium: 9.4 mg/dL (ref 8.4–10.5)
Chloride: 105 mEq/L (ref 96–112)
Creatinine, Ser: 1.24 mg/dL (ref 0.40–1.50)
GFR: 57.36 mL/min — ABNORMAL LOW (ref >60.00)
Glucose, Bld: 110 mg/dL — ABNORMAL HIGH (ref 70–99)
Potassium: 4.4 mEq/L (ref 3.5–5.1)
Sodium: 139 mEq/L (ref 135–145)

## 2019-04-26 LAB — LIPID PANEL
Cholesterol: 114 mg/dL (ref 0–200)
HDL: 37.4 mg/dL — ABNORMAL LOW (ref >39.00)
LDL Cholesterol: 56 mg/dL (ref 0–99)
NonHDL: 77.08
Total CHOL/HDL Ratio: 3
Triglycerides: 105 mg/dL (ref 0.0–149.0)
VLDL: 21 mg/dL (ref 0.0–40.0)

## 2019-04-26 LAB — TSH: TSH: 2.61 u[IU]/mL (ref 0.35–4.50)

## 2019-04-26 LAB — HEMOGLOBIN A1C: Hgb A1c MFr Bld: 5.8 % (ref 4.6–6.5)

## 2019-04-26 LAB — PSA: PSA: 0.91 ng/mL (ref 0.10–4.00)

## 2019-04-26 LAB — VITAMIN D 25 HYDROXY (VIT D DEFICIENCY, FRACTURES): VITD: 21.75 ng/mL — ABNORMAL LOW (ref 30.00–100.00)

## 2019-04-26 MED ORDER — FLUTICASONE PROPIONATE 50 MCG/ACT NA SUSP: NASAL | 3 refills | Status: DC

## 2019-04-26 MED ORDER — MONTELUKAST SODIUM 10 MG PO TABS: ORAL_TABLET | ORAL | 3 refills | Status: DC

## 2019-04-26 MED ORDER — ATORVASTATIN CALCIUM 40 MG PO TABS: ORAL_TABLET | ORAL | 3 refills | Status: DC

## 2019-04-26 MED ORDER — METOPROLOL SUCCINATE ER 50 MG PO TB24: ORAL_TABLET | ORAL | 3 refills | Status: DC

## 2019-04-26 NOTE — Patient Instructions (Signed)

## 2019-04-26 NOTE — Assessment & Plan Note (Signed)
stable overall by history and exam, recent data reviewed with pt, and pt to continue medical treatment as before,  to f/u any worsening symptoms or concerns  

## 2019-04-26 NOTE — Progress Notes (Signed)
Subjective:    Patient ID: Antonio Reyes, male    DOB: 1945/04/24, 74 y.o.   MRN: [id number]  HPI  Here for yearly f/u;  Overall doing ok;  Pt denies Chest pain, worsening SOB, DOE, wheezing, orthopnea, PND, worsening LE edema, palpitations, dizziness or syncope.  Pt denies neurological change such as new headache, facial or extremity weakness.  Pt denies polydipsia, polyuria, or low sugar symptoms. Pt states overall good compliance with treatment and medications, good tolerability, and has been trying to follow appropriate diet.  Pt denies worsening depressive symptoms, suicidal ideation or panic. No fever, night sweats, wt loss, loss of appetite, or other constitutional symptoms.  Pt states good ability with ADL's, has low fall risk, home safety reviewed and adequate, no other significant changes in hearing or vision, and only occasionally active with exercise.  Due for COVID #1 later this wk. No new complaints Past Medical History:  Diagnosis Date  . Abdominal hernia   . Allergic rhinitis   . Anxiety    pt states no history of anxiety  . Aortic atherosclerosis ([place]) 04/25/2018   noted on CT renal  . Benign neoplasm of stomach   . BPH (benign prostatic hypertrophy)    pt unaware  . C6 cervical fracture ([facility])    C7,C8  . Cancer ([place]) 2000   skin cancer   left ear  . Chronic sinusitis   . CKD (chronic kidney disease), stage III    pt unaware  . Diverticulitis   . Diverticulosis 03/27/2015   Moderate, Noted on colonoscopy  . Dizziness   . ED (erectile dysfunction)   . GERD (gastroesophageal reflux disease)   . History of herpes genitalis   . History of kidney stones 04/25/2018   4 mm left UVJ stone, 10 mm bladder stone, noted on CT renal  . HLD (hyperlipidemia)   . HTN (hypertension)   . Hydronephrosis    Left, Moderate, noted on CT renal  . Inguinal hernia 04/25/2018   Bilateral, noted on CT renal, pt unaware  . Low back pain   . Migraine    hx of  . Neck pain, bilateral    . Obesity   . Polycythemia 2000   per Dr. Elsworth Soho  . Rotator cuff tear   . Sleep apnea    CPAP  . Tubular adenoma of colon 05/2008   Past Surgical History:  Procedure Laterality Date  . COLONOSCOPY  03/27/2015  . CYSTOSCOPY WITH RETROGRADE PYELOGRAM, URETEROSCOPY AND STENT PLACEMENT Left 05/31/2018   Procedure: CYSTOSCOPY WITH RETROGRADE PYELOGRAM, URETEROSCOPY AND STENT PLACEMENT;  Surgeon: Alexis Frock, MD;  Location: [facility];  Service: Urology;  Laterality: Left;  1 HR  . HERNIA REPAIR     umbilical with mesh  . HOLMIUM LASER APPLICATION Left [id number]   Procedure: HOLMIUM LASER APPLICATION;  Surgeon: Alexis Frock, MD;  Location: [facility];  Service: Urology;  Laterality: Left;  . KNEE SURGERY Right    mensicus   2011  . POLYPECTOMY    . UPPER GI ENDOSCOPY  06/16/2005    reports that he has been smoking cigars. He has never used smokeless tobacco. He reports current alcohol use of about 1.0 standard drinks of alcohol per week. He reports that he does not use drugs. family history includes Colon cancer (age of onset: 22) in his mother; Pancreatic cancer in his paternal grandfather. Allergies  Allergen Reactions  . Olive Tree Other (See Comments)   Current Outpatient Medications on File Prior to  Visit  Medication Sig Dispense Refill  . aspirin 81 MG tablet Take 81 mg by mouth daily.      Marland Kitchen ibuprofen (ADVIL,MOTRIN) 200 MG tablet Take 200 mg by mouth every 6 (six) hours as needed for headache or moderate pain.     Current Facility-Administered Medications on File Prior to Visit  Medication Dose Route Frequency Provider Last Rate Last Admin  . gentamicin (GARAMYCIN) 500 mg in dextrose 5 % 100 mL IVPB  500 mg Intravenous Once Alexis Frock, MD       Review of Systems All otherwise neg per pt     Objective:   Physical Exam BP 130/80   Pulse (!) 56   Temp 97.9 F (36.6 C)   Ht 6\' 4"  (1.93 m)   Wt 283 lb (128.4 kg)   SpO2 98%   BMI 34.45 kg/m  VS noted,    Constitutional: Pt appears in NAD HENT: Head: NCAT.  Right Ear: External ear normal.  Left Ear: External ear normal.  Eyes: . Pupils are equal, round, and reactive to light. Conjunctivae and EOM are normal Nose: without d/c or deformity Neck: Neck supple. Gross normal ROM Cardiovascular: Normal rate and regular rhythm.   Pulmonary/Chest: Effort normal and breath sounds without rales or wheezing.  Abd:  Soft, NT, ND, + BS, no organomegaly Neurological: Pt is alert. At baseline orientation, motor grossly intact Skin: Skin is warm. No rashes, other new lesions, no LE edema Psychiatric: Pt behavior is normal without agitation  All otherwise neg per pt All otherwise neg per pt  Lab Results  Component Value Date   WBC 7.6 04/26/2019   HGB 17.5 (H) 04/26/2019   HCT 51.5 04/26/2019   PLT 225.0 04/26/2019   GLUCOSE 110 (H) 04/26/2019   CHOL 114 04/26/2019   TRIG 105.0 04/26/2019   HDL 37.40 (L) 04/26/2019   LDLDIRECT 148.7 04/12/2008   LDLCALC 56 04/26/2019   ALT 27 04/26/2019   AST 17 04/26/2019   NA 139 04/26/2019   K 4.4 04/26/2019   CL 105 04/26/2019   CREATININE 1.24 04/26/2019   BUN 17 04/26/2019   CO2 26 04/26/2019   TSH 2.61 04/26/2019   PSA 0.91 04/26/2019   HGBA1C 5.8 04/26/2019        Assessment & Plan:

## 2019-04-30 ENCOUNTER — Other Ambulatory Visit: Payer: Self-pay | Admitting: Internal Medicine

## 2019-04-30 MED ORDER — VITAMIN D (ERGOCALCIFEROL) 1.25 MG (50000 UNIT) PO CAPS: 50000.0000 [IU] | ORAL_CAPSULE | ORAL | 0 refills | Status: DC

## 2019-05-01 ENCOUNTER — Ambulatory Visit: Payer: Medicare Other

## 2019-05-03 ENCOUNTER — Ambulatory Visit: Payer: Medicare Other

## 2019-06-28 DIAGNOSIS — Z87442 Personal history of urinary calculi: Secondary | ICD-10-CM | POA: Diagnosis not present

## 2019-06-28 DIAGNOSIS — R351 Nocturia: Secondary | ICD-10-CM | POA: Diagnosis not present

## 2019-06-28 DIAGNOSIS — N5201 Erectile dysfunction due to arterial insufficiency: Secondary | ICD-10-CM | POA: Diagnosis not present

## 2019-07-11 ENCOUNTER — Telehealth: Payer: Self-pay | Admitting: Internal Medicine

## 2019-07-11 DIAGNOSIS — M79673 Pain in unspecified foot: Secondary | ICD-10-CM

## 2019-07-11 NOTE — Telephone Encounter (Signed)
New message:   Pt is calling and states he would like a referral to see a podiatrist. Please advise.

## 2019-07-12 NOTE — Telephone Encounter (Signed)
Ok this is done 

## 2019-07-13 NOTE — Telephone Encounter (Signed)
Left vm for pt informing him that he will be receiving a call about his referral.

## 2019-08-06 ENCOUNTER — Encounter: Payer: Self-pay | Admitting: Podiatry

## 2019-08-06 ENCOUNTER — Ambulatory Visit (INDEPENDENT_AMBULATORY_CARE_PROVIDER_SITE_OTHER): Payer: Medicare Other | Admitting: Podiatry

## 2019-08-06 ENCOUNTER — Ambulatory Visit (INDEPENDENT_AMBULATORY_CARE_PROVIDER_SITE_OTHER): Payer: Medicare Other

## 2019-08-06 ENCOUNTER — Other Ambulatory Visit: Payer: Self-pay

## 2019-08-06 ENCOUNTER — Other Ambulatory Visit: Payer: Self-pay | Admitting: Podiatry

## 2019-08-06 VITALS — Temp 97.2°F

## 2019-08-06 DIAGNOSIS — M722 Plantar fascial fibromatosis: Secondary | ICD-10-CM

## 2019-08-06 DIAGNOSIS — R52 Pain, unspecified: Secondary | ICD-10-CM

## 2019-08-06 NOTE — Patient Instructions (Signed)

## 2019-08-06 NOTE — Progress Notes (Signed)
Subjective:   Patient ID: Antonio Reyes, male   DOB: 74 y.o.   MRN: [id number]   HPI Patient presents stating my heels are really bothering me left over right and its been going on for about a year and I do not remember injury and states that it is gradually gotten worse over that time and he is not as active as he was.  Patient does not smoke and does like to be active   Review of Systems  All other systems reviewed and are negative.       Objective:  Physical Exam Vitals and nursing note reviewed.  Constitutional:      Appearance: He is well-developed.  Pulmonary:     Effort: Pulmonary effort is normal.  Musculoskeletal:        General: Normal range of motion.  Skin:    General: Skin is warm.  Neurological:     Mental Status: He is alert.     Neurovascular status intact muscle strength was found to be adequate range of motion within normal limits.  Patient is found to have exquisite discomfort plantar aspect heel region bilateral with inflammation fluid around the medial band it is noted to have good digital perfusion and well oriented x3.  Patient has moderate cavus foot structure and enlargement of the back of the heels bilateral     Assessment:  Acute plantar fasciitis left over right with inflammation fluid along with moderate cavus foot structure     Plan:  H&P conditions reviewed and I went ahead did sterile prep injected the fascia bilateral 3 mg Kenalog 5 mg Xylocaine and applied fascial brace to lift up the arch bilateral.  Explained condition and patient will be seen back to recheck  X-rays indicate that there is posterior spur minimal plantar spur formation bilateral and cavus foot structure bilateral

## 2019-08-30 ENCOUNTER — Ambulatory Visit: Payer: Medicare Other | Admitting: Podiatry

## 2019-09-06 ENCOUNTER — Ambulatory Visit (INDEPENDENT_AMBULATORY_CARE_PROVIDER_SITE_OTHER): Payer: Medicare Other | Admitting: Podiatry

## 2019-09-06 ENCOUNTER — Other Ambulatory Visit: Payer: Self-pay

## 2019-09-06 ENCOUNTER — Encounter: Payer: Self-pay | Admitting: Podiatry

## 2019-09-06 DIAGNOSIS — M722 Plantar fascial fibromatosis: Secondary | ICD-10-CM

## 2019-09-06 NOTE — Progress Notes (Signed)
Subjective:   Patient ID: Antonio Reyes, male   DOB: 74 y.o.   MRN: [id number]   HPI Patient presents stating that my left heel is still bothering me so my right is totally better and I am improved on both the quite a decent degree   ROS      Objective:  Physical Exam  Neurovascular status intact with exquisite discomfort plantar heel left still present but improved from previous with moderate pain in the right     Assessment:  Planter fasciitis bilateral with improvement but pain still noted in the left plantar fashion     Plan:  Reviewed condition I did do a sterile prep left and reinjected the fascia 3 mg Kenalog 5 mg Xylocaine and advised on continued support.  Reappoint to recheck

## 2019-09-19 DIAGNOSIS — D225 Melanocytic nevi of trunk: Secondary | ICD-10-CM | POA: Diagnosis not present

## 2019-09-19 DIAGNOSIS — L821 Other seborrheic keratosis: Secondary | ICD-10-CM | POA: Diagnosis not present

## 2019-09-19 DIAGNOSIS — L570 Actinic keratosis: Secondary | ICD-10-CM | POA: Diagnosis not present

## 2019-09-19 DIAGNOSIS — L853 Xerosis cutis: Secondary | ICD-10-CM | POA: Diagnosis not present

## 2019-09-19 DIAGNOSIS — D1801 Hemangioma of skin and subcutaneous tissue: Secondary | ICD-10-CM | POA: Diagnosis not present

## 2019-12-14 DIAGNOSIS — Z23 Encounter for immunization: Secondary | ICD-10-CM | POA: Diagnosis not present

## 2019-12-26 DIAGNOSIS — D229 Melanocytic nevi, unspecified: Secondary | ICD-10-CM | POA: Diagnosis not present

## 2019-12-26 DIAGNOSIS — L570 Actinic keratosis: Secondary | ICD-10-CM | POA: Diagnosis not present

## 2019-12-26 DIAGNOSIS — L821 Other seborrheic keratosis: Secondary | ICD-10-CM | POA: Diagnosis not present

## 2019-12-26 DIAGNOSIS — D485 Neoplasm of uncertain behavior of skin: Secondary | ICD-10-CM | POA: Diagnosis not present

## 2019-12-26 DIAGNOSIS — L814 Other melanin hyperpigmentation: Secondary | ICD-10-CM | POA: Diagnosis not present

## 2020-01-07 DIAGNOSIS — Z23 Encounter for immunization: Secondary | ICD-10-CM | POA: Diagnosis not present

## 2020-01-29 DIAGNOSIS — S61012A Laceration without foreign body of left thumb without damage to nail, initial encounter: Secondary | ICD-10-CM | POA: Diagnosis not present

## 2020-01-29 DIAGNOSIS — W312XXA Contact with powered woodworking and forming machines, initial encounter: Secondary | ICD-10-CM | POA: Diagnosis not present

## 2020-01-29 DIAGNOSIS — E785 Hyperlipidemia, unspecified: Secondary | ICD-10-CM | POA: Diagnosis not present

## 2020-01-29 DIAGNOSIS — I10 Essential (primary) hypertension: Secondary | ICD-10-CM | POA: Diagnosis not present

## 2020-01-31 DIAGNOSIS — S6432XA Injury of digital nerve of left thumb, initial encounter: Secondary | ICD-10-CM | POA: Insufficient documentation

## 2020-02-01 IMAGING — DX DG LUMBAR SPINE 2-3V
3 series · 3 of 3 positions shown · non-contrast
Comparison: None.

CLINICAL DATA: Right-sided low back pain for 2 weeks after hitting
balls in batting cage

EXAM:
LUMBAR SPINE - 2-3 VIEW

[l-spine ap]
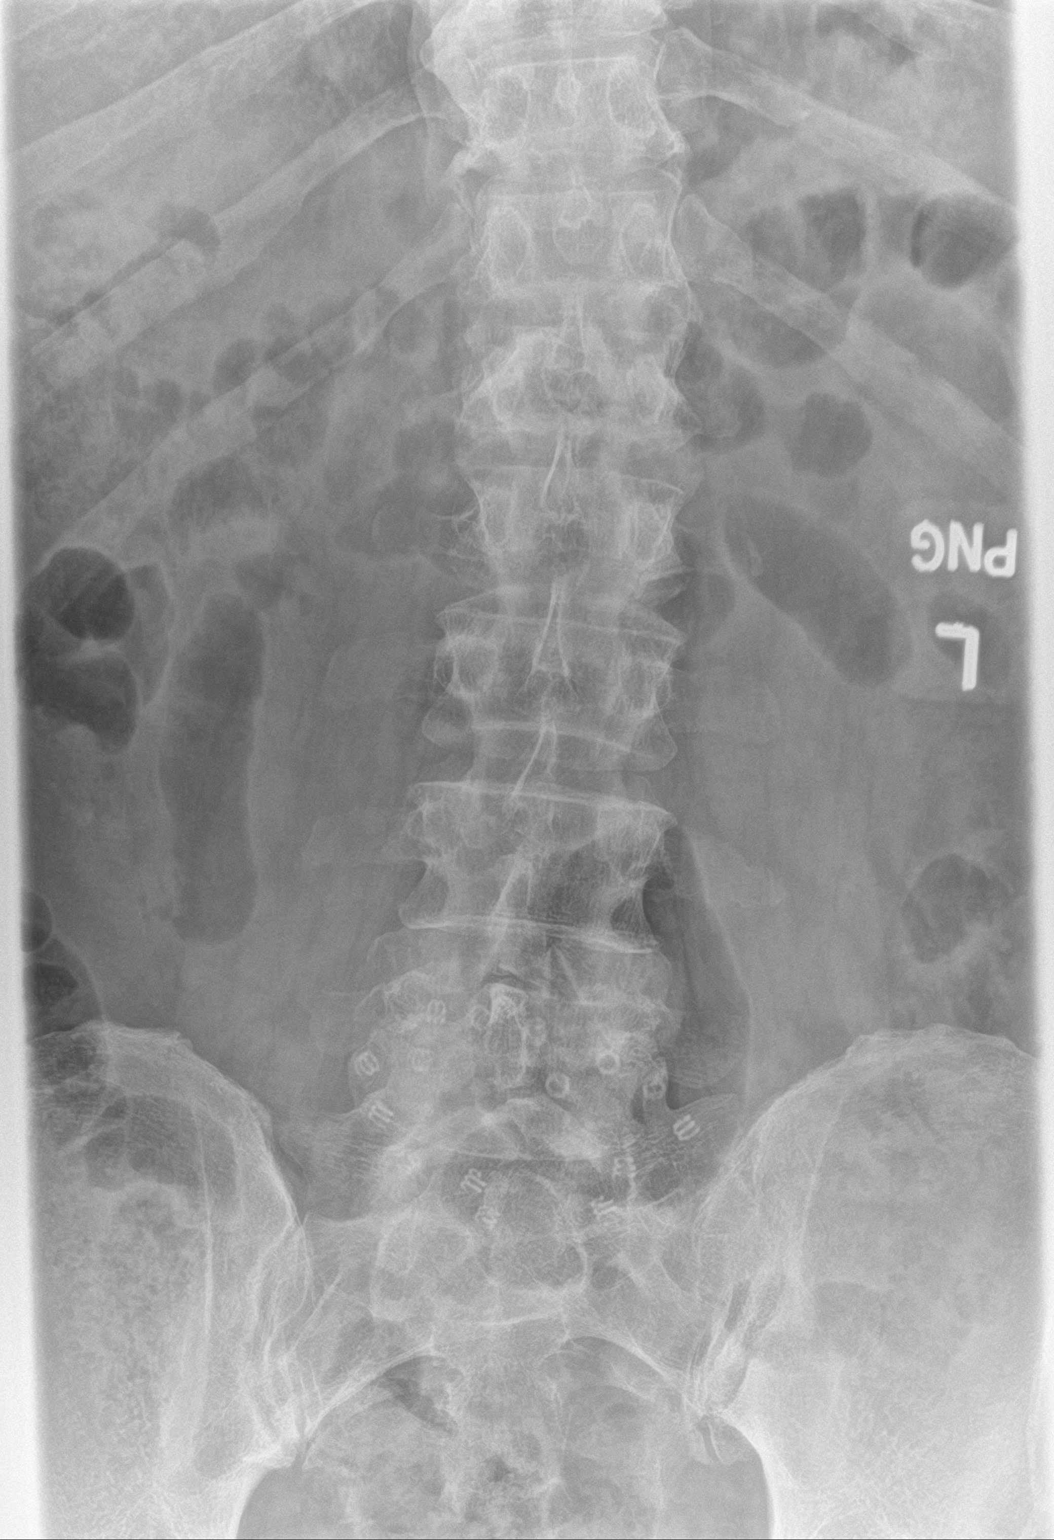

[l-spine lat]
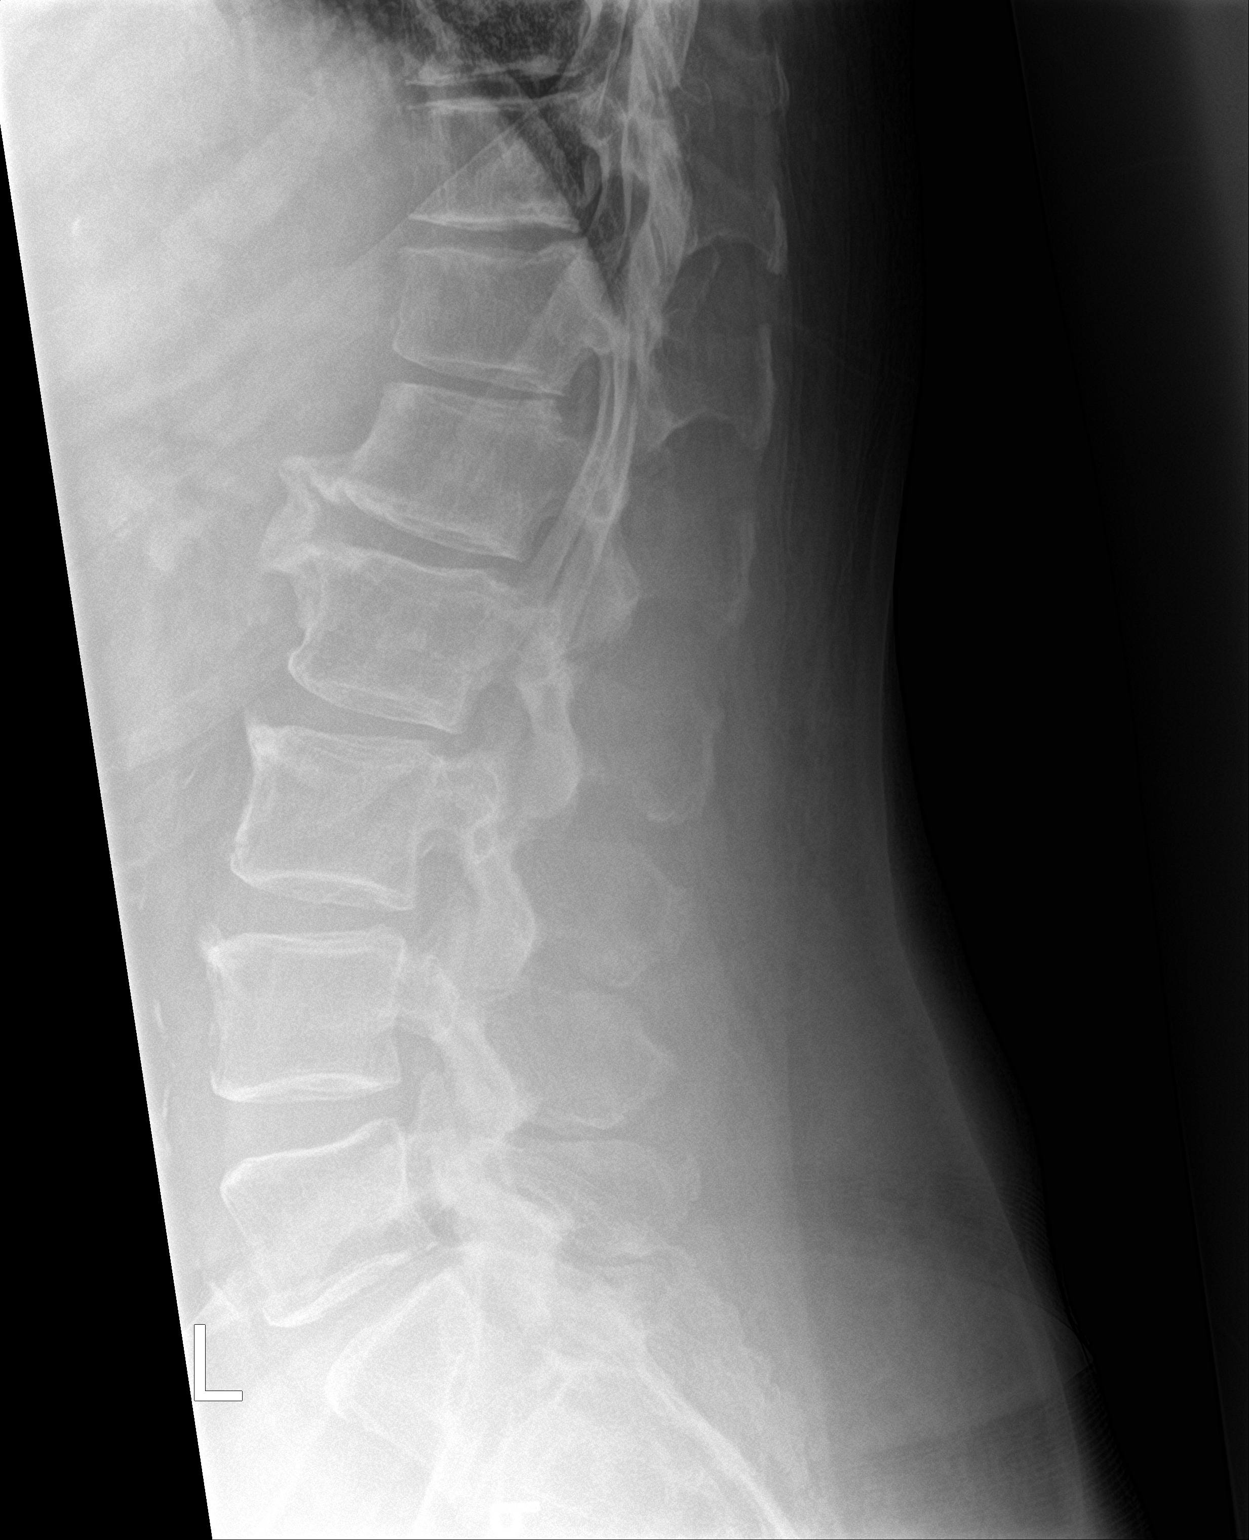

[l-spine spot]
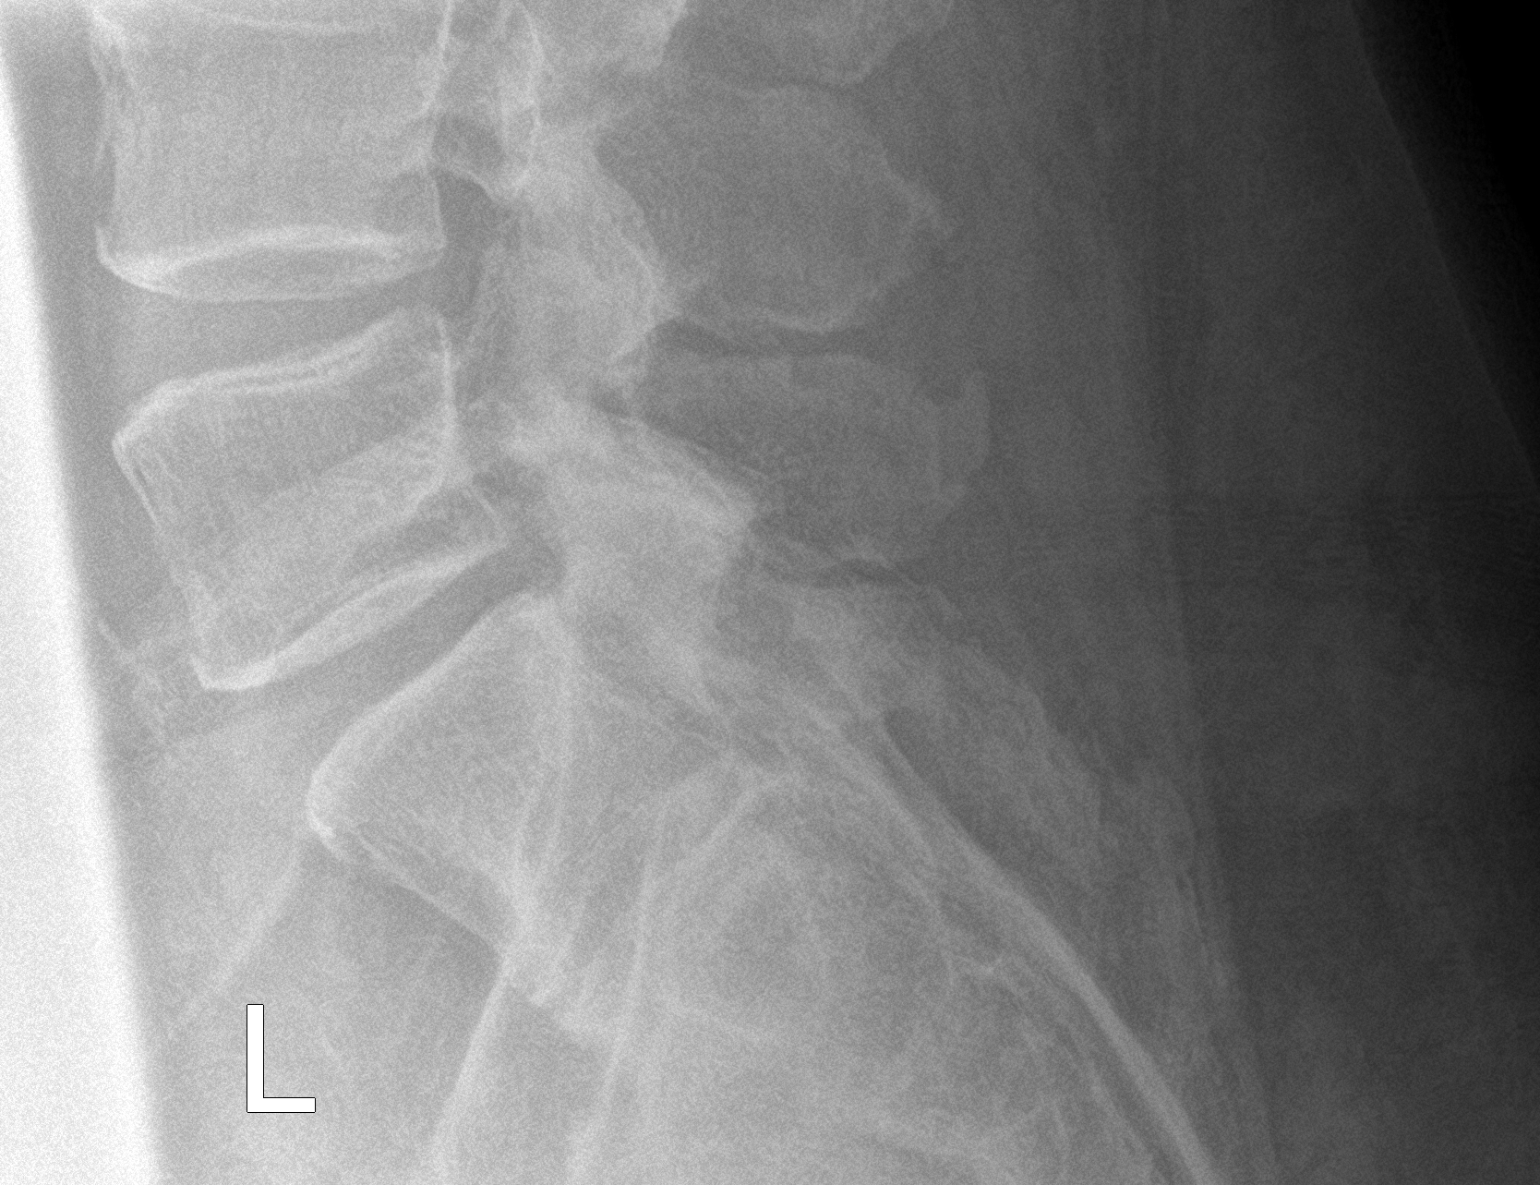

[3 of 3 positions shown; findings below may reference images not displayed]

FINDINGS: There appear to be 6 non rib-bearing lumbar vertebra with partial
lumbarization of S1. No compression deformity is seen. There is
anterior osteophyte formation primarily at L1-2 and to a lesser
degree at L2-3 and L3-4 levels. Intervertebral disc spaces are
relatively well preserved. Masses noted overlying the region of the
umbilicus.
IMPRESSION: Normal alignment of the lumbar vertebrae with partial lumbarization
of S1. Mild degenerative change is present, but no acute abnormality
is seen.

## 2020-02-06 DIAGNOSIS — S66022A Laceration of long flexor muscle, fascia and tendon of left thumb at wrist and hand level, initial encounter: Secondary | ICD-10-CM | POA: Diagnosis not present

## 2020-02-06 DIAGNOSIS — S61012A Laceration without foreign body of left thumb without damage to nail, initial encounter: Secondary | ICD-10-CM | POA: Diagnosis not present

## 2020-02-06 DIAGNOSIS — S6432XA Injury of digital nerve of left thumb, initial encounter: Secondary | ICD-10-CM | POA: Diagnosis not present

## 2020-02-07 DIAGNOSIS — R001 Bradycardia, unspecified: Secondary | ICD-10-CM | POA: Diagnosis not present

## 2020-02-07 DIAGNOSIS — Z950 Presence of cardiac pacemaker: Secondary | ICD-10-CM | POA: Diagnosis not present

## 2020-02-21 DIAGNOSIS — R200 Anesthesia of skin: Secondary | ICD-10-CM | POA: Diagnosis not present

## 2020-02-21 DIAGNOSIS — S66022D Laceration of long flexor muscle, fascia and tendon of left thumb at wrist and hand level, subsequent encounter: Secondary | ICD-10-CM | POA: Diagnosis not present

## 2020-02-21 DIAGNOSIS — R202 Paresthesia of skin: Secondary | ICD-10-CM | POA: Diagnosis not present

## 2020-02-21 DIAGNOSIS — M79645 Pain in left finger(s): Secondary | ICD-10-CM | POA: Diagnosis not present

## 2020-02-21 DIAGNOSIS — S6432XD Injury of digital nerve of left thumb, subsequent encounter: Secondary | ICD-10-CM | POA: Diagnosis not present

## 2020-02-21 DIAGNOSIS — M25642 Stiffness of left hand, not elsewhere classified: Secondary | ICD-10-CM | POA: Diagnosis not present

## 2020-02-26 DIAGNOSIS — S66022D Laceration of long flexor muscle, fascia and tendon of left thumb at wrist and hand level, subsequent encounter: Secondary | ICD-10-CM | POA: Diagnosis not present

## 2020-02-26 DIAGNOSIS — M25642 Stiffness of left hand, not elsewhere classified: Secondary | ICD-10-CM | POA: Diagnosis not present

## 2020-02-26 DIAGNOSIS — R200 Anesthesia of skin: Secondary | ICD-10-CM | POA: Diagnosis not present

## 2020-02-26 DIAGNOSIS — S6432XD Injury of digital nerve of left thumb, subsequent encounter: Secondary | ICD-10-CM | POA: Diagnosis not present

## 2020-02-26 DIAGNOSIS — R202 Paresthesia of skin: Secondary | ICD-10-CM | POA: Diagnosis not present

## 2020-02-26 DIAGNOSIS — M79645 Pain in left finger(s): Secondary | ICD-10-CM | POA: Diagnosis not present

## 2020-03-04 DIAGNOSIS — R200 Anesthesia of skin: Secondary | ICD-10-CM | POA: Diagnosis not present

## 2020-03-04 DIAGNOSIS — R202 Paresthesia of skin: Secondary | ICD-10-CM | POA: Diagnosis not present

## 2020-03-04 DIAGNOSIS — S6432XD Injury of digital nerve of left thumb, subsequent encounter: Secondary | ICD-10-CM | POA: Diagnosis not present

## 2020-03-04 DIAGNOSIS — S66022D Laceration of long flexor muscle, fascia and tendon of left thumb at wrist and hand level, subsequent encounter: Secondary | ICD-10-CM | POA: Diagnosis not present

## 2020-03-04 DIAGNOSIS — M25642 Stiffness of left hand, not elsewhere classified: Secondary | ICD-10-CM | POA: Diagnosis not present

## 2020-03-24 DIAGNOSIS — S66022D Laceration of long flexor muscle, fascia and tendon of left thumb at wrist and hand level, subsequent encounter: Secondary | ICD-10-CM | POA: Diagnosis not present

## 2020-03-24 DIAGNOSIS — S6432XD Injury of digital nerve of left thumb, subsequent encounter: Secondary | ICD-10-CM | POA: Diagnosis not present

## 2020-03-24 DIAGNOSIS — M25642 Stiffness of left hand, not elsewhere classified: Secondary | ICD-10-CM | POA: Diagnosis not present

## 2020-03-24 DIAGNOSIS — M79645 Pain in left finger(s): Secondary | ICD-10-CM | POA: Diagnosis not present

## 2020-03-24 DIAGNOSIS — R200 Anesthesia of skin: Secondary | ICD-10-CM | POA: Diagnosis not present

## 2020-03-24 DIAGNOSIS — R202 Paresthesia of skin: Secondary | ICD-10-CM | POA: Diagnosis not present

## 2020-04-08 DIAGNOSIS — M25642 Stiffness of left hand, not elsewhere classified: Secondary | ICD-10-CM | POA: Diagnosis not present

## 2020-04-08 DIAGNOSIS — S6432XD Injury of digital nerve of left thumb, subsequent encounter: Secondary | ICD-10-CM | POA: Diagnosis not present

## 2020-04-08 DIAGNOSIS — S66022D Laceration of long flexor muscle, fascia and tendon of left thumb at wrist and hand level, subsequent encounter: Secondary | ICD-10-CM | POA: Diagnosis not present

## 2020-04-08 DIAGNOSIS — R202 Paresthesia of skin: Secondary | ICD-10-CM | POA: Diagnosis not present

## 2020-04-08 DIAGNOSIS — M79645 Pain in left finger(s): Secondary | ICD-10-CM | POA: Diagnosis not present

## 2020-04-08 DIAGNOSIS — R200 Anesthesia of skin: Secondary | ICD-10-CM | POA: Diagnosis not present

## 2020-04-10 DIAGNOSIS — S66022D Laceration of long flexor muscle, fascia and tendon of left thumb at wrist and hand level, subsequent encounter: Secondary | ICD-10-CM | POA: Diagnosis not present

## 2020-04-10 DIAGNOSIS — M79645 Pain in left finger(s): Secondary | ICD-10-CM | POA: Diagnosis not present

## 2020-04-10 DIAGNOSIS — R200 Anesthesia of skin: Secondary | ICD-10-CM | POA: Diagnosis not present

## 2020-04-10 DIAGNOSIS — R202 Paresthesia of skin: Secondary | ICD-10-CM | POA: Diagnosis not present

## 2020-04-10 DIAGNOSIS — S6432XD Injury of digital nerve of left thumb, subsequent encounter: Secondary | ICD-10-CM | POA: Diagnosis not present

## 2020-04-10 DIAGNOSIS — M25642 Stiffness of left hand, not elsewhere classified: Secondary | ICD-10-CM | POA: Diagnosis not present

## 2020-04-16 DIAGNOSIS — M79645 Pain in left finger(s): Secondary | ICD-10-CM | POA: Diagnosis not present

## 2020-04-16 DIAGNOSIS — R202 Paresthesia of skin: Secondary | ICD-10-CM | POA: Diagnosis not present

## 2020-04-16 DIAGNOSIS — R200 Anesthesia of skin: Secondary | ICD-10-CM | POA: Diagnosis not present

## 2020-04-16 DIAGNOSIS — S6432XD Injury of digital nerve of left thumb, subsequent encounter: Secondary | ICD-10-CM | POA: Diagnosis not present

## 2020-04-16 DIAGNOSIS — S66022D Laceration of long flexor muscle, fascia and tendon of left thumb at wrist and hand level, subsequent encounter: Secondary | ICD-10-CM | POA: Diagnosis not present

## 2020-04-16 DIAGNOSIS — M25642 Stiffness of left hand, not elsewhere classified: Secondary | ICD-10-CM | POA: Diagnosis not present

## 2020-04-23 ENCOUNTER — Telehealth: Payer: Self-pay | Admitting: Internal Medicine

## 2020-04-23 DIAGNOSIS — R739 Hyperglycemia, unspecified: Secondary | ICD-10-CM

## 2020-04-23 DIAGNOSIS — E7800 Pure hypercholesterolemia, unspecified: Secondary | ICD-10-CM

## 2020-04-23 DIAGNOSIS — N1831 Chronic kidney disease, stage 3a: Secondary | ICD-10-CM

## 2020-04-23 DIAGNOSIS — N320 Bladder-neck obstruction: Secondary | ICD-10-CM

## 2020-04-23 NOTE — Telephone Encounter (Signed)
PT has CPE 2/18, lab appt 2/14. Need lab orders please.

## 2020-04-23 NOTE — Telephone Encounter (Signed)
Ok done

## 2020-04-24 DIAGNOSIS — M79645 Pain in left finger(s): Secondary | ICD-10-CM | POA: Diagnosis not present

## 2020-04-24 DIAGNOSIS — S66022D Laceration of long flexor muscle, fascia and tendon of left thumb at wrist and hand level, subsequent encounter: Secondary | ICD-10-CM | POA: Diagnosis not present

## 2020-04-24 DIAGNOSIS — S6432XD Injury of digital nerve of left thumb, subsequent encounter: Secondary | ICD-10-CM | POA: Diagnosis not present

## 2020-04-24 DIAGNOSIS — R202 Paresthesia of skin: Secondary | ICD-10-CM | POA: Diagnosis not present

## 2020-04-24 DIAGNOSIS — R200 Anesthesia of skin: Secondary | ICD-10-CM | POA: Diagnosis not present

## 2020-04-24 DIAGNOSIS — M25642 Stiffness of left hand, not elsewhere classified: Secondary | ICD-10-CM | POA: Diagnosis not present

## 2020-05-05 ENCOUNTER — Other Ambulatory Visit (INDEPENDENT_AMBULATORY_CARE_PROVIDER_SITE_OTHER): Payer: Medicare Other

## 2020-05-05 ENCOUNTER — Other Ambulatory Visit: Payer: Self-pay

## 2020-05-05 DIAGNOSIS — N320 Bladder-neck obstruction: Secondary | ICD-10-CM | POA: Diagnosis not present

## 2020-05-05 DIAGNOSIS — R739 Hyperglycemia, unspecified: Secondary | ICD-10-CM

## 2020-05-05 DIAGNOSIS — N1831 Chronic kidney disease, stage 3a: Secondary | ICD-10-CM

## 2020-05-05 DIAGNOSIS — E7800 Pure hypercholesterolemia, unspecified: Secondary | ICD-10-CM | POA: Diagnosis not present

## 2020-05-05 LAB — VITAMIN D 25 HYDROXY (VIT D DEFICIENCY, FRACTURES): VITD: 22.83 ng/mL — ABNORMAL LOW (ref 30.00–100.00)

## 2020-05-05 LAB — HEPATIC FUNCTION PANEL
ALT: 29 U/L (ref 0–53)
AST: 19 U/L (ref 0–37)
Albumin: 4.4 g/dL (ref 3.5–5.2)
Alkaline Phosphatase: 72 U/L (ref 39–117)
Bilirubin, Direct: 0.2 mg/dL (ref 0.0–0.3)
Total Bilirubin: 0.8 mg/dL (ref 0.2–1.2)
Total Protein: 6.9 g/dL (ref 6.0–8.3)

## 2020-05-05 LAB — PSA: PSA: 1.09 ng/mL (ref 0.10–4.00)

## 2020-05-05 LAB — URINALYSIS, ROUTINE W REFLEX MICROSCOPIC
Bilirubin Urine: NEGATIVE
Hgb urine dipstick: NEGATIVE
Ketones, ur: NEGATIVE
Leukocytes,Ua: NEGATIVE
Nitrite: NEGATIVE
RBC / HPF: NONE SEEN (ref 0–?)
Specific Gravity, Urine: 1.025 (ref 1.000–1.030)
Total Protein, Urine: NEGATIVE
Urine Glucose: NEGATIVE
Urobilinogen, UA: 0.2 (ref 0.0–1.0)
pH: 5.5 (ref 5.0–8.0)

## 2020-05-05 LAB — CBC WITH DIFFERENTIAL/PLATELET
Basophils Absolute: 0.1 10*3/uL (ref 0.0–0.1)
Basophils Relative: 0.8 % (ref 0.0–3.0)
Eosinophils Absolute: 0.2 10*3/uL (ref 0.0–0.7)
Eosinophils Relative: 2.3 % (ref 0.0–5.0)
HCT: 50.6 % (ref 39.0–52.0)
Hemoglobin: 17.1 g/dL — ABNORMAL HIGH (ref 13.0–17.0)
Lymphocytes Relative: 30.8 % (ref 12.0–46.0)
Lymphs Abs: 2.5 10*3/uL (ref 0.7–4.0)
MCHC: 33.8 g/dL (ref 30.0–36.0)
MCV: 90.5 fl (ref 78.0–100.0)
Monocytes Absolute: 0.6 10*3/uL (ref 0.1–1.0)
Monocytes Relative: 7.2 % (ref 3.0–12.0)
Neutro Abs: 4.7 10*3/uL (ref 1.4–7.7)
Neutrophils Relative %: 58.9 % (ref 43.0–77.0)
Platelets: 225 10*3/uL (ref 150.0–400.0)
RBC: 5.6 Mil/uL (ref 4.22–5.81)
RDW: 13.3 % (ref 11.5–15.5)
WBC: 8 10*3/uL (ref 4.0–10.5)

## 2020-05-05 LAB — BASIC METABOLIC PANEL
BUN: 22 mg/dL (ref 6–23)
CO2: 25 mEq/L (ref 19–32)
Calcium: 9.6 mg/dL (ref 8.4–10.5)
Chloride: 103 mEq/L (ref 96–112)
Creatinine, Ser: 1.3 mg/dL (ref 0.40–1.50)
GFR: 54.82 mL/min — ABNORMAL LOW (ref >60.00)
Glucose, Bld: 109 mg/dL — ABNORMAL HIGH (ref 70–99)
Potassium: 4.5 mEq/L (ref 3.5–5.1)
Sodium: 138 mEq/L (ref 135–145)

## 2020-05-05 LAB — LIPID PANEL
Cholesterol: 122 mg/dL (ref 0–200)
HDL: 38.1 mg/dL — ABNORMAL LOW (ref >39.00)
LDL Cholesterol: 58 mg/dL (ref 0–99)
NonHDL: 83.6
Total CHOL/HDL Ratio: 3
Triglycerides: 130 mg/dL (ref 0.0–149.0)
VLDL: 26 mg/dL (ref 0.0–40.0)

## 2020-05-05 LAB — PHOSPHORUS: Phosphorus: 4 mg/dL (ref 2.3–4.6)

## 2020-05-05 LAB — HEMOGLOBIN A1C: Hgb A1c MFr Bld: 5.8 % (ref 4.6–6.5)

## 2020-05-05 LAB — TSH: TSH: 3.26 u[IU]/mL (ref 0.35–4.50)

## 2020-05-07 LAB — PTH, INTACT AND CALCIUM
Calcium: 9.1 mg/dL (ref 8.6–10.3)
PTH: 28 pg/mL (ref 14–64)

## 2020-05-08 DIAGNOSIS — M25642 Stiffness of left hand, not elsewhere classified: Secondary | ICD-10-CM | POA: Diagnosis not present

## 2020-05-09 ENCOUNTER — Encounter: Payer: Self-pay | Admitting: Internal Medicine

## 2020-05-09 ENCOUNTER — Ambulatory Visit (INDEPENDENT_AMBULATORY_CARE_PROVIDER_SITE_OTHER): Payer: Medicare Other | Admitting: Internal Medicine

## 2020-05-09 ENCOUNTER — Other Ambulatory Visit: Payer: Self-pay

## 2020-05-09 VITALS — BP 130/74 | HR 56 | Temp 98.1°F | Ht 76.0 in | Wt 294.0 lb

## 2020-05-09 DIAGNOSIS — N1831 Chronic kidney disease, stage 3a: Secondary | ICD-10-CM

## 2020-05-09 DIAGNOSIS — E7800 Pure hypercholesterolemia, unspecified: Secondary | ICD-10-CM

## 2020-05-09 DIAGNOSIS — R739 Hyperglycemia, unspecified: Secondary | ICD-10-CM

## 2020-05-09 DIAGNOSIS — I10 Essential (primary) hypertension: Secondary | ICD-10-CM

## 2020-05-09 DIAGNOSIS — E559 Vitamin D deficiency, unspecified: Secondary | ICD-10-CM

## 2020-05-09 MED ORDER — ATORVASTATIN CALCIUM 40 MG PO TABS
ORAL_TABLET | ORAL | 3 refills | Status: DC
Start: 2020-05-09 — End: 2021-05-20

## 2020-05-09 MED ORDER — MONTELUKAST SODIUM 10 MG PO TABS: ORAL_TABLET | ORAL | 3 refills | Status: DC

## 2020-05-09 MED ORDER — METOPROLOL SUCCINATE ER 50 MG PO TB24: ORAL_TABLET | ORAL | 3 refills | Status: DC

## 2020-05-09 NOTE — Assessment & Plan Note (Signed)
Lab Results  Component Value Date   LDLCALC 58 05/05/2020   Stable, pt to continue current statin  - lipitor 40

## 2020-05-09 NOTE — Progress Notes (Signed)
Patient ID: Antonio Reyes, male   DOB: [birth year], 75 y.o.   MRN: [id number]         Chief Complaint:: yearly exam, low vit d, hyperglycmia, HLD with low HDL, CKD       HPI:  Antonio Reyes is a 75 y.o. male here for wellness exam; pt is up to date with preventive measures and immunizations.  Had lost significant wt a few yrs ago with the keto diet but unfortuantely had gained all of it back..  Plans to try to do better again with diet and activity.  Had recent left thumb surgury and still some decreased ROM and less grip strength but plans to keep working on this as well.  No other specific complaints.  Pt denies chest pain, increased sob or doe, wheezing, orthopnea, PND, increased LE swelling, palpitations, dizziness or syncope.   Denies focal neuro s/s,  Pt denies polydipsia, polyuria, .  Pt states overall good compliance with meds, and fair success with trying to follow lower cholesterol diet.          Wt Readings from Last 3 Encounters:  05/09/20 294 lb (133.4 kg)  04/26/19 283 lb (128.4 kg)  10/24/18 271 lb (122.9 kg)   BP Readings from Last 3 Encounters:  05/09/20 130/74  04/26/19 130/80  10/24/18 122/78   Immunization History  Administered Date(s) Administered  . H1N1 04/12/2008  . Influenza Split 01/22/2008, 01/28/2009, 12/23/2012  . Influenza Whole 01/22/2008, 01/28/2009  . Influenza, High Dose Seasonal PF 02/13/2014, 04/21/2016, 12/21/2016, 01/17/2018, 12/23/2018, 01/07/2020  . Influenza, Seasonal, Injecte, Preservative Fre 12/20/2012  . PFIZER(Purple Top)SARS-COV-2 Vaccination 04/28/2019, 05/19/2019, 12/14/2019  . Pneumococcal Conjugate-13 02/09/2013  . Pneumococcal Polysaccharide-23 04/17/2014  . Td 04/12/2008  . Tdap 04/12/2008, 08/23/2014, 04/24/2018  . Zoster 04/12/2008  There are no preventive care reminders to display for this patient.    Past Medical History:  Diagnosis Date  . Abdominal hernia   . Allergic rhinitis   . Anxiety    pt states no history of  anxiety  . Aortic atherosclerosis ([place]) 04/25/2018   noted on CT renal  . Benign neoplasm of stomach   . BPH (benign prostatic hypertrophy)    pt unaware  . C6 cervical fracture (HCC)    C7,C8  . Cancer ([place]) 2000   skin cancer   left ear  . Chronic sinusitis   . CKD (chronic kidney disease), stage III ([place])    pt unaware  . Diverticulitis   . Diverticulosis 03/27/2015   Moderate, Noted on colonoscopy  . Dizziness   . ED (erectile dysfunction)   . GERD (gastroesophageal reflux disease)   . History of herpes genitalis   . History of kidney stones 04/25/2018   4 mm left UVJ stone, 10 mm bladder stone, noted on CT renal  . HLD (hyperlipidemia)   . HTN (hypertension)   . Hydronephrosis    Left, Moderate, noted on CT renal  . Inguinal hernia 04/25/2018   Bilateral, noted on CT renal, pt unaware  . Low back pain   . Migraine    hx of  . Neck pain, bilateral   . Obesity   . Polycythemia 2000   per Dr. Elsworth Soho  . Rotator cuff tear   . Sleep apnea    CPAP  . Tubular adenoma of colon 05/2008   Past Surgical History:  Procedure Laterality Date  . COLONOSCOPY  03/27/2015  . CYSTOSCOPY WITH RETROGRADE PYELOGRAM, URETEROSCOPY AND STENT PLACEMENT Left 05/31/2018  Procedure: CYSTOSCOPY WITH RETROGRADE PYELOGRAM, URETEROSCOPY AND STENT PLACEMENT;  Surgeon: Alexis Frock, MD;  Location: [facility];  Service: Urology;  Laterality: Left;  1 HR  . HERNIA REPAIR     umbilical with mesh  . HOLMIUM LASER APPLICATION Left 7/86/7672   Procedure: HOLMIUM LASER APPLICATION;  Surgeon: Alexis Frock, MD;  Location: [facility];  Service: Urology;  Laterality: Left;  . KNEE SURGERY Right    mensicus   2011  . POLYPECTOMY    . UPPER GI ENDOSCOPY  06/16/2005    reports that he has been smoking cigars. He has never used smokeless tobacco. He reports current alcohol use of about 1.0 standard drink of alcohol per week. He reports that he does not use drugs. family history includes Colon cancer (age of  onset: 9) in his mother; Pancreatic cancer in his paternal grandfather. Allergies  Allergen Reactions  . [place] Other (See Comments)  . Pollen Extract     Other reaction(s): Other (See Comments)   Current Outpatient Medications on File Prior to Visit  Medication Sig Dispense Refill  . aspirin 81 MG tablet Take 81 mg by mouth daily.    . fluticasone (FLONASE) 50 MCG/ACT nasal spray USE 2 SPRAYS IN EACH NOSTRIL EVERY DAY (SUBSTITUTED FOR  FLONASE) 48 g 3  . ibuprofen (ADVIL,MOTRIN) 200 MG tablet Take 200 mg by mouth every 6 (six) hours as needed for headache or moderate pain.     Current Facility-Administered Medications on File Prior to Visit  Medication Dose Route Frequency Provider Last Rate Last Admin  . gentamicin (GARAMYCIN) 500 mg in dextrose 5 % 100 mL IVPB  500 mg Intravenous Once Alexis Frock, MD            ROS:  All others reviewed and negative.  Objective        PE:  BP 130/74   Pulse (!) 56   Temp 98.1 F (36.7 C) (Oral)   Ht 6\' 4"  (1.93 m)   Wt 294 lb (133.4 kg)   SpO2 95%   BMI 35.79 kg/m                 Constitutional: Pt appears in NAD               HENT: Head: NCAT.                Right Ear: External ear normal.                 Left Ear: External ear normal.                Eyes: . Pupils are equal, round, and reactive to light. Conjunctivae and EOM are normal               Nose: without d/c or deformity               Neck: Neck supple. Gross normal ROM               Cardiovascular: Normal rate and regular rhythm.                 Pulmonary/Chest: Effort normal and breath sounds without rales or wheezing.                Abd:  Soft, NT, ND, + BS, no organomegaly               Neurological: Pt is alert. At baseline orientation, motor grossly intact  Skin: Skin is warm. No rashes, no other new lesions, LE edema - none               Psychiatric: Pt behavior is normal without agitation   Micro: none  Cardiac tracings I have personally  interpreted today:  none  Pertinent Radiological findings (summarize): none   Lab Results  Component Value Date   WBC 8.0 05/05/2020   HGB 17.1 (H) 05/05/2020   HCT 50.6 05/05/2020   PLT 225.0 05/05/2020   GLUCOSE 109 (H) 05/05/2020   CHOL 122 05/05/2020   TRIG 130.0 05/05/2020   HDL 38.10 (L) 05/05/2020   LDLDIRECT 148.7 04/12/2008   LDLCALC 58 05/05/2020   ALT 29 05/05/2020   AST 19 05/05/2020   NA 138 05/05/2020   K 4.5 05/05/2020   CL 103 05/05/2020   CREATININE 1.30 05/05/2020   BUN 22 05/05/2020   CO2 25 05/05/2020   TSH 3.26 05/05/2020   PSA 1.09 05/05/2020   HGBA1C 5.8 05/05/2020   Assessment/Plan:  MERVIL WACKER is a 75 y.o. White or Caucasian [1] male with  has a past medical history of Abdominal hernia, Allergic rhinitis, Anxiety, Aortic atherosclerosis ([place]) (04/25/2018), Benign neoplasm of stomach, BPH (benign prostatic hypertrophy), C6 cervical fracture ([place]), Cancer ([place]) (2000), Chronic sinusitis, CKD (chronic kidney disease), stage III ([place]), Diverticulitis, Diverticulosis (03/27/2015), Dizziness, ED (erectile dysfunction), GERD (gastroesophageal reflux disease), History of herpes genitalis, History of kidney stones (04/25/2018), HLD (hyperlipidemia), HTN (hypertension), Hydronephrosis, Inguinal hernia (04/25/2018), Low back pain, Migraine, Neck pain, bilateral, Obesity, Polycythemia (2000), Rotator cuff tear, Sleep apnea, and Tubular adenoma of colon (05/2008).  CKD (chronic kidney disease) stage 3, GFR 30-59 ml/min (HCC) Lab Results  Component Value Date   CREATININE 1.30 05/05/2020   Stable overall, cont to avoid nephrotoxins, f/u 6 mo  Essential hypertension BP Readings from Last 3 Encounters:  05/09/20 130/74  04/26/19 130/80  10/24/18 122/78   Stable, pt to continue medical treatment  - toprol    Current Outpatient Medications (Cardiovascular):  .  atorvastatin (LIPITOR) 40 MG tablet, TAKE 1 TABLET EVERY DAY .  metoprolol succinate (TOPROL-XL)  50 MG 24 hr tablet, TAKE 1 TABLET EVERY DAY   Current Outpatient Medications (Respiratory):  .  fluticasone (FLONASE) 50 MCG/ACT nasal spray, USE 2 SPRAYS IN EACH NOSTRIL EVERY DAY (SUBSTITUTED FOR  FLONASE) .  montelukast (SINGULAIR) 10 MG tablet, TAKE 1 TABLET EVERY DAY   Current Outpatient Medications (Analgesics):  .  aspirin 81 MG tablet, Take 81 mg by mouth daily. Marland Kitchen  ibuprofen (ADVIL,MOTRIN) 200 MG tablet, Take 200 mg by mouth every 6 (six) hours as needed for headache or moderate pain.     Current Outpatient Medications (Other):  Marland Kitchen  Vitamin D, Ergocalciferol, (DRISDOL) 1.25 MG (50000 UNIT) CAPS capsule, Take 1 capsule (50,000 Units total) by mouth every 7 (seven) days. (Patient not taking: Reported on 05/09/2020)   Facility-Administered Medications Ordered in Other Visits (Other):  .  gentamicin (GARAMYCIN) 500 mg in dextrose 5 % 100 mL IVPB No current facility-administered medications for this visit.   HYPERCHOLESTEROLEMIA Lab Results  Component Value Date   LDLCALC 58 05/05/2020   Stable, pt to continue current statin  - lipitor 40   Hyperglycemia Lab Results  Component Value Date   HGBA1C 5.8 05/05/2020   Stable, pt to continue current medical treatment  - diet   Vitamin D deficiency Last vitamin D Lab Results  Component Value Date   VD25OH 22.83 (L) 05/05/2020  Low, to start vitd d3 2000 u oral replacement   Followup: Return in about 6 months (around 11/06/2020).  Cathlean Cower, MD 05/09/2020 8:49 AM [facility]

## 2020-05-09 NOTE — Assessment & Plan Note (Signed)
BP Readings from Last 3 Encounters:  05/09/20 130/74  04/26/19 130/80  10/24/18 122/78   Stable, pt to continue medical treatment  - toprol    Current Outpatient Medications (Cardiovascular):  .  atorvastatin (LIPITOR) 40 MG tablet, TAKE 1 TABLET EVERY DAY .  metoprolol succinate (TOPROL-XL) 50 MG 24 hr tablet, TAKE 1 TABLET EVERY DAY   Current Outpatient Medications (Respiratory):  .  fluticasone (FLONASE) 50 MCG/ACT nasal spray, USE 2 SPRAYS IN EACH NOSTRIL EVERY DAY (SUBSTITUTED FOR  FLONASE) .  montelukast (SINGULAIR) 10 MG tablet, TAKE 1 TABLET EVERY DAY   Current Outpatient Medications (Analgesics):  .  aspirin 81 MG tablet, Take 81 mg by mouth daily. Marland Kitchen  ibuprofen (ADVIL,MOTRIN) 200 MG tablet, Take 200 mg by mouth every 6 (six) hours as needed for headache or moderate pain.     Current Outpatient Medications (Other):  Marland Kitchen  Vitamin D, Ergocalciferol, (DRISDOL) 1.25 MG (50000 UNIT) CAPS capsule, Take 1 capsule (50,000 Units total) by mouth every 7 (seven) days. (Patient not taking: Reported on 05/09/2020)   Facility-Administered Medications Ordered in Other Visits (Other):  .  gentamicin (GARAMYCIN) 500 mg in dextrose 5 % 100 mL IVPB No current facility-administered medications for this visit.

## 2020-05-09 NOTE — Patient Instructions (Addendum)
Please continue all other medications as before, and refills have been done if requested.  Please have the pharmacy call with any other refills you may need.  Please continue your efforts at being more active, low cholesterol diet, and weight control.  You are otherwise up to date with prevention measures today.  Please keep your appointments with your specialists as you may have planned  Please make an Appointment to return in 6 months, or sooner if needed, to follow the kidney function closer than we have in the past

## 2020-05-09 NOTE — Assessment & Plan Note (Signed)
Last vitamin D Lab Results  Component Value Date   VD25OH 22.83 (L) 05/05/2020   Low, to start vitd d3 2000 u oral replacement

## 2020-05-09 NOTE — Assessment & Plan Note (Signed)
Lab Results  Component Value Date   HGBA1C 5.8 05/05/2020   Stable, pt to continue current medical treatment  - diet

## 2020-05-09 NOTE — Assessment & Plan Note (Signed)
Lab Results  Component Value Date   CREATININE 1.30 05/05/2020   Stable overall, cont to avoid nephrotoxins, f/u 6 mo

## 2020-05-22 DIAGNOSIS — S66022D Laceration of long flexor muscle, fascia and tendon of left thumb at wrist and hand level, subsequent encounter: Secondary | ICD-10-CM | POA: Diagnosis not present

## 2020-05-22 DIAGNOSIS — S6432XD Injury of digital nerve of left thumb, subsequent encounter: Secondary | ICD-10-CM | POA: Diagnosis not present

## 2020-05-22 DIAGNOSIS — R200 Anesthesia of skin: Secondary | ICD-10-CM | POA: Diagnosis not present

## 2020-05-22 DIAGNOSIS — R202 Paresthesia of skin: Secondary | ICD-10-CM | POA: Diagnosis not present

## 2020-05-22 DIAGNOSIS — M25642 Stiffness of left hand, not elsewhere classified: Secondary | ICD-10-CM | POA: Diagnosis not present

## 2020-05-22 DIAGNOSIS — M79645 Pain in left finger(s): Secondary | ICD-10-CM | POA: Diagnosis not present

## 2020-05-30 DIAGNOSIS — M25642 Stiffness of left hand, not elsewhere classified: Secondary | ICD-10-CM | POA: Diagnosis not present

## 2020-05-30 DIAGNOSIS — S66022D Laceration of long flexor muscle, fascia and tendon of left thumb at wrist and hand level, subsequent encounter: Secondary | ICD-10-CM | POA: Diagnosis not present

## 2020-05-30 DIAGNOSIS — S6432XD Injury of digital nerve of left thumb, subsequent encounter: Secondary | ICD-10-CM | POA: Diagnosis not present

## 2020-05-30 DIAGNOSIS — M79645 Pain in left finger(s): Secondary | ICD-10-CM | POA: Diagnosis not present

## 2020-06-05 DIAGNOSIS — R202 Paresthesia of skin: Secondary | ICD-10-CM | POA: Diagnosis not present

## 2020-06-05 DIAGNOSIS — M79645 Pain in left finger(s): Secondary | ICD-10-CM | POA: Diagnosis not present

## 2020-06-05 DIAGNOSIS — S6432XD Injury of digital nerve of left thumb, subsequent encounter: Secondary | ICD-10-CM | POA: Diagnosis not present

## 2020-06-05 DIAGNOSIS — R200 Anesthesia of skin: Secondary | ICD-10-CM | POA: Diagnosis not present

## 2020-06-05 DIAGNOSIS — M25642 Stiffness of left hand, not elsewhere classified: Secondary | ICD-10-CM | POA: Diagnosis not present

## 2020-06-05 DIAGNOSIS — S66022D Laceration of long flexor muscle, fascia and tendon of left thumb at wrist and hand level, subsequent encounter: Secondary | ICD-10-CM | POA: Diagnosis not present

## 2020-06-17 DIAGNOSIS — R202 Paresthesia of skin: Secondary | ICD-10-CM | POA: Diagnosis not present

## 2020-06-17 DIAGNOSIS — S66022D Laceration of long flexor muscle, fascia and tendon of left thumb at wrist and hand level, subsequent encounter: Secondary | ICD-10-CM | POA: Diagnosis not present

## 2020-06-17 DIAGNOSIS — M25642 Stiffness of left hand, not elsewhere classified: Secondary | ICD-10-CM | POA: Diagnosis not present

## 2020-06-17 DIAGNOSIS — R200 Anesthesia of skin: Secondary | ICD-10-CM | POA: Diagnosis not present

## 2020-06-17 DIAGNOSIS — M79645 Pain in left finger(s): Secondary | ICD-10-CM | POA: Diagnosis not present

## 2020-06-17 DIAGNOSIS — S6432XD Injury of digital nerve of left thumb, subsequent encounter: Secondary | ICD-10-CM | POA: Diagnosis not present

## 2020-06-24 DIAGNOSIS — M25642 Stiffness of left hand, not elsewhere classified: Secondary | ICD-10-CM | POA: Diagnosis not present

## 2020-06-24 DIAGNOSIS — R200 Anesthesia of skin: Secondary | ICD-10-CM | POA: Diagnosis not present

## 2020-06-24 DIAGNOSIS — R202 Paresthesia of skin: Secondary | ICD-10-CM | POA: Diagnosis not present

## 2020-06-24 DIAGNOSIS — M79645 Pain in left finger(s): Secondary | ICD-10-CM | POA: Diagnosis not present

## 2020-06-24 DIAGNOSIS — S6432XD Injury of digital nerve of left thumb, subsequent encounter: Secondary | ICD-10-CM | POA: Diagnosis not present

## 2020-06-24 DIAGNOSIS — S66022D Laceration of long flexor muscle, fascia and tendon of left thumb at wrist and hand level, subsequent encounter: Secondary | ICD-10-CM | POA: Diagnosis not present

## 2020-07-03 DIAGNOSIS — R202 Paresthesia of skin: Secondary | ICD-10-CM | POA: Diagnosis not present

## 2020-07-03 DIAGNOSIS — S66022D Laceration of long flexor muscle, fascia and tendon of left thumb at wrist and hand level, subsequent encounter: Secondary | ICD-10-CM | POA: Diagnosis not present

## 2020-07-03 DIAGNOSIS — R200 Anesthesia of skin: Secondary | ICD-10-CM | POA: Diagnosis not present

## 2020-07-03 DIAGNOSIS — S6432XD Injury of digital nerve of left thumb, subsequent encounter: Secondary | ICD-10-CM | POA: Diagnosis not present

## 2020-07-03 DIAGNOSIS — M25642 Stiffness of left hand, not elsewhere classified: Secondary | ICD-10-CM | POA: Diagnosis not present

## 2020-07-03 DIAGNOSIS — M79645 Pain in left finger(s): Secondary | ICD-10-CM | POA: Diagnosis not present

## 2020-07-05 DIAGNOSIS — J018 Other acute sinusitis: Secondary | ICD-10-CM | POA: Diagnosis not present

## 2020-07-08 DIAGNOSIS — N5201 Erectile dysfunction due to arterial insufficiency: Secondary | ICD-10-CM | POA: Diagnosis not present

## 2020-07-08 DIAGNOSIS — N201 Calculus of ureter: Secondary | ICD-10-CM | POA: Diagnosis not present

## 2020-07-08 DIAGNOSIS — N4341 Spermatocele of epididymis, single: Secondary | ICD-10-CM | POA: Diagnosis not present

## 2020-07-08 DIAGNOSIS — R351 Nocturia: Secondary | ICD-10-CM | POA: Diagnosis not present

## 2020-07-15 DIAGNOSIS — R200 Anesthesia of skin: Secondary | ICD-10-CM | POA: Diagnosis not present

## 2020-07-15 DIAGNOSIS — M25642 Stiffness of left hand, not elsewhere classified: Secondary | ICD-10-CM | POA: Diagnosis not present

## 2020-07-15 DIAGNOSIS — S6432XD Injury of digital nerve of left thumb, subsequent encounter: Secondary | ICD-10-CM | POA: Diagnosis not present

## 2020-07-15 DIAGNOSIS — S66022D Laceration of long flexor muscle, fascia and tendon of left thumb at wrist and hand level, subsequent encounter: Secondary | ICD-10-CM | POA: Diagnosis not present

## 2020-07-15 DIAGNOSIS — R202 Paresthesia of skin: Secondary | ICD-10-CM | POA: Diagnosis not present

## 2020-07-15 DIAGNOSIS — M79645 Pain in left finger(s): Secondary | ICD-10-CM | POA: Diagnosis not present

## 2020-07-22 DIAGNOSIS — M25642 Stiffness of left hand, not elsewhere classified: Secondary | ICD-10-CM | POA: Diagnosis not present

## 2020-07-22 DIAGNOSIS — R202 Paresthesia of skin: Secondary | ICD-10-CM | POA: Diagnosis not present

## 2020-07-22 DIAGNOSIS — S66022D Laceration of long flexor muscle, fascia and tendon of left thumb at wrist and hand level, subsequent encounter: Secondary | ICD-10-CM | POA: Diagnosis not present

## 2020-07-22 DIAGNOSIS — R200 Anesthesia of skin: Secondary | ICD-10-CM | POA: Diagnosis not present

## 2020-07-22 DIAGNOSIS — S6432XD Injury of digital nerve of left thumb, subsequent encounter: Secondary | ICD-10-CM | POA: Diagnosis not present

## 2020-07-30 DIAGNOSIS — S6432XD Injury of digital nerve of left thumb, subsequent encounter: Secondary | ICD-10-CM | POA: Diagnosis not present

## 2020-07-30 DIAGNOSIS — R200 Anesthesia of skin: Secondary | ICD-10-CM | POA: Diagnosis not present

## 2020-07-30 DIAGNOSIS — S66022D Laceration of long flexor muscle, fascia and tendon of left thumb at wrist and hand level, subsequent encounter: Secondary | ICD-10-CM | POA: Diagnosis not present

## 2020-07-30 DIAGNOSIS — R202 Paresthesia of skin: Secondary | ICD-10-CM | POA: Diagnosis not present

## 2020-07-30 DIAGNOSIS — M79645 Pain in left finger(s): Secondary | ICD-10-CM | POA: Diagnosis not present

## 2020-07-30 DIAGNOSIS — M25642 Stiffness of left hand, not elsewhere classified: Secondary | ICD-10-CM | POA: Diagnosis not present

## 2020-08-06 DIAGNOSIS — S66022D Laceration of long flexor muscle, fascia and tendon of left thumb at wrist and hand level, subsequent encounter: Secondary | ICD-10-CM | POA: Diagnosis not present

## 2020-08-06 DIAGNOSIS — S6432XD Injury of digital nerve of left thumb, subsequent encounter: Secondary | ICD-10-CM | POA: Diagnosis not present

## 2020-08-06 DIAGNOSIS — M25642 Stiffness of left hand, not elsewhere classified: Secondary | ICD-10-CM | POA: Diagnosis not present

## 2020-08-06 DIAGNOSIS — M79645 Pain in left finger(s): Secondary | ICD-10-CM | POA: Diagnosis not present

## 2020-08-06 DIAGNOSIS — R202 Paresthesia of skin: Secondary | ICD-10-CM | POA: Diagnosis not present

## 2020-08-06 DIAGNOSIS — R200 Anesthesia of skin: Secondary | ICD-10-CM | POA: Diagnosis not present

## 2020-08-14 DIAGNOSIS — M25531 Pain in right wrist: Secondary | ICD-10-CM | POA: Diagnosis not present

## 2020-08-14 DIAGNOSIS — S6432XD Injury of digital nerve of left thumb, subsequent encounter: Secondary | ICD-10-CM | POA: Diagnosis not present

## 2020-08-14 DIAGNOSIS — M25532 Pain in left wrist: Secondary | ICD-10-CM | POA: Diagnosis not present

## 2020-09-18 DIAGNOSIS — L819 Disorder of pigmentation, unspecified: Secondary | ICD-10-CM | POA: Diagnosis not present

## 2020-09-18 DIAGNOSIS — L821 Other seborrheic keratosis: Secondary | ICD-10-CM | POA: Diagnosis not present

## 2020-09-18 DIAGNOSIS — L814 Other melanin hyperpigmentation: Secondary | ICD-10-CM | POA: Diagnosis not present

## 2020-09-18 DIAGNOSIS — L570 Actinic keratosis: Secondary | ICD-10-CM | POA: Diagnosis not present

## 2020-09-18 DIAGNOSIS — D1801 Hemangioma of skin and subcutaneous tissue: Secondary | ICD-10-CM | POA: Diagnosis not present

## 2020-09-18 DIAGNOSIS — L918 Other hypertrophic disorders of the skin: Secondary | ICD-10-CM | POA: Diagnosis not present

## 2020-10-13 DIAGNOSIS — Z23 Encounter for immunization: Secondary | ICD-10-CM | POA: Diagnosis not present

## 2020-11-07 ENCOUNTER — Ambulatory Visit: Payer: Medicare Other | Admitting: Internal Medicine

## 2020-11-12 ENCOUNTER — Encounter: Payer: Self-pay | Admitting: Internal Medicine

## 2020-11-12 ENCOUNTER — Ambulatory Visit (INDEPENDENT_AMBULATORY_CARE_PROVIDER_SITE_OTHER): Payer: Medicare Other | Admitting: Internal Medicine

## 2020-11-12 ENCOUNTER — Other Ambulatory Visit: Payer: Self-pay

## 2020-11-12 VITALS — BP 118/82 | HR 54 | Temp 98.6°F | Ht 76.0 in | Wt 296.0 lb

## 2020-11-12 DIAGNOSIS — N401 Enlarged prostate with lower urinary tract symptoms: Secondary | ICD-10-CM

## 2020-11-12 DIAGNOSIS — E559 Vitamin D deficiency, unspecified: Secondary | ICD-10-CM

## 2020-11-12 DIAGNOSIS — E7800 Pure hypercholesterolemia, unspecified: Secondary | ICD-10-CM | POA: Diagnosis not present

## 2020-11-12 DIAGNOSIS — R351 Nocturia: Secondary | ICD-10-CM | POA: Diagnosis not present

## 2020-11-12 DIAGNOSIS — N1831 Chronic kidney disease, stage 3a: Secondary | ICD-10-CM | POA: Diagnosis not present

## 2020-11-12 DIAGNOSIS — I10 Essential (primary) hypertension: Secondary | ICD-10-CM

## 2020-11-12 DIAGNOSIS — F17200 Nicotine dependence, unspecified, uncomplicated: Secondary | ICD-10-CM | POA: Diagnosis not present

## 2020-11-12 DIAGNOSIS — R739 Hyperglycemia, unspecified: Secondary | ICD-10-CM

## 2020-11-12 LAB — BASIC METABOLIC PANEL
BUN: 20 mg/dL (ref 6–23)
CO2: 23 mEq/L (ref 19–32)
Calcium: 9.5 mg/dL (ref 8.4–10.5)
Chloride: 104 mEq/L (ref 96–112)
Creatinine, Ser: 1.27 mg/dL (ref 0.40–1.50)
GFR: 56.17 mL/min — ABNORMAL LOW (ref >60.00)
Glucose, Bld: 107 mg/dL — ABNORMAL HIGH (ref 70–99)
Potassium: 4.3 mEq/L (ref 3.5–5.1)
Sodium: 138 mEq/L (ref 135–145)

## 2020-11-12 LAB — HEMOGLOBIN A1C: Hgb A1c MFr Bld: 6 % (ref 4.6–6.5)

## 2020-11-12 LAB — URINALYSIS, ROUTINE W REFLEX MICROSCOPIC
Bilirubin Urine: NEGATIVE
Hgb urine dipstick: NEGATIVE
Ketones, ur: NEGATIVE
Leukocytes,Ua: NEGATIVE
Nitrite: NEGATIVE
RBC / HPF: NONE SEEN (ref 0–?)
Specific Gravity, Urine: 1.02 (ref 1.000–1.030)
Total Protein, Urine: NEGATIVE
Urine Glucose: NEGATIVE
Urobilinogen, UA: 0.2 (ref 0.0–1.0)
WBC, UA: NONE SEEN (ref 0–?)
pH: 6 (ref 5.0–8.0)

## 2020-11-12 LAB — HEPATIC FUNCTION PANEL
ALT: 36 U/L (ref 0–53)
AST: 27 U/L (ref 0–37)
Albumin: 4.3 g/dL (ref 3.5–5.2)
Alkaline Phosphatase: 80 U/L (ref 39–117)
Bilirubin, Direct: 0.2 mg/dL (ref 0.0–0.3)
Total Bilirubin: 0.6 mg/dL (ref 0.2–1.2)
Total Protein: 7.1 g/dL (ref 6.0–8.3)

## 2020-11-12 LAB — LIPID PANEL
Cholesterol: 122 mg/dL (ref 0–200)
HDL: 40.1 mg/dL (ref >39.00)
LDL Cholesterol: 62 mg/dL (ref 0–99)
NonHDL: 82.31
Total CHOL/HDL Ratio: 3
Triglycerides: 101 mg/dL (ref 0.0–149.0)
VLDL: 20.2 mg/dL (ref 0.0–40.0)

## 2020-11-12 LAB — VITAMIN D 25 HYDROXY (VIT D DEFICIENCY, FRACTURES): VITD: 26.75 ng/mL — ABNORMAL LOW (ref 30.00–100.00)

## 2020-11-12 MED ORDER — TAMSULOSIN HCL 0.4 MG PO CAPS: 0.4000 mg | ORAL_CAPSULE | Freq: Every day | ORAL | 3 refills | Status: DC

## 2020-11-12 NOTE — Assessment & Plan Note (Signed)
Last vitamin D Lab Results  Component Value Date   VD25OH 22.83 (L) 05/05/2020   Low, to start oral replacement

## 2020-11-12 NOTE — Patient Instructions (Addendum)
Please look into the Shingrix shingles shot at the local [facility]  Ok to start the flomax in the evening every day  Please continue all other medications as before, and refills have been done if requested.  Please have the pharmacy call with any other refills you may need.  Please continue your efforts at being more active, low cholesterol diet, and weight control.  Please keep your appointments with your specialists as you may have planned  We have discussed the Cardiac CT Score test to measure the calcification level (if any) in your heart arteries.  This test has been ordered in our [place], so please call [facility] directly, as they prefer this, at [phone] to be scheduled.  Please go to the LAB at the blood drawing area for the tests to be done  You will be contacted by phone if any changes need to be made immediately.  Otherwise, you will receive a letter about your results with an explanation, but please check with MyChart first.  Please remember to sign up for MyChart if you have not done so, as this will be important to you in the future with finding out test results, communicating by private email, and scheduling acute appointments online when needed.  Please make an Appointment to return in 6 months, or sooner if needed

## 2020-11-12 NOTE — Progress Notes (Signed)
Patient ID: Antonio Reyes, male   DOB: [birth year], 75 y.o.   MRN: [id number]        Chief Complaint: follow up HTN, HLD and hyperglycemia, low vit d, ckd, bph with nocturia       HPI:  Antonio Reyes is a 75 y.o. male here overall doing well.  Pt denies chest pain, increased sob or doe, wheezing, orthopnea, PND, increased LE swelling, palpitations, dizziness or syncope.   Pt denies polydipsia, polyuria, or new focal neuro s/s.   Pt denies fever, wt loss, night sweats, loss of appetite, or other constitutional symptoms    Taking Vit D every once in a while.  Volunteers fo [employer] and maintains the property and his yard as well, trying to stay active somewhat.  Has had some precancerous skin lesion taken off.  Has 3-4 mo worsening mild reduced urine flow and at least 3 time nocturia per night, o/w Denies urinary symptoms such as dysuria, frequency, urgency, flank pain, hematuria or n/v, fever, chills.  No other new complaints Wt Readings from Last 3 Encounters:  11/12/20 296 lb (134.3 kg)  05/09/20 294 lb (133.4 kg)  04/26/19 283 lb (128.4 kg)   BP Readings from Last 3 Encounters:  11/12/20 118/82  05/09/20 130/74  04/26/19 130/80         Past Medical History:  Diagnosis Date   Abdominal hernia    Allergic rhinitis    Anxiety    pt states no history of anxiety   Aortic atherosclerosis ([place]) 04/25/2018   noted on CT renal   Benign neoplasm of stomach    BPH (benign prostatic hypertrophy)    pt unaware   C6 cervical fracture (HCC)    C7,C8   Cancer ([place]) 2000   skin cancer   left ear   Chronic sinusitis    CKD (chronic kidney disease), stage III ([place])    pt unaware   Diverticulitis    Diverticulosis 03/27/2015   Moderate, Noted on colonoscopy   Dizziness    ED (erectile dysfunction)    GERD (gastroesophageal reflux disease)    History of herpes genitalis    History of kidney stones 04/25/2018   4 mm left UVJ stone, 10 mm bladder stone, noted on CT renal   HLD  (hyperlipidemia)    HTN (hypertension)    Hydronephrosis    Left, Moderate, noted on CT renal   Inguinal hernia 04/25/2018   Bilateral, noted on CT renal, pt unaware   Low back pain    Migraine    hx of   Neck pain, bilateral    Obesity    Polycythemia 2000   per Dr. Elsworth Soho   Rotator cuff tear    Sleep apnea    CPAP   Tubular adenoma of colon 05/2008   Past Surgical History:  Procedure Laterality Date   COLONOSCOPY  03/27/2015   CYSTOSCOPY WITH RETROGRADE PYELOGRAM, URETEROSCOPY AND STENT PLACEMENT Left 05/31/2018   Procedure: CYSTOSCOPY WITH RETROGRADE PYELOGRAM, URETEROSCOPY AND STENT PLACEMENT;  Surgeon: Alexis Frock, MD;  Location: [facility];  Service: Urology;  Laterality: Left;  1 HR   HERNIA REPAIR     umbilical with mesh   HOLMIUM LASER APPLICATION Left [id number]   Procedure: HOLMIUM LASER APPLICATION;  Surgeon: Alexis Frock, MD;  Location: [facility];  Service: Urology;  Laterality: Left;   KNEE SURGERY Right    mensicus   2011   POLYPECTOMY     UPPER GI ENDOSCOPY  06/16/2005  reports that he has been smoking cigars. He has never used smokeless tobacco. He reports current alcohol use of about 1.0 standard drink per week. He reports that he does not use drugs. family history includes Colon cancer (age of onset: 60) in his mother; Pancreatic cancer in his paternal grandfather. Allergies  Allergen Reactions   [place] Other (See Comments)   Pollen Extract     Other reaction(s): Other (See Comments)   Current Outpatient Medications on File Prior to Visit  Medication Sig Dispense Refill   aspirin 81 MG tablet Take 81 mg by mouth daily.     atorvastatin (LIPITOR) 40 MG tablet TAKE 1 TABLET EVERY DAY 90 tablet 3   fluticasone (FLONASE) 50 MCG/ACT nasal spray USE 2 SPRAYS IN EACH NOSTRIL EVERY DAY (SUBSTITUTED FOR  FLONASE) 48 g 3   ibuprofen (ADVIL,MOTRIN) 200 MG tablet Take 200 mg by mouth every 6 (six) hours as needed for headache or moderate pain.     metoprolol  succinate (TOPROL-XL) 50 MG 24 hr tablet TAKE 1 TABLET EVERY DAY 90 tablet 3   montelukast (SINGULAIR) 10 MG tablet TAKE 1 TABLET EVERY DAY 90 tablet 3   Current Facility-Administered Medications on File Prior to Visit  Medication Dose Route Frequency Provider Last Rate Last Admin   gentamicin (GARAMYCIN) 500 mg in dextrose 5 % 100 mL IVPB  500 mg Intravenous Once Alexis Frock, MD            ROS:  All others reviewed and negative.  Objective        PE:  BP 118/82 (BP Location: Left Arm, Patient Position: Sitting, Cuff Size: Normal)   Pulse (!) 54   Temp 98.6 F (37 C) (Oral)   Ht '6\' 4"'$  (1.93 m)   Wt 296 lb (134.3 kg)   SpO2 96%   BMI 36.03 kg/m                 Constitutional: Pt appears in NAD               HENT: Head: NCAT.                Right Ear: External ear normal.                 Left Ear: External ear normal.                Eyes: . Pupils are equal, round, and reactive to light. Conjunctivae and EOM are normal               Nose: without d/c or deformity               Neck: Neck supple. Gross normal ROM               Cardiovascular: Normal rate and regular rhythm.                 Pulmonary/Chest: Effort normal and breath sounds without rales or wheezing.                Abd:  Soft, NT, ND, + BS, no organomegaly               Neurological: Pt is alert. At baseline orientation, motor grossly intact               Skin: Skin is warm. No rashes, no other new lesions, LE edema - none  Psychiatric: Pt behavior is normal without agitation   Micro: none  Cardiac tracings I have personally interpreted today:  none  Pertinent Radiological findings (summarize): none   Lab Results  Component Value Date   WBC 8.0 05/05/2020   HGB 17.1 (H) 05/05/2020   HCT 50.6 05/05/2020   PLT 225.0 05/05/2020   GLUCOSE 107 (H) 11/12/2020   CHOL 122 11/12/2020   TRIG 101.0 11/12/2020   HDL 40.10 11/12/2020   LDLDIRECT 148.7 04/12/2008   LDLCALC 62 11/12/2020   ALT 36  11/12/2020   AST 27 11/12/2020   NA 138 11/12/2020   K 4.3 11/12/2020   CL 104 11/12/2020   CREATININE 1.27 11/12/2020   BUN 20 11/12/2020   CO2 23 11/12/2020   TSH 3.26 05/05/2020   PSA 1.09 05/05/2020   HGBA1C 6.0 11/12/2020   Assessment/Plan:  Antonio Reyes is a 75 y.o. White or Caucasian [1] male with  has a past medical history of Abdominal hernia, Allergic rhinitis, Anxiety, Aortic atherosclerosis ([place]) (04/25/2018), Benign neoplasm of stomach, BPH (benign prostatic hypertrophy), C6 cervical fracture ([place]), Cancer ([place]) (2000), Chronic sinusitis, CKD (chronic kidney disease), stage III ([place]), Diverticulitis, Diverticulosis (03/27/2015), Dizziness, ED (erectile dysfunction), GERD (gastroesophageal reflux disease), History of herpes genitalis, History of kidney stones (04/25/2018), HLD (hyperlipidemia), HTN (hypertension), Hydronephrosis, Inguinal hernia (04/25/2018), Low back pain, Migraine, Neck pain, bilateral, Obesity, Polycythemia (2000), Rotator cuff tear, Sleep apnea, and Tubular adenoma of colon (05/2008).  Vitamin D deficiency Last vitamin D Lab Results  Component Value Date   VD25OH 22.83 (L) 05/05/2020   Low, to start oral replacement   Smoker Pt counsled to quit, pt not ready  Hyperglycemia Lab Results  Component Value Date   HGBA1C 6.0 11/12/2020   Stable, pt to continue current medical treatment  - diet   HYPERCHOLESTEROLEMIA Lab Results  Component Value Date   LDLCALC 62 11/12/2020   Stable, pt to continue current statin lipitor 40, also for CT cardiac score   Essential hypertension BP Readings from Last 3 Encounters:  11/12/20 118/82  05/09/20 130/74  04/26/19 130/80   Stable, pt to continue medical treatment toprol   CKD (chronic kidney disease) stage 3, GFR 30-59 ml/min (HCC) Lab Results  Component Value Date   CREATININE 1.27 11/12/2020   Stable overall, cont to avoid nephrotoxins   BPH associated with nocturia With recent worsening  symptoms, ok for flomax qhs Followup: Return in about 6 months (around 05/15/2021).  Cathlean Cower, MD 11/16/2020 1:21 PM [facility]

## 2020-11-14 IMAGING — CT CT RENAL STONE PROTOCOL
1 of 2 series · 15 of 32 positions shown, 19 images · non-contrast
Comparison: None.

CLINICAL DATA: Known urinary tract stone. Microhematuria. Right
flank pain

EXAM:
CT ABDOMEN AND PELVIS WITHOUT CONTRAST
TECHNIQUE: Multidetector CT imaging of the abdomen and pelvis was performed
following the standard protocol without IV contrast.

[Series 2: stone study 5.0 i30f 1 · axial · 0.85mm/px · z∈[+746,+1206]mm · 15 of 102 slices shown, 19 images]
[im 5/102  soft-tissue]
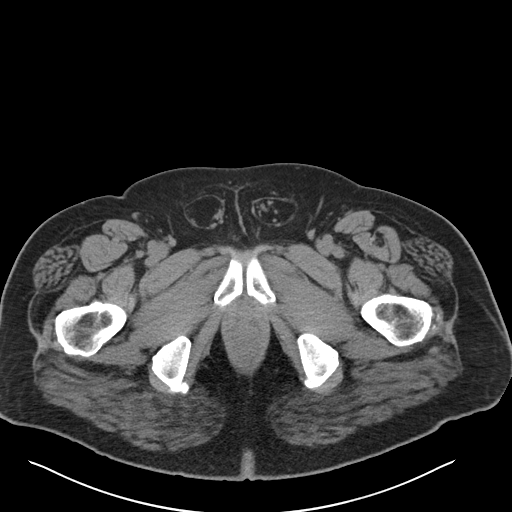
[im 5/102  bone]
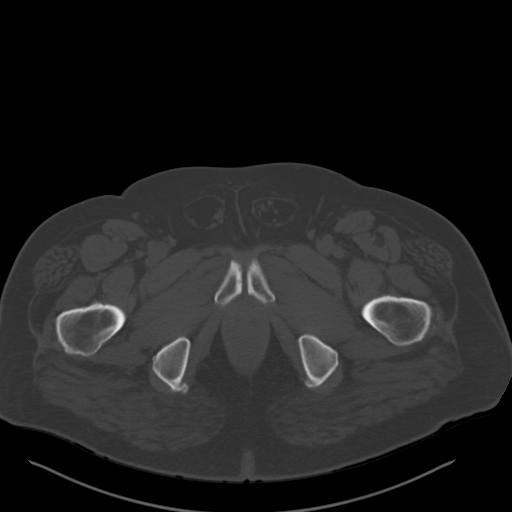
[im 13/102  soft-tissue]
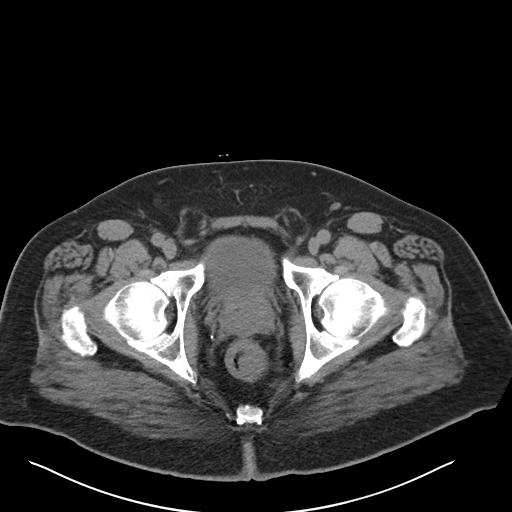
[im 22/102  soft-tissue]
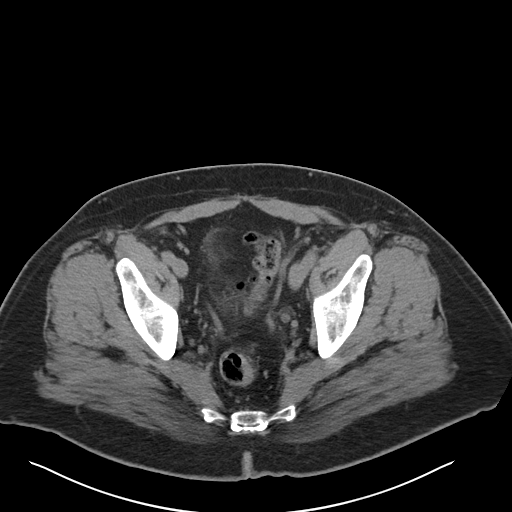
[im 30/102  soft-tissue]
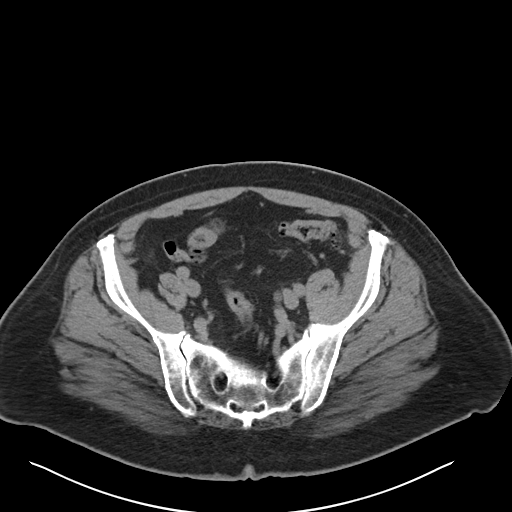
[im 34/102  soft-tissue]
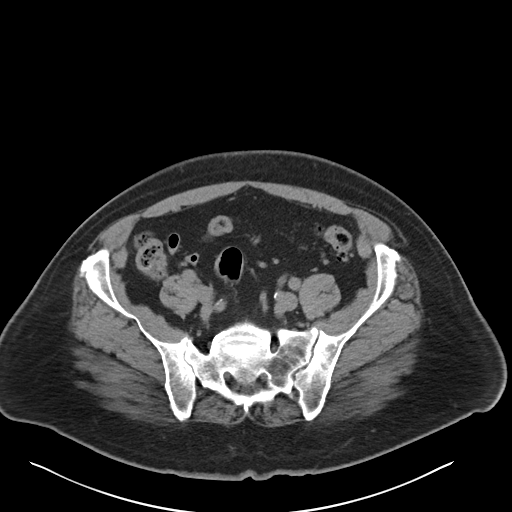
[im 43/102  soft-tissue]
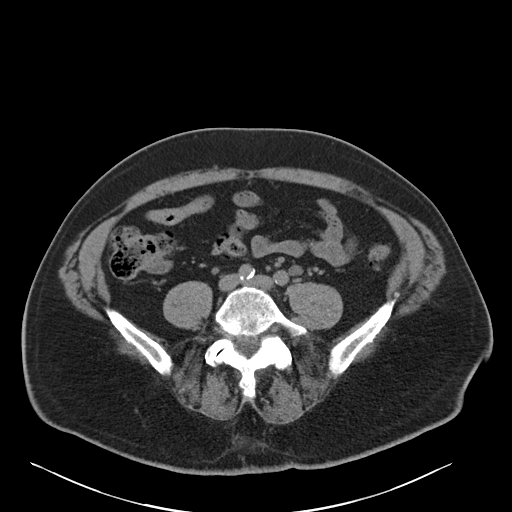
[im 51/102  soft-tissue]
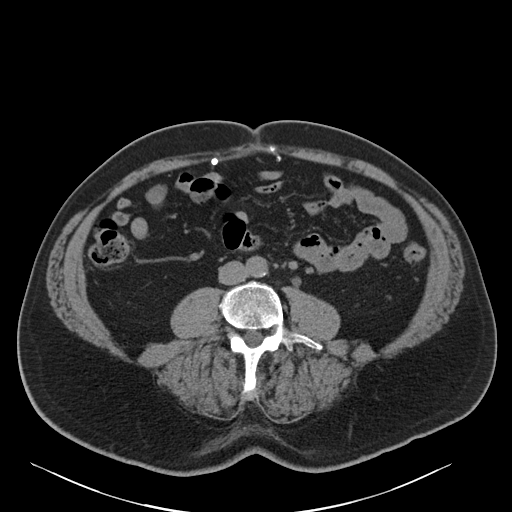
[im 59/102  soft-tissue]
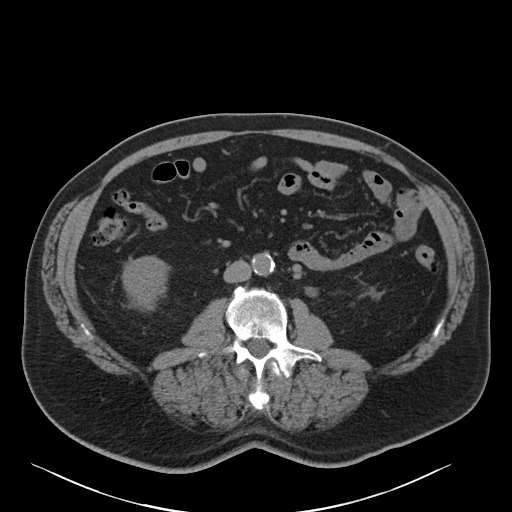
[im 68/102  soft-tissue]
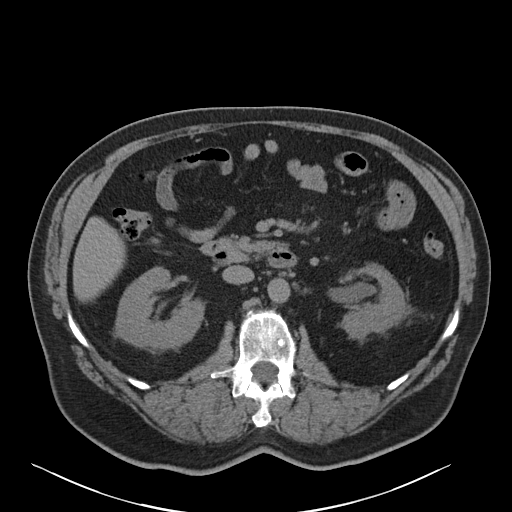
[im 68/102  bone]
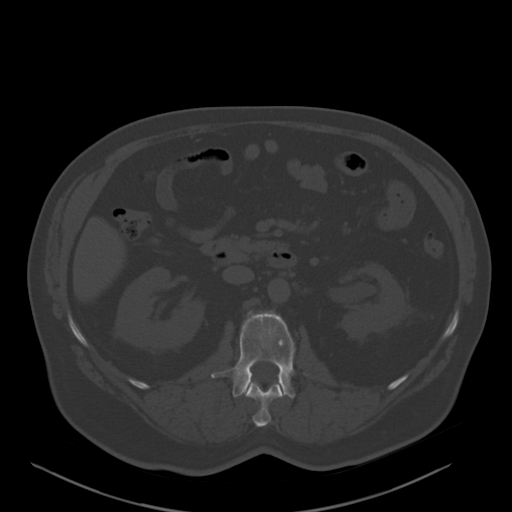
[im 72/102  soft-tissue]
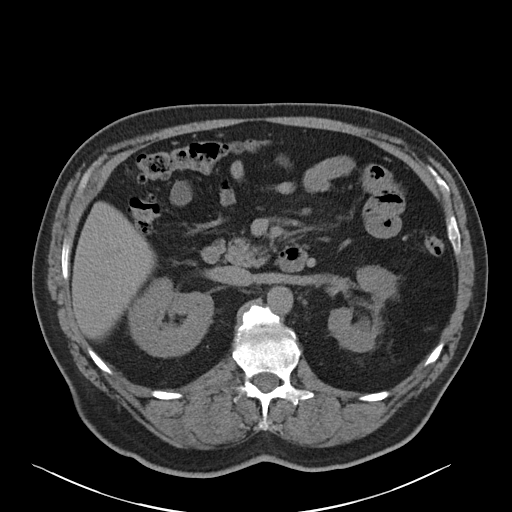
[im 80/102  soft-tissue]
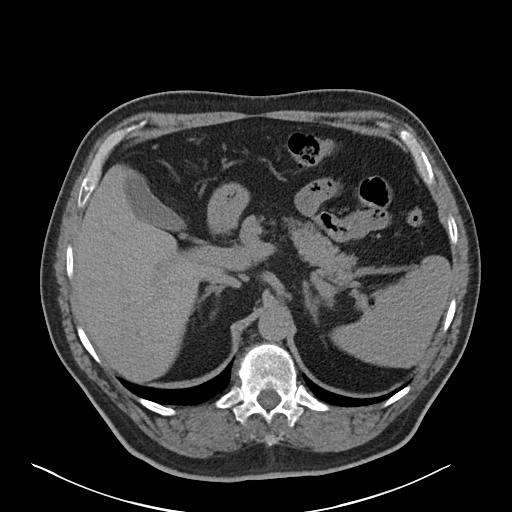
[im 85/102  lung]
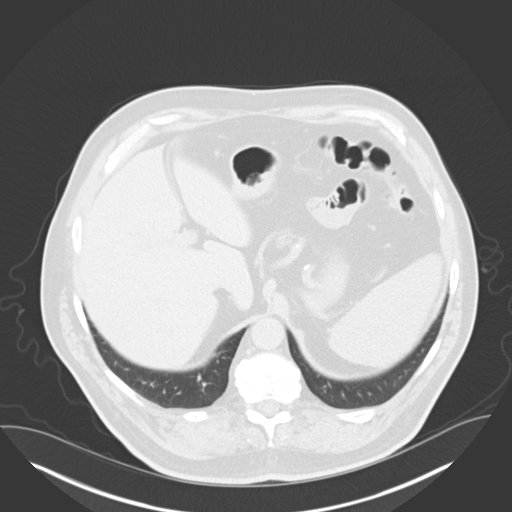
[im 89/102  soft-tissue]
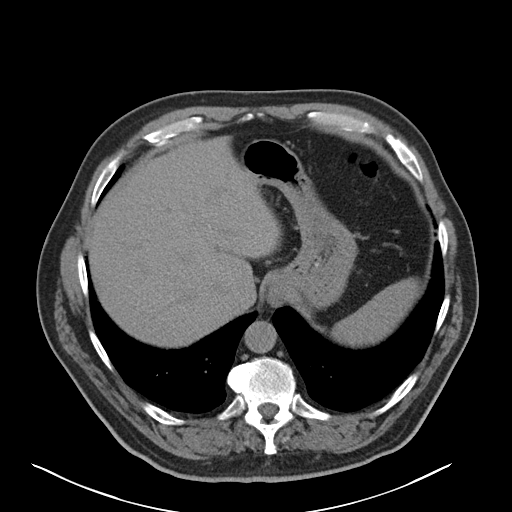
[im 89/102  lung]
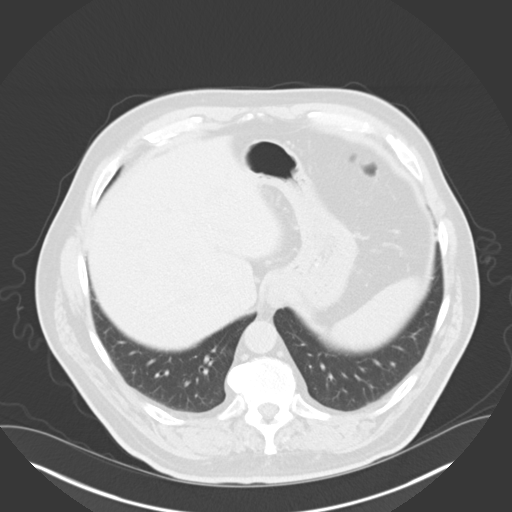
[im 93/102  lung]
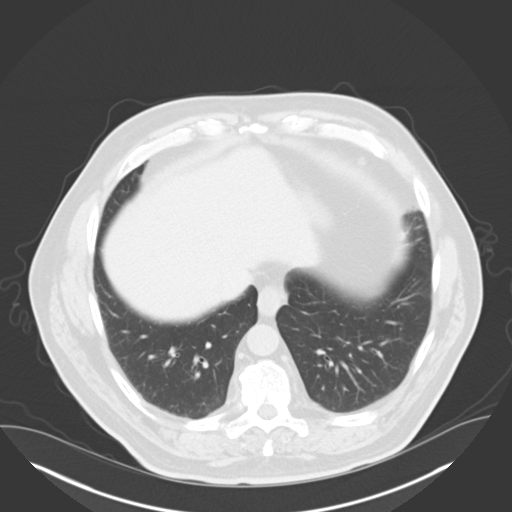
[im 97/102  soft-tissue]
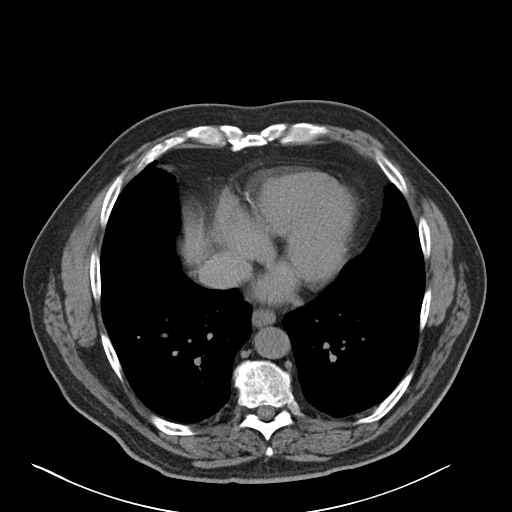
[im 97/102  lung]
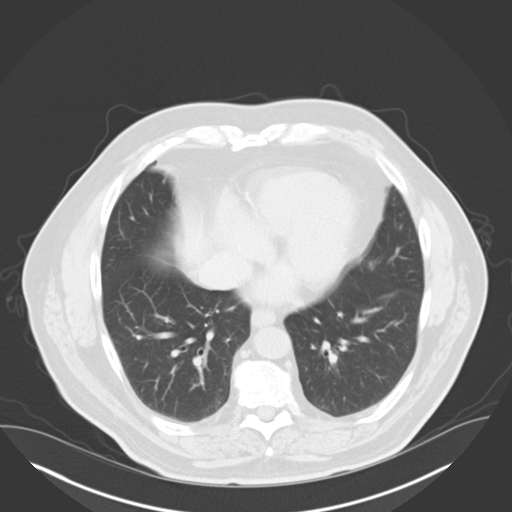

[15 of 32 positions shown; findings below may reference images not displayed]

FINDINGS: Lower chest: Lung bases are clear. No effusions. Heart is normal
size.

Hepatobiliary: No focal hepatic abnormality. Gallbladder
unremarkable.

Pancreas: No focal abnormality or ductal dilatation.

Spleen: No focal abnormality.  Normal size.

Adrenals/Urinary Tract: Moderate left hydronephrosis and hydroureter
due to a 4 mm left UVJ stone. 10 mm bladder stone noted layering
dependently near the left UVJ. No hydronephrosis or stones on the
right. Scarring throughout the left kidney. Adrenal glands
unremarkable.

Stomach/Bowel: Colonic diverticulosis. No active diverticulitis.
Stomach and small bowel decompressed, unremarkable.

Vascular/Lymphatic: Aortic atherosclerosis. No enlarged abdominal or
pelvic lymph nodes.

Reproductive: Mild prostate enlargement with central calcifications.

Other: No free fluid or free air. Bilateral inguinal hernias
containing fat.

Musculoskeletal: No acute bony abnormality.
IMPRESSION: 4 mm distal left ureteral/UVJ stone with moderate left
hydronephrosis.

10 mm layering bladder stone.

Prostate enlargement.

Aortic atherosclerosis.

Colonic diverticulosis.

Bilateral inguinal hernias containing fat.

## 2020-11-16 ENCOUNTER — Encounter: Payer: Self-pay | Admitting: Internal Medicine

## 2020-11-16 DIAGNOSIS — F17200 Nicotine dependence, unspecified, uncomplicated: Secondary | ICD-10-CM | POA: Insufficient documentation

## 2020-11-16 NOTE — Assessment & Plan Note (Addendum)
Lab Results  Component Value Date   LDLCALC 62 11/12/2020   Stable, pt to continue current statin lipitor 40, also for CT cardiac score

## 2020-11-16 NOTE — Assessment & Plan Note (Signed)
With recent worsening symptoms, ok for flomax qhs

## 2020-11-16 NOTE — Assessment & Plan Note (Signed)
Lab Results  Component Value Date   HGBA1C 6.0 11/12/2020   Stable, pt to continue current medical treatment  - diet

## 2020-11-16 NOTE — Assessment & Plan Note (Signed)
Lab Results  Component Value Date   CREATININE 1.27 11/12/2020   Stable overall, cont to avoid nephrotoxins

## 2020-11-16 NOTE — Assessment & Plan Note (Signed)
BP Readings from Last 3 Encounters:  11/12/20 118/82  05/09/20 130/74  04/26/19 130/80   Stable, pt to continue medical treatment toprol

## 2020-11-16 NOTE — Assessment & Plan Note (Signed)
Pt counsled to quit, pt not ready °

## 2020-11-25 ENCOUNTER — Telehealth: Payer: Self-pay | Admitting: Internal Medicine

## 2020-11-25 NOTE — Telephone Encounter (Signed)
Yes, sorry, this is not able as we would need an evaluation and appropriate documentation to justify the need, or this might be seen as insurance fraud

## 2020-11-25 NOTE — Telephone Encounter (Signed)
Patient says he has a CT scan scheduled for next week & would like to speak w/ nurse or provider prior to visit  Please call [phone]

## 2020-11-25 NOTE — Telephone Encounter (Signed)
Patient states that he has been having a "cramp" in his groin area. Per patient, wanted to know if he could have imaging added on to the CT that he is already scheduled for. Advised patient that he would most likely need to be seen again for new problem but I would confirm with provider. Please advise

## 2020-11-26 NOTE — Telephone Encounter (Signed)
Pt has been scheduled for Mon 9/12 @ 2.40pm

## 2020-11-28 DIAGNOSIS — Z23 Encounter for immunization: Secondary | ICD-10-CM | POA: Diagnosis not present

## 2020-12-01 ENCOUNTER — Other Ambulatory Visit: Payer: Self-pay

## 2020-12-01 ENCOUNTER — Encounter: Payer: Self-pay | Admitting: Internal Medicine

## 2020-12-01 ENCOUNTER — Ambulatory Visit (INDEPENDENT_AMBULATORY_CARE_PROVIDER_SITE_OTHER): Payer: Medicare Other | Admitting: Internal Medicine

## 2020-12-01 VITALS — BP 122/62 | HR 63 | Temp 98.7°F | Ht 76.0 in | Wt 296.0 lb

## 2020-12-01 DIAGNOSIS — R739 Hyperglycemia, unspecified: Secondary | ICD-10-CM

## 2020-12-01 DIAGNOSIS — R1030 Lower abdominal pain, unspecified: Secondary | ICD-10-CM | POA: Diagnosis not present

## 2020-12-01 DIAGNOSIS — J309 Allergic rhinitis, unspecified: Secondary | ICD-10-CM | POA: Diagnosis not present

## 2020-12-01 DIAGNOSIS — R931 Abnormal findings on diagnostic imaging of heart and coronary circulation: Secondary | ICD-10-CM | POA: Diagnosis not present

## 2020-12-01 DIAGNOSIS — I10 Essential (primary) hypertension: Secondary | ICD-10-CM

## 2020-12-01 DIAGNOSIS — N1831 Chronic kidney disease, stage 3a: Secondary | ICD-10-CM

## 2020-12-01 NOTE — Progress Notes (Signed)
Patient ID: Antonio Reyes, male   DOB: September 17, 1945, 75 y.o.   MRN: [id number]        Chief Complaint: follow up lower abd crampy pains, allergies, htn hyperglycemia       HPI:  Antonio Reyes is a 75 y.o. male here with c/o 3 days onset bilateral lower abd pain, crampy at time, instermittent, mild but occasionally severe, and not assoc without radiation, n/v, worsening reflux, fever, chills, dysphagia, other bowel change or blood.  Nothing seems to make better or worse including BM's, urination, postion;  also, Does have several wks ongoing nasal allergy symptoms with clearish congestion, itch and sneezing, without fever, pain, ST, cough, swelling or wheezing.   Pt denies chest pain, increased sob or doe, wheezing, orthopnea, PND, increased LE swelling, palpitations, dizziness or syncope.   Pt denies polydipsia, polyuria, or new focal neuro s/s.      Denies urinary symptoms such as dysuria, frequency, urgency, flank pain, hematuria Wt Readings from Last 3 Encounters:  12/01/20 296 lb (134.3 kg)  11/12/20 296 lb (134.3 kg)  05/09/20 294 lb (133.4 kg)   BP Readings from Last 3 Encounters:  12/01/20 122/62  11/12/20 118/82  05/09/20 130/74         Past Medical History:  Diagnosis Date   Abdominal hernia    Allergic rhinitis    Anxiety    pt states no history of anxiety   Aortic atherosclerosis ([place]) 04/25/2018   noted on CT renal   Benign neoplasm of stomach    BPH (benign prostatic hypertrophy)    pt unaware   C6 cervical fracture (HCC)    C7,C8   Cancer ([place]) 2000   skin cancer   left ear   Chronic sinusitis    CKD (chronic kidney disease), stage III ([place])    pt unaware   Diverticulitis    Diverticulosis 03/27/2015   Moderate, Noted on colonoscopy   Dizziness    ED (erectile dysfunction)    GERD (gastroesophageal reflux disease)    History of herpes genitalis    History of kidney stones 04/25/2018   4 mm left UVJ stone, 10 mm bladder stone, noted on CT renal   HLD  (hyperlipidemia)    HTN (hypertension)    Hydronephrosis    Left, Moderate, noted on CT renal   Inguinal hernia 04/25/2018   Bilateral, noted on CT renal, pt unaware   Low back pain    Migraine    hx of   Neck pain, bilateral    Obesity    Polycythemia 2000   per Dr. Elsworth Soho   Rotator cuff tear    Sleep apnea    CPAP   Tubular adenoma of colon 05/2008   Past Surgical History:  Procedure Laterality Date   COLONOSCOPY  03/27/2015   CYSTOSCOPY WITH RETROGRADE PYELOGRAM, URETEROSCOPY AND STENT PLACEMENT Left 05/31/2018   Procedure: CYSTOSCOPY WITH RETROGRADE PYELOGRAM, URETEROSCOPY AND STENT PLACEMENT;  Surgeon: Alexis Frock, MD;  Location: [facility];  Service: Urology;  Laterality: Left;  1 HR   HERNIA REPAIR     umbilical with mesh   HOLMIUM LASER APPLICATION Left [id number]   Procedure: HOLMIUM LASER APPLICATION;  Surgeon: Alexis Frock, MD;  Location: [facility];  Service: Urology;  Laterality: Left;   KNEE SURGERY Right    mensicus   2011   POLYPECTOMY     UPPER GI ENDOSCOPY  06/16/2005    reports that he has been smoking cigars. He has never used smokeless  tobacco. He reports current alcohol use of about 1.0 standard drink per week. He reports that he does not use drugs. family history includes Colon cancer (age of onset: 40) in his mother; Pancreatic cancer in his paternal grandfather. Allergies  Allergen Reactions   Olive Tree Other (See Comments)   Pollen Extract     Other reaction(s): Other (See Comments)   Current Outpatient Medications on File Prior to Visit  Medication Sig Dispense Refill   aspirin 81 MG tablet Take 81 mg by mouth daily.     atorvastatin (LIPITOR) 40 MG tablet TAKE 1 TABLET EVERY DAY 90 tablet 3   fluticasone (FLONASE) 50 MCG/ACT nasal spray USE 2 SPRAYS IN EACH NOSTRIL EVERY DAY (SUBSTITUTED FOR  FLONASE) 48 g 3   ibuprofen (ADVIL,MOTRIN) 200 MG tablet Take 200 mg by mouth every 6 (six) hours as needed for headache or moderate pain.     metoprolol  succinate (TOPROL-XL) 50 MG 24 hr tablet TAKE 1 TABLET EVERY DAY 90 tablet 3   montelukast (SINGULAIR) 10 MG tablet TAKE 1 TABLET EVERY DAY 90 tablet 3   tamsulosin (FLOMAX) 0.4 MG CAPS capsule Take 1 capsule (0.4 mg total) by mouth daily. 90 capsule 3   Current Facility-Administered Medications on File Prior to Visit  Medication Dose Route Frequency Provider Last Rate Last Admin   gentamicin (GARAMYCIN) 500 mg in dextrose 5 % 100 mL IVPB  500 mg Intravenous Once Alexis Frock, MD            ROS:  All others reviewed and negative.  Objective        PE:  BP 122/62 (BP Location: Right Arm, Patient Position: Sitting, Cuff Size: Large)   Pulse 63   Temp 98.7 F (37.1 C) (Oral)   Ht '6\' 4"'$  (1.93 m)   Wt 296 lb (134.3 kg)   SpO2 93%   BMI 36.03 kg/m                 Constitutional: Pt appears in NAD               HENT: Head: NCAT.                Right Ear: External ear normal.                 Left Ear: External ear normal.                Eyes: . Pupils are equal, round, and reactive to light. Conjunctivae and EOM are normal               Nose: without d/c or deformity               Neck: Neck supple. Gross normal ROM               Cardiovascular: Normal rate and regular rhythm.                 Pulmonary/Chest: Effort normal and breath sounds without rales or wheezing.                Abd:  Soft, NT, ND, + BS, no organomegaly               Neurological: Pt is alert. At baseline orientation, motor grossly intact               Skin: Skin is warm. No rashes, no other new lesions, LE edema - none  Psychiatric: Pt behavior is normal without agitation   Micro: none  Cardiac tracings I have personally interpreted today:  none  Pertinent Radiological findings (summarize): none   Lab Results  Component Value Date   WBC 8.0 05/05/2020   HGB 17.1 (H) 05/05/2020   HCT 50.6 05/05/2020   PLT 225.0 05/05/2020   GLUCOSE 107 (H) 11/12/2020   CHOL 122 11/12/2020   TRIG 101.0  11/12/2020   HDL 40.10 11/12/2020   LDLDIRECT 148.7 04/12/2008   LDLCALC 62 11/12/2020   ALT 36 11/12/2020   AST 27 11/12/2020   NA 138 11/12/2020   K 4.3 11/12/2020   CL 104 11/12/2020   CREATININE 1.27 11/12/2020   BUN 20 11/12/2020   CO2 23 11/12/2020   TSH 3.26 05/05/2020   PSA 1.09 05/05/2020   HGBA1C 6.0 11/12/2020   Assessment/Plan:  Antonio Reyes is a 75 y.o. White or Caucasian [1] male with  has a past medical history of Abdominal hernia, Allergic rhinitis, Anxiety, Aortic atherosclerosis ([place]) (04/25/2018), Benign neoplasm of stomach, BPH (benign prostatic hypertrophy), C6 cervical fracture ([place]), Cancer ([place]) (2000), Chronic sinusitis, CKD (chronic kidney disease), stage III ([place]), Diverticulitis, Diverticulosis (03/27/2015), Dizziness, ED (erectile dysfunction), GERD (gastroesophageal reflux disease), History of herpes genitalis, History of kidney stones (04/25/2018), HLD (hyperlipidemia), HTN (hypertension), Hydronephrosis, Inguinal hernia (04/25/2018), Low back pain, Migraine, Neck pain, bilateral, Obesity, Polycythemia (2000), Rotator cuff tear, Sleep apnea, and Tubular adenoma of colon (05/2008).  Lower abdominal pain Has hx of renal stones somewhat similar pains, will check renal stone CT, UA and pain control, .folllowj   Hyperglycemia Lab Results  Component Value Date   HGBA1C 6.0 11/12/2020   Stable, pt to continue current medical treatment - diet   Essential hypertension BP Readings from Last 3 Encounters:  12/01/20 122/62  11/12/20 118/82  05/09/20 130/74   Stable, pt to continue medical treatment toprol   Allergic rhinitis Mild uncontrolled, to add allegra and nasacort,  to f/u any worsening symptoms or concerns  CKD (chronic kidney disease) stage 3, GFR 30-59 ml/min (HCC) ddclines further lab today ow  Lab Results  Component Value Date   CREATININE 1.27 11/12/2020   Stable overall, cont to avoid nephrotoxins  Followup: Return if symptoms  worsen or fail to improve.  Cathlean Cower, MD 12/06/2020 9:24 PM [facility]

## 2020-12-02 ENCOUNTER — Other Ambulatory Visit: Payer: Self-pay | Admitting: Internal Medicine

## 2020-12-02 ENCOUNTER — Encounter: Payer: Self-pay | Admitting: Internal Medicine

## 2020-12-02 ENCOUNTER — Ambulatory Visit (INDEPENDENT_AMBULATORY_CARE_PROVIDER_SITE_OTHER)
Admission: RE | Admit: 2020-12-02 | Discharge: 2020-12-02 | Disposition: A | Discharge status: Home / Self Care | Payer: Self-pay | Source: Ambulatory Visit | Attending: Internal Medicine | Admitting: Internal Medicine

## 2020-12-02 DIAGNOSIS — I10 Essential (primary) hypertension: Secondary | ICD-10-CM

## 2020-12-02 DIAGNOSIS — R931 Abnormal findings on diagnostic imaging of heart and coronary circulation: Secondary | ICD-10-CM | POA: Insufficient documentation

## 2020-12-06 ENCOUNTER — Encounter: Payer: Self-pay | Admitting: Internal Medicine

## 2020-12-06 DIAGNOSIS — R1030 Lower abdominal pain, unspecified: Secondary | ICD-10-CM | POA: Insufficient documentation

## 2020-12-06 NOTE — Assessment & Plan Note (Addendum)
ddclines further lab today ow  Lab Results  Component Value Date   CREATININE 1.27 11/12/2020   Stable overall, cont to avoid nephrotoxins

## 2020-12-06 NOTE — Assessment & Plan Note (Signed)
BP Readings from Last 3 Encounters:  12/01/20 122/62  11/12/20 118/82  05/09/20 130/74   Stable, pt to continue medical treatment toprol

## 2020-12-06 NOTE — Assessment & Plan Note (Signed)
Lab Results  Component Value Date   HGBA1C 6.0 11/12/2020   Stable, pt to continue current medical treatment - diet

## 2020-12-06 NOTE — Assessment & Plan Note (Signed)
Has hx of renal stones somewhat similar pains, will check renal stone CT, UA and pain control, .folllowj

## 2020-12-06 NOTE — Patient Instructions (Signed)
You will be contacted regarding the referral for: CT scan  Ok to try the allegra and nasaocrt  Please continue all other medications as before, and refills have been done if requested.  Please have the pharmacy call with any other refills you may need.  Please keep your appointments with your specialists as you may have planned

## 2020-12-06 NOTE — Assessment & Plan Note (Signed)
Mild uncontrolled, to add allegra and nasacort,  to f/u any worsening symptoms or concerns

## 2020-12-30 ENCOUNTER — Ambulatory Visit
Admission: RE | Admit: 2020-12-30 | Discharge: 2020-12-30 | Disposition: A | Discharge status: Home / Self Care | Payer: Medicare Other | Source: Ambulatory Visit | Attending: Internal Medicine | Admitting: Internal Medicine

## 2020-12-30 DIAGNOSIS — R1030 Lower abdominal pain, unspecified: Secondary | ICD-10-CM

## 2020-12-30 DIAGNOSIS — Z87442 Personal history of urinary calculi: Secondary | ICD-10-CM | POA: Diagnosis not present

## 2020-12-30 DIAGNOSIS — K5730 Diverticulosis of large intestine without perforation or abscess without bleeding: Secondary | ICD-10-CM | POA: Diagnosis not present

## 2020-12-30 DIAGNOSIS — K4020 Bilateral inguinal hernia, without obstruction or gangrene, not specified as recurrent: Secondary | ICD-10-CM | POA: Diagnosis not present

## 2020-12-30 DIAGNOSIS — I700 Atherosclerosis of aorta: Secondary | ICD-10-CM | POA: Diagnosis not present

## 2020-12-31 ENCOUNTER — Encounter: Payer: Self-pay | Admitting: Internal Medicine

## 2021-01-08 ENCOUNTER — Ambulatory Visit (INDEPENDENT_AMBULATORY_CARE_PROVIDER_SITE_OTHER): Payer: Medicare Other

## 2021-01-08 DIAGNOSIS — Z1211 Encounter for screening for malignant neoplasm of colon: Secondary | ICD-10-CM | POA: Diagnosis not present

## 2021-01-08 DIAGNOSIS — Z0000 Encounter for general adult medical examination without abnormal findings: Secondary | ICD-10-CM

## 2021-01-08 NOTE — Progress Notes (Addendum)
I connected with Antonio Reyes today by telephone and verified that I am speaking with the correct person using two identifiers. Location patient: home Location provider: work Persons participating in the virtual visit: patient, provider.   I discussed the limitations, risks, security and privacy concerns of performing an evaluation and management service by telephone and the availability of in person appointments. I also discussed with the patient that there may be a patient responsible charge related to this service. The patient expressed understanding and verbally consented to this telephonic visit.    Interactive audio and video telecommunications were attempted between this provider and patient, however failed, due to patient having technical difficulties OR patient did not have access to video capability.  We continued and completed visit with audio only.  Some vital signs may be absent or patient reported.   Time Spent with patient on telephone encounter: 40 minutes  Subjective:   Antonio Reyes is a 75 y.o. male who presents for an Initial Medicare Annual Wellness Visit.  Review of Systems     Cardiac Risk Factors include: advanced age (>64men, >41 women);dyslipidemia;family history of premature cardiovascular disease;hypertension;male gender     Objective:    There were no vitals filed for this visit. There is no height or weight on file to calculate BMI.  Advanced Directives 01/08/2021 05/31/2018 05/26/2018 05/15/2018 04/30/2018 03/12/2015  Does Patient Have a Medical Advance Directive? Yes Yes Yes Yes No Yes  Type of Advance Directive Living will;Healthcare Power of [place];Living will [place];Living will Living will;Healthcare Power of [place];Living will  Does patient want to make changes to medical advance directive? No - Patient declined - No - Patient declined No - Patient declined - -  Copy  of [place] in Chart? No - copy requested No - copy requested No - copy requested No - copy requested - -  Would patient like information on creating a medical advance directive? - - - No - Patient declined No - Patient declined -    Current Medications (verified) Outpatient Encounter Medications as of 01/08/2021  Medication Sig   atorvastatin (LIPITOR) 40 MG tablet TAKE 1 TABLET EVERY DAY   metoprolol succinate (TOPROL-XL) 50 MG 24 hr tablet TAKE 1 TABLET EVERY DAY   montelukast (SINGULAIR) 10 MG tablet TAKE 1 TABLET EVERY DAY   aspirin 81 MG tablet Take 81 mg by mouth daily.   fluticasone (FLONASE) 50 MCG/ACT nasal spray USE 2 SPRAYS IN EACH NOSTRIL EVERY DAY (SUBSTITUTED FOR  FLONASE)   ibuprofen (ADVIL,MOTRIN) 200 MG tablet Take 200 mg by mouth every 6 (six) hours as needed for headache or moderate pain.   tamsulosin (FLOMAX) 0.4 MG CAPS capsule Take 1 capsule (0.4 mg total) by mouth daily.   Facility-Administered Encounter Medications as of 01/08/2021  Medication   gentamicin (GARAMYCIN) 500 mg in dextrose 5 % 100 mL IVPB    Allergies (verified) Olive tree and Pollen extract   History: Past Medical History:  Diagnosis Date   Abdominal hernia    Allergic rhinitis    Anxiety    pt states no history of anxiety   Aortic atherosclerosis ([place]) 04/25/2018   noted on CT renal   Benign neoplasm of stomach    BPH (benign prostatic hypertrophy)    pt unaware   C6 cervical fracture ([place])    C7,C8   Cancer ([place]) 2000   skin cancer   left ear  Chronic sinusitis    CKD (chronic kidney disease), stage III ([place])    pt unaware   Diverticulitis    Diverticulosis 03/27/2015   Moderate, Noted on colonoscopy   Dizziness    ED (erectile dysfunction)    GERD (gastroesophageal reflux disease)    History of herpes genitalis    History of kidney stones 04/25/2018   4 mm left UVJ stone, 10 mm bladder stone, noted on CT renal   HLD (hyperlipidemia)    HTN  (hypertension)    Hydronephrosis    Left, Moderate, noted on CT renal   Inguinal hernia 04/25/2018   Bilateral, noted on CT renal, pt unaware   Low back pain    Migraine    hx of   Neck pain, bilateral    Obesity    Polycythemia 2000   per Dr. Elsworth Soho   Rotator cuff tear    Sleep apnea    CPAP   Tubular adenoma of colon 05/2008   Past Surgical History:  Procedure Laterality Date   COLONOSCOPY  03/27/2015   CYSTOSCOPY WITH RETROGRADE PYELOGRAM, URETEROSCOPY AND STENT PLACEMENT Left 05/31/2018   Procedure: CYSTOSCOPY WITH RETROGRADE PYELOGRAM, URETEROSCOPY AND STENT PLACEMENT;  Surgeon: Alexis Frock, MD;  [facility];  Service: Urology;  Laterality: Left;  1 HR   HERNIA REPAIR     umbilical with mesh   HOLMIUM LASER APPLICATION Left 2/75/1700   Procedure: HOLMIUM LASER APPLICATION;  Surgeon: Alexis Frock, MD;  [facility];  Service: Urology;  Laterality: Left;   KNEE SURGERY Right    mensicus   2011   POLYPECTOMY     UPPER GI ENDOSCOPY  06/16/2005   Family History  Problem Relation Age of Onset   Colon cancer Mother 58   Pancreatic cancer Paternal Grandfather    Colon polyps Neg Hx    Esophageal cancer Neg Hx    Stomach cancer Neg Hx    Rectal cancer Neg Hx    Social History   Socioeconomic History   Marital status: Married    Spouse name: Not on file   Number of children: 1   Years of education: Not on file   Highest education level: Not on file  Occupational History   Occupation: Press photographer and marketing ( Pharmacist, hospital)  Tobacco Use   Smoking status: Light Smoker    Types: Cigars   Smokeless tobacco: Never   Tobacco comments:    occasionally  every 3 months  Vaping Use   Vaping Use: Never used  Substance and Sexual Activity   Alcohol use: Yes    Alcohol/week: 1.0 standard drink    Types: 1 Standard drinks or equivalent per week    Comment: rare   Drug use: No   Sexual activity: Yes  Other Topics Concern   Not on file  Social History Narrative    Not on file   Social Determinants of Health   Financial Resource Strain: Low Risk    Difficulty of Paying Living Expenses: Not hard at all  Food Insecurity: No Food Insecurity   Worried About Charity fundraiser in the Last Year: Never true   [place] in the Last Year: Never true  Transportation Needs: No Transportation Needs   Lack of Transportation (Medical): No   Lack of Transportation (Non-Medical): No  Physical Activity: Inactive   Days of Exercise per Week: 0 days   Minutes of Exercise per Session: 0 min  Stress: No Stress Concern Present  Feeling of Stress : Not at all  Social Connections: Socially Integrated   Frequency of Communication with Friends and Family: More than three times a week   Frequency of Social Gatherings with Friends and Family: More than three times a week   Attends Religious Services: More than 4 times per year   Active Member of [employer] or Organizations: Yes   Attends Music therapist: More than 4 times per year   Marital Status: Married    Tobacco Counseling Ready to quit: Not Answered Counseling given: Not Answered Tobacco comments: occasionally  every 3 months   Clinical Intake:  Pre-visit preparation completed: Yes  Pain : No/denies pain     Nutritional Risks: None Diabetes: No  How often do you need to have someone help you when you read instructions, pamphlets, or other written materials from your doctor or pharmacy?: 1 - Never What is the last grade level you completed in school?: Graduate Degree  Diabetic? no  Interpreter Needed?: No  Information entered by :: Lisette Abu, LPN   Activities of Daily Living In your present state of health, do you have any difficulty performing the following activities: 01/08/2021 05/09/2020  Hearing? N N  Vision? N N  Difficulty concentrating or making decisions? N N  Walking or climbing stairs? N N  Dressing or bathing? N N  Doing errands, shopping? N N  Preparing  Food and eating ? N -  Using the Toilet? N -  In the past six months, have you accidently leaked urine? N -  Do you have problems with loss of bowel control? N -  Managing your Medications? N -  Managing your Finances? N -  Housekeeping or managing your Housekeeping? N -  Some recent data might be hidden    Patient Care Team: [name], MD as PCP - General  Indicate any recent Medical Services you may have received from other than [facility] providers in the past year (date may be approximate).     Assessment:   This is a routine wellness examination for Jet.  Hearing/Vision screen Hearing Screening - Comments:: Patient denied any hearing difficulty.   No hearing aids.  Vision Screening - Comments:: Patient denied any vision difficulty.  No corrective lenses.  Dietary issues and exercise activities discussed: Current Exercise Habits: The patient does not participate in regular exercise at present, Exercise limited by: orthopedic condition(s)   Goals Addressed   None   Depression Screen PHQ 2/9 Scores 01/08/2021 11/12/2020 05/09/2020 05/09/2020 04/26/2019 04/21/2018 04/22/2017  PHQ - 2 Score 0 0 0 0 0 0 0  PHQ- 9 Score - - - - - - 0    Fall Risk Fall Risk  11/12/2020 05/09/2020 05/09/2020 04/26/2019 04/21/2018  Falls in the past year? 0 0 0 0 0  Number falls in past yr: 0 - 0 - -  Injury with Fall? 0 - 0 - -    FALL RISK PREVENTION PERTAINING TO THE HOME:  Any stairs in or around the home? Yes  If so, are there any without handrails? No  Home free of loose throw rugs in walkways, pet beds, electrical cords, etc? Yes  Adequate lighting in your home to reduce risk of falls? Yes   ASSISTIVE DEVICES UTILIZED TO PREVENT FALLS:  Life alert? No  Use of a cane, walker or w/c? No  Grab bars in the bathroom? Yes  Shower chair or bench in shower? Yes  Elevated toilet seat or a handicapped toilet?  Yes   TIMED UP AND GO:  Was the test performed? No .  Length of time to ambulate 10  feet: n/a sec.   Gait steady and fast without use of assistive device  Cognitive Function: Normal cognitive status assessed by direct observation by this Nurse Health Advisor. No abnormalities found.          Immunizations Immunization History  Administered Date(s) Administered   H1N1 04/12/2008   Influenza Split 01/22/2008, 01/28/2009, 12/23/2012   Influenza Whole 01/22/2008, 01/28/2009   Influenza, High Dose Seasonal PF 02/13/2014, 04/21/2016, 12/21/2016, 01/17/2018, 12/23/2018, 01/07/2020   Influenza, Seasonal, Injecte, Preservative Fre 12/20/2012   Influenza-Unspecified 11/28/2020   PFIZER(Purple Top)SARS-COV-2 Vaccination 04/28/2019, 05/19/2019, 12/14/2019, 10/15/2020   Pneumococcal Conjugate-13 02/09/2013   Pneumococcal Polysaccharide-23 04/17/2014   Td 04/12/2008   Tdap 04/12/2008, 08/23/2014, 04/24/2018   Zoster Recombinat (Shingrix) 11/28/2020   Zoster, Live 04/12/2008    TDAP status: Up to date  Flu Vaccine status: Up to date  Pneumococcal vaccine status: Up to date  Covid-19 vaccine status: Completed vaccines  Qualifies for Shingles Vaccine? Yes   Zostavax completed Yes   Shingrix Completed?: No.    Education has been provided regarding the importance of this vaccine. Patient has been advised to call insurance company to determine out of pocket expense if they have not yet received this vaccine. Advised may also receive vaccine at local pharmacy or Health Dept. Verbalized acceptance and understanding.  Screening Tests Health Maintenance  Topic Date Due   COVID-19 Vaccine (5 - Booster for Pfizer series) 12/10/2020   Zoster Vaccines- Shingrix (2 of 2) 01/23/2021   COLONOSCOPY (Pts 45-83yrs Insurance coverage will need to be confirmed)  08/06/2021   TETANUS/TDAP  04/24/2028   Pneumonia Vaccine 46+ Years old  Completed   INFLUENZA VACCINE  Completed   Hepatitis C Screening  Completed   HPV VACCINES  Aged Out    Health Maintenance  Health Maintenance Due   Topic Date Due   COVID-19 Vaccine (5 - Booster for [place] series) 12/10/2020    Colorectal cancer screening: Referral to GI placed 01/08/2021. Pt aware the office will call re: appt.  Lung Cancer Screening: (Low Dose CT Chest recommended if Age 80-80 years, 30 pack-year currently smoking OR have quit w/in 15years.) does not qualify.   Lung Cancer Screening Referral: no  Additional Screening:  Hepatitis C Screening: does qualify; Completed Yes  Vision Screening: Recommended annual ophthalmology exams for early detection of glaucoma and other disorders of the eye. Is the patient up to date with their annual eye exam?  No  Who is the provider or what is the name of the office in which the patient attends annual eye exams? Declined If pt is not established with a provider, would they like to be referred to a provider to establish care? No .   Dental Screening: Recommended annual dental exams for proper oral hygiene  Community Resource Referral / Chronic Care Management: CRR required this visit?  No   CCM required this visit?  No      Plan:     I have personally reviewed and noted the following in the patient's chart:   Medical and social history Use of alcohol, tobacco or illicit drugs  Current medications and supplements including opioid prescriptions. Patient is not currently taking opioid prescriptions. Functional ability and status Nutritional status Physical activity Advanced directives List of other physicians Hospitalizations, surgeries, and ER visits in previous 12 months Vitals Screenings to include cognitive, depression, and falls  Referrals and appointments  In addition, I have reviewed and discussed with patient certain preventive protocols, quality metrics, and best practice recommendations. A written personalized care plan for preventive services as well as general preventive health recommendations were provided to patient.     Sheral Flow,  LPN   28/31/5176   Nurse Notes:  Patient is cogitatively intact. There were no vitals filed for this visit. There is no height or weight on file to calculate BMI.    Medical screening examination/treatment/procedure(s) were performed by non-physician practitioner and as supervising physician I was immediately available for consultation/collaboration.  I agree with above. Cathlean Cower, MD

## 2021-01-08 NOTE — Patient Instructions (Signed)
[name] , Thank you for taking time to come for your Medicare Wellness Visit. I appreciate your ongoing commitment to your health goals. Please review the following plan we discussed and let me know if I can assist you in the future.   Screening recommendations/referrals: Colonoscopy: 08/07/2018; due every 3 years Recommended yearly ophthalmology/optometry visit for glaucoma screening and checkup Recommended yearly dental visit for hygiene and checkup  Vaccinations: Influenza vaccine: 11/28/2020 Pneumococcal vaccine: 02/09/2013, 04/17/2014 Tdap vaccine: 04/24/2018; due every 10 years Shingles vaccine: 11/28/2020; need second dose   Covid-19:04/28/2019, 05/19/2019, 12/14/2019, 10/15/2020  Advanced directives: Please bring a copy of your health care power of attorney and living will to the office at your convenience.  Conditions/risks identified: Yes; Client understands the importance of follow-up with providers by attending scheduled visits and discussed goals to eat healthier, increase physical activity, exercise the brain, socialize more, get enough sleep and make time for laughter.  Next appointment: Please schedule your next Medicare Wellness Visit with your Nurse Health Advisor in 1 year by calling [phone].  Preventive Care 35 Years and Older, Male Preventive care refers to lifestyle choices and visits with your health care provider that can promote health and wellness. What does preventive care include? A yearly physical exam. This is also called an annual well check. Dental exams once or twice a year. Routine eye exams. Ask your health care provider how often you should have your eyes checked. Personal lifestyle choices, including: Daily care of your teeth and gums. Regular physical activity. Eating a healthy diet. Avoiding tobacco and drug use. Limiting alcohol use. Practicing safe sex. Taking low doses of aspirin every day. Taking vitamin and mineral supplements as recommended  by your health care provider. What happens during an annual well check? The services and screenings done by your health care provider during your annual well check will depend on your age, overall health, lifestyle risk factors, and family history of disease. Counseling  Your health care provider may ask you questions about your: Alcohol use. Tobacco use. Drug use. Emotional well-being. Home and relationship well-being. Sexual activity. Eating habits. History of falls. Memory and ability to understand (cognition). Work and work Statistician. Screening  You may have the following tests or measurements: Height, weight, and BMI. Blood pressure. Lipid and cholesterol levels. These may be checked every 5 years, or more frequently if you are over 39 years old. Skin check. Lung cancer screening. You may have this screening every year starting at age 25 if you have a 30-pack-year history of smoking and currently smoke or have quit within the past 15 years. Fecal occult blood test (FOBT) of the stool. You may have this test every year starting at age 28. Flexible sigmoidoscopy or colonoscopy. You may have a sigmoidoscopy every 5 years or a colonoscopy every 10 years starting at age 13. Prostate cancer screening. Recommendations will vary depending on your family history and other risks. Hepatitis C blood test. Hepatitis B blood test. Sexually transmitted disease (STD) testing. Diabetes screening. This is done by checking your blood sugar (glucose) after you have not eaten for a while (fasting). You may have this done every 1-3 years. Abdominal aortic aneurysm (AAA) screening. You may need this if you are a current or former smoker. Osteoporosis. You may be screened starting at age 38 if you are at high risk. Talk with your health care provider about your test results, treatment options, and if necessary, the need for more tests. Vaccines  Your health care provider  may recommend certain  vaccines, such as: Influenza vaccine. This is recommended every year. Tetanus, diphtheria, and acellular pertussis (Tdap, Td) vaccine. You may need a Td booster every 10 years. Zoster vaccine. You may need this after age 81. Pneumococcal 13-valent conjugate (PCV13) vaccine. One dose is recommended after age 49. Pneumococcal polysaccharide (PPSV23) vaccine. One dose is recommended after age 44. Talk to your health care provider about which screenings and vaccines you need and how often you need them. This information is not intended to replace advice given to you by your health care provider. Make sure you discuss any questions you have with your health care provider. Document Released: 04/04/2015 Document Revised: 11/26/2015 Document Reviewed: 01/07/2015 Elsevier Interactive Patient Education  2017 [place] Prevention in the Home Falls can cause injuries. They can happen to people of all ages. There are many things you can do to make your home safe and to help prevent falls. What can I do on the outside of my home? Regularly fix the edges of walkways and driveways and fix any cracks. Remove anything that might make you trip as you walk through a door, such as a raised step or threshold. Trim any bushes or trees on the path to your home. Use bright outdoor lighting. Clear any walking paths of anything that might make someone trip, such as rocks or tools. Regularly check to see if handrails are loose or broken. Make sure that both sides of any steps have handrails. Any raised decks and porches should have guardrails on the edges. Have any leaves, snow, or ice cleared regularly. Use sand or salt on walking paths during winter. Clean up any spills in your garage right away. This includes oil or grease spills. What can I do in the bathroom? Use night lights. Install grab bars by the toilet and in the tub and shower. Do not use towel bars as grab bars. Use non-skid mats or decals in  the tub or shower. If you need to sit down in the shower, use a plastic, non-slip stool. Keep the floor dry. Clean up any water that spills on the floor as soon as it happens. Remove soap buildup in the tub or shower regularly. Attach bath mats securely with double-sided non-slip rug tape. Do not have throw rugs and other things on the floor that can make you trip. What can I do in the bedroom? Use night lights. Make sure that you have a light by your bed that is easy to reach. Do not use any sheets or blankets that are too big for your bed. They should not hang down onto the floor. Have a firm chair that has side arms. You can use this for support while you get dressed. Do not have throw rugs and other things on the floor that can make you trip. What can I do in the kitchen? Clean up any spills right away. Avoid walking on wet floors. Keep items that you use a lot in easy-to-reach places. If you need to reach something above you, use a strong step stool that has a grab bar. Keep electrical cords out of the way. Do not use floor polish or wax that makes floors slippery. If you must use wax, use non-skid floor wax. Do not have throw rugs and other things on the floor that can make you trip. What can I do with my stairs? Do not leave any items on the stairs. Make sure that there are handrails on both sides  of the stairs and use them. Fix handrails that are broken or loose. Make sure that handrails are as long as the stairways. Check any carpeting to make sure that it is firmly attached to the stairs. Fix any carpet that is loose or worn. Avoid having throw rugs at the top or bottom of the stairs. If you do have throw rugs, attach them to the floor with carpet tape. Make sure that you have a light switch at the top of the stairs and the bottom of the stairs. If you do not have them, ask someone to add them for you. What else can I do to help prevent falls? Wear shoes that: Do not have high  heels. Have rubber bottoms. Are comfortable and fit you well. Are closed at the toe. Do not wear sandals. If you use a stepladder: Make sure that it is fully opened. Do not climb a closed stepladder. Make sure that both sides of the stepladder are locked into place. Ask someone to hold it for you, if possible. Clearly mark and make sure that you can see: Any grab bars or handrails. First and last steps. Where the edge of each step is. Use tools that help you move around (mobility aids) if they are needed. These include: Canes. Walkers. Scooters. Crutches. Turn on the lights when you go into a dark area. Replace any light bulbs as soon as they burn out. Set up your furniture so you have a clear path. Avoid moving your furniture around. If any of your floors are uneven, fix them. If there are any pets around you, be aware of where they are. Review your medicines with your doctor. Some medicines can make you feel dizzy. This can increase your chance of falling. Ask your doctor what other things that you can do to help prevent falls. This information is not intended to replace advice given to you by your health care provider. Make sure you discuss any questions you have with your health care provider. Document Released: 01/02/2009 Document Revised: 08/14/2015 Document Reviewed: 04/12/2014 Elsevier Interactive Patient Education  2017 [employer].

## 2021-01-08 NOTE — Progress Notes (Signed)
Cardiology Office Note:    Date:  01/08/2021   ID:  Antonio Reyes, DOB [birth year], MRN [id number]  PCP:  [name], MD   [facility] Providers Cardiologist:  None     Referring MD: [name], MD   No chief complaint on file. High CAC  History of Present Illness:    Antonio Reyes is a 75 y.o. male with a hx of HTN, OSA, referred for abnormal CAC score 368  Today he is doing well. He notes a pain in the center of his chest  that is exacerbated by pushing it. I is sharp. He notes that it happens every so often.  It happens with migraines.  This is the same pain he described in 2012. He feels like a bone break contributed. He denies angina, dyspnea on exertion, lower extremity edema, PND or orthopnea. 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 He has trip planned to the [place] soon with his wife. He has no limiting chest pain. His blood pressures are well controlled.  Cardiac Hx: He was planned for a lexiscan in 2012 but did not feel he needed it. He saw Dr. Percival Spanish and he reported chest discomfort for one month. He states this sharp/reproducible discomfort is similar  Family History: No family hx of CAD. Mother colon cancer  Social: Smoked in the service 50-60 years ago. Cigar three times a year. Etoh- social. No drug use. Married , one adult child. Two grandchildren, one on the way  CVD Risk/Equivalent: HLD- LDL at goal 62 on atorvastatin HTN- yes well controlled PAD- no DMII - no Smoker- Former   Past Medical History:  Diagnosis Date   Abdominal hernia    Allergic rhinitis    Anxiety    pt states no history of anxiety   Aortic atherosclerosis ([place]) 04/25/2018   noted on CT renal   Benign neoplasm of stomach    BPH (benign prostatic hypertrophy)    pt unaware   C6 cervical fracture (HCC)    C7,C8   Cancer ([place]) 2000   skin cancer   left ear   Chronic sinusitis    CKD (chronic kidney disease), stage III ([place])    pt unaware    Diverticulitis    Diverticulosis 03/27/2015   Moderate, Noted on colonoscopy   Dizziness    ED (erectile dysfunction)    GERD (gastroesophageal reflux disease)    History of herpes genitalis    History of kidney stones 04/25/2018   4 mm left UVJ stone, 10 mm bladder stone, noted on CT renal   HLD (hyperlipidemia)    HTN (hypertension)    Hydronephrosis    Left, Moderate, noted on CT renal   Inguinal hernia 04/25/2018   Bilateral, noted on CT renal, pt unaware   Low back pain    Migraine    hx of   Neck pain, bilateral    Obesity    Polycythemia 2000   per Dr. Elsworth Soho   Rotator cuff tear    Sleep apnea    CPAP   Tubular adenoma of colon 05/2008    Past Surgical History:  Procedure Laterality Date   COLONOSCOPY  03/27/2015   CYSTOSCOPY WITH RETROGRADE PYELOGRAM, URETEROSCOPY AND STENT PLACEMENT Left 05/31/2018   Procedure: CYSTOSCOPY WITH RETROGRADE PYELOGRAM, URETEROSCOPY AND STENT PLACEMENT;  Surgeon: Alexis Frock, MD;  Location: [facility];  Service: Urology;  Laterality: Left;  1 HR   HERNIA REPAIR  umbilical with mesh   HOLMIUM LASER APPLICATION Left 8/78/6767   Procedure: HOLMIUM LASER APPLICATION;  Surgeon: Alexis Frock, MD;  Location: [facility];  Service: Urology;  Laterality: Left;   KNEE SURGERY Right    mensicus   2011   POLYPECTOMY     UPPER GI ENDOSCOPY  06/16/2005    Current Medications: No outpatient medications have been marked as taking for the 01/09/21 encounter (Appointment) with Janina Mayo, MD.     Allergies:   Olive tree and Pollen extract   Social History   Socioeconomic History   Marital status: Married    Spouse name: Not on file   Number of children: 1   Years of education: Not on file   Highest education level: Not on file  Occupational History   Occupation: Press photographer and marketing ( Pharmacist, hospital)  Tobacco Use   Smoking status: Light Smoker    Types: Cigars   Smokeless tobacco: Never   Tobacco comments:    occasionally  every 3 months   Vaping Use   Vaping Use: Never used  Substance and Sexual Activity   Alcohol use: Yes    Alcohol/week: 1.0 standard drink    Types: 1 Standard drinks or equivalent per week    Comment: rare   Drug use: No   Sexual activity: Yes  Other Topics Concern   Not on file  Social History Narrative   Not on file   Social Determinants of Health   Financial Resource Strain: Low Risk    Difficulty of Paying Living Expenses: Not hard at all  Food Insecurity: No Food Insecurity   Worried About Charity fundraiser in the Last Year: Never true   [place] in the Last Year: Never true  Transportation Needs: No Transportation Needs   Lack of Transportation (Medical): No   Lack of Transportation (Non-Medical): No  Physical Activity: Inactive   Days of Exercise per Week: 0 days   Minutes of Exercise per Session: 0 min  Stress: No Stress Concern Present   Feeling of Stress : Not at all  Social Connections: Socially Integrated   Frequency of Communication with Friends and Family: More than three times a week   Frequency of Social Gatherings with Friends and Family: More than three times a week   Attends Religious Services: More than 4 times per year   Active Member of [employer] or Organizations: Yes   Attends Music therapist: More than 4 times per year   Marital Status: Married     Family History: The patient's family history includes Colon cancer (age of onset: 36) in his mother; Pancreatic cancer in his paternal grandfather. There is no history of Colon polyps, Esophageal cancer, Stomach cancer, or Rectal cancer.  ROS:   Please see the history of present illness.     All other systems reviewed and are negative.  EKGs/Labs/Other Studies Reviewed:    The following studies were reviewed today:   EKG:  EKG is  ordered today.  The ekg ordered today demonstrates   NSR, inferior q waves (not new). No acute ST-T changes   Recent Labs: 05/05/2020: Hemoglobin 17.1; Platelets  225.0; TSH 3.26 11/12/2020: ALT 36; BUN 20; Creatinine, Ser 1.27; Potassium 4.3; Sodium 138   Recent Lipid Panel    Component Value Date/Time   CHOL 122 11/12/2020 0931   TRIG 101.0 11/12/2020 0931   HDL 40.10 11/12/2020 0931   CHOLHDL 3 11/12/2020 0931   VLDL 20.2  11/12/2020 0931   LDLCALC 62 11/12/2020 0931   LDLDIRECT 148.7 04/12/2008 0728     Risk Assessment/Calculations:           Physical Exam:    VS:  There were no vitals taken for this visit.    Wt Readings from Last 3 Encounters:  12/01/20 296 lb (134.3 kg)  11/12/20 296 lb (134.3 kg)  05/09/20 294 lb (133.4 kg)     GEN:  Well nourished, well developed in no acute distress HEENT: Normal NECK: No JVD; No carotid bruits CARDIAC: RRR, no murmurs, rubs, gallops RESPIRATORY:  Clear to auscultation without rales, wheezing or rhonchi  ABDOMEN: Soft, non-tender, non-distended MUSCULOSKELETAL:  No edema; No deformity  SKIN: Warm and dry NEUROLOGIC:  Alert and oriented x 3 PSYCHIATRIC:  Normal affect   ASSESSMENT:    #CAC: with score >300 10 year event rate is 13-25%.Predominant disease in the LAD with 193. Followed by RCA 111. Left main was 0. He is on atorvastatin 40 mg and LDL is at goal <70 for someone with higher risk. Recommend continuing atorvastatin at current dose. No T2DM. Blood pressure is well controlled. Continue to encourage improved diet/weightloss. Provided materials for the Mediterranean diet. I counseled him about signs of angina and if his symptoms were to progress we would opt for LHC. He was in agreement.   PLAN:    In order of problems listed above:  Follow up 3 months    Medication Adjustments/Labs and Tests Ordered: Current medicines are reviewed at length with the patient today.  Concerns regarding medicines are outlined above.    Signed, Janina Mayo, MD  01/08/2021 2:58 PM    [place]

## 2021-01-09 ENCOUNTER — Encounter: Payer: Self-pay | Admitting: Internal Medicine

## 2021-01-09 ENCOUNTER — Ambulatory Visit (INDEPENDENT_AMBULATORY_CARE_PROVIDER_SITE_OTHER): Payer: Medicare Other | Admitting: Internal Medicine

## 2021-01-09 ENCOUNTER — Other Ambulatory Visit: Payer: Self-pay

## 2021-01-09 VITALS — BP 130/80 | HR 61 | Ht 76.0 in | Wt 298.8 lb

## 2021-01-09 DIAGNOSIS — R931 Abnormal findings on diagnostic imaging of heart and coronary circulation: Secondary | ICD-10-CM | POA: Diagnosis not present

## 2021-01-09 NOTE — Patient Instructions (Signed)
Medication Instructions:  The current medical regimen is effective;  continue present plan and medications.  *If you need a refill on your cardiac medications before your next appointment, please call your pharmacy*   Follow-Up: At [facility], you and your health needs are our priority.  As part of our continuing mission to provide you with exceptional heart care, we have created designated Provider Care Teams.  These Care Teams include your primary Cardiologist (physician) and Advanced Practice Providers (APPs -  Physician Assistants and Nurse Practitioners) who all work together to provide you with the care you need, when you need it.  We recommend signing up for the patient portal called "MyChart".  Sign up information is provided on this After Visit Summary.  MyChart is used to connect with patients for Virtual Visits (Telemedicine).  Patients are able to view lab/test results, encounter notes, upcoming appointments, etc.  Non-urgent messages can be sent to your provider as well.   To learn more about what you can do with MyChart, go to [email or web address].    Your next appointment:   3 month(s)  The format for your next appointment:   In Person  Provider:   Phineas Inches, MD

## 2021-01-16 ENCOUNTER — Ambulatory Visit: Payer: Medicare Other | Admitting: Internal Medicine

## 2021-02-03 DIAGNOSIS — L820 Inflamed seborrheic keratosis: Secondary | ICD-10-CM | POA: Diagnosis not present

## 2021-02-03 DIAGNOSIS — D492 Neoplasm of unspecified behavior of bone, soft tissue, and skin: Secondary | ICD-10-CM | POA: Diagnosis not present

## 2021-02-04 ENCOUNTER — Ambulatory Visit (INDEPENDENT_AMBULATORY_CARE_PROVIDER_SITE_OTHER): Payer: Medicare Other

## 2021-02-04 ENCOUNTER — Ambulatory Visit: Payer: Self-pay

## 2021-02-04 ENCOUNTER — Other Ambulatory Visit: Payer: Self-pay

## 2021-02-04 ENCOUNTER — Ambulatory Visit (INDEPENDENT_AMBULATORY_CARE_PROVIDER_SITE_OTHER): Payer: Medicare Other | Admitting: Orthopaedic Surgery

## 2021-02-04 ENCOUNTER — Encounter: Payer: Self-pay | Admitting: Orthopaedic Surgery

## 2021-02-04 DIAGNOSIS — G8929 Other chronic pain: Secondary | ICD-10-CM

## 2021-02-04 DIAGNOSIS — M25562 Pain in left knee: Secondary | ICD-10-CM | POA: Diagnosis not present

## 2021-02-04 DIAGNOSIS — M25561 Pain in right knee: Secondary | ICD-10-CM

## 2021-02-04 DIAGNOSIS — R931 Abnormal findings on diagnostic imaging of heart and coronary circulation: Secondary | ICD-10-CM

## 2021-02-04 NOTE — Progress Notes (Signed)
Office Visit Note   Patient: Antonio Reyes           Date of Birth: 05/25/1945           MRN: [id number] Visit Date: 02/04/2021              Requested by: [name], MD [address] [place],  [place] [address] PCP: [name], MD   Assessment & Plan: Visit Diagnoses:  1. Chronic pain of both knees     Plan: Patient with a history of bilateral right greater than left knee pain.  Status post right knee arthroscopy with meniscectomy approximately 11 years ago.  He has had worsening of pain especially on the medial side of the right knee.  She has had good success with relief of his symptoms with using Voltaren gel once daily.  We discussed with him that he could go forward and use this up to 3 times daily.  If needed he could go forward with a steroid injection.  He will defer this for now.  Follow-Up Instructions: No follow-ups on file.   Orders:  Orders Placed This Encounter  Procedures   XR KNEE 3 VIEW LEFT   XR KNEE 3 VIEW RIGHT   No orders of the defined types were placed in this encounter.     Procedures: No procedures performed   Clinical Data: No additional findings.   Subjective: Chief Complaint  Patient presents with   Right Knee - Pain   Left Knee - Pain  Patient presents today for bilateral knee pain. He said that they have hurt for quite some time, but worsening. Both hurt equally the same. The pain is mostly medially on each knee. They both swell and will give way at times. He has been using Voltaren gel. He has a history of a right knee arthroscopy in the past.    Review of Systems  All other systems reviewed and are negative.   Objective: Vital Signs: There were no vitals taken for this visit.  Physical Exam Constitutional:      Appearance: Normal appearance.  HENT:     Head: Atraumatic.  Eyes:     Extraocular Movements: Extraocular movements intact.  Pulmonary:     Effort: Pulmonary effort is normal.  Skin:    General: Skin  is warm and dry.  Neurological:     General: No focal deficit present.     Mental Status: He is alert.  Psychiatric:        Mood and Affect: Mood normal.        Behavior: Behavior normal.    Ortho Exam Examination he is clinically varus malalignment.  Right knee he has no swelling he does have a small effusion but no cellulitis.  No tenderness over the lateral compartment and he is tender medially over the medial joint line.  Some crepitus with range of motion.  Good varus valgus stability.  Right knee varus alignment he does have some tenderness to palpation over the medial joint line no swelling minimal effusion Specialty Comments:  No specialty comments available.  Imaging: No results found.   PMFS History: Patient Active Problem List   Diagnosis Date Noted   Lower abdominal pain 12/06/2020   Elevated coronary artery calcium score 12/02/2020   Smoker 11/16/2020   Vitamin D deficiency 05/09/2020   Laceration of digital nerve of left thumb 01/31/2020   Bilateral posterior neck pain 05/09/2018   Microhematuria 04/24/2018   CKD (chronic kidney  disease) stage 3, GFR 30-59 ml/min (HCC) 04/24/2018   Dysuria 04/21/2018   Allergic rhinitis 09/20/2017   Nonallopathic lesion of sacral region 08/04/2017   Nonallopathic lesion of lumbosacral region 08/04/2017   Nonallopathic lesion of thoracic region 08/04/2017   Bilateral foot pain 04/22/2017   Left leg pain 04/22/2017   Bilateral groin pain 04/22/2017   Metatarsalgia 11/11/2016   Hallux rigidus 06/11/2016   Contusion of left knee 06/11/2016   Hyperglycemia 04/21/2016   Chest pain 06/05/2015   Polycythemia, secondary 02/09/2013   Dizziness 06/08/2011   Chronic sinusitis 06/08/2011   Ankle pain 08/18/2010   Morbid obesity ([place]) 04/20/2010   ABRASION, ARM 04/20/2010   CHEST PAIN 07/22/2009   GENITAL HERPES 04/12/2008   ANXIETY 04/12/2008   MIGRAINE, COMMON 04/12/2008   BPH associated with nocturia 04/12/2008   COLONIC  POLYPS, HX OF 04/12/2008   LOW BACK PAIN 11/26/2006   HYPERCHOLESTEROLEMIA 11/17/2006   ERECTILE DYSFUNCTION 11/17/2006   Essential hypertension 11/17/2006   GERD 11/17/2006   OSA (obstructive sleep apnea) 11/17/2006   DIVERTICULITIS, HX OF 11/17/2006   Past Medical History:  Diagnosis Date   Abdominal hernia    Allergic rhinitis    Anxiety    pt states no history of anxiety   Aortic atherosclerosis ([place]) 04/25/2018   noted on CT renal   Benign neoplasm of stomach    BPH (benign prostatic hypertrophy)    pt unaware   C6 cervical fracture ([place])    C7,C8   Cancer ([place]) 2000   skin cancer   left ear   Chronic sinusitis    CKD (chronic kidney disease), stage III ([place])    pt unaware   Diverticulitis    Diverticulosis 03/27/2015   Moderate, Noted on colonoscopy   Dizziness    ED (erectile dysfunction)    GERD (gastroesophageal reflux disease)    History of herpes genitalis    History of kidney stones 04/25/2018   4 mm left UVJ stone, 10 mm bladder stone, noted on CT renal   HLD (hyperlipidemia)    HTN (hypertension)    Hydronephrosis    Left, Moderate, noted on CT renal   Inguinal hernia 04/25/2018   Bilateral, noted on CT renal, pt unaware   Low back pain    Migraine    hx of   Neck pain, bilateral    Obesity    Polycythemia 2000   per Dr. Elsworth Soho   Rotator cuff tear    Sleep apnea    CPAP   Tubular adenoma of colon 05/2008    Family History  Problem Relation Age of Onset   Colon cancer Mother 62   Pancreatic cancer Paternal Grandfather    Colon polyps Neg Hx    Esophageal cancer Neg Hx    Stomach cancer Neg Hx    Rectal cancer Neg Hx     Past Surgical History:  Procedure Laterality Date   COLONOSCOPY  03/27/2015   CYSTOSCOPY WITH RETROGRADE PYELOGRAM, URETEROSCOPY AND STENT PLACEMENT Left 05/31/2018   Procedure: CYSTOSCOPY WITH RETROGRADE PYELOGRAM, URETEROSCOPY AND STENT PLACEMENT;  Surgeon: Alexis Frock, MD;  Location: [facility];  Service: Urology;   Laterality: Left;  1 HR   HERNIA REPAIR     umbilical with mesh   HOLMIUM LASER APPLICATION Left 9/32/6712   Procedure: HOLMIUM LASER APPLICATION;  Surgeon: Alexis Frock, MD;  Location: [facility];  Service: Urology;  Laterality: Left;   KNEE SURGERY Right    mensicus   2011   POLYPECTOMY  UPPER GI ENDOSCOPY  06/16/2005   Social History   Occupational History   Occupation: Magazine features editor)  Tobacco Use   Smoking status: Light Smoker    Types: Cigars   Smokeless tobacco: Never   Tobacco comments:    occasionally  every 3 months  Vaping Use   Vaping Use: Never used  Substance and Sexual Activity   Alcohol use: Yes    Alcohol/week: 1.0 standard drink    Types: 1 Standard drinks or equivalent per week    Comment: rare   Drug use: No   Sexual activity: Yes

## 2021-02-18 ENCOUNTER — Encounter: Payer: Self-pay | Admitting: Internal Medicine

## 2021-02-18 ENCOUNTER — Other Ambulatory Visit: Payer: Self-pay

## 2021-02-18 ENCOUNTER — Telehealth: Payer: Self-pay | Admitting: Internal Medicine

## 2021-02-18 ENCOUNTER — Ambulatory Visit (INDEPENDENT_AMBULATORY_CARE_PROVIDER_SITE_OTHER): Payer: Medicare Other | Admitting: Internal Medicine

## 2021-02-18 VITALS — BP 124/62 | HR 74 | Resp 18 | Ht 76.0 in | Wt 299.8 lb

## 2021-02-18 DIAGNOSIS — F17200 Nicotine dependence, unspecified, uncomplicated: Secondary | ICD-10-CM

## 2021-02-18 DIAGNOSIS — R931 Abnormal findings on diagnostic imaging of heart and coronary circulation: Secondary | ICD-10-CM

## 2021-02-18 DIAGNOSIS — J0191 Acute recurrent sinusitis, unspecified: Secondary | ICD-10-CM

## 2021-02-18 DIAGNOSIS — E559 Vitamin D deficiency, unspecified: Secondary | ICD-10-CM

## 2021-02-18 DIAGNOSIS — R739 Hyperglycemia, unspecified: Secondary | ICD-10-CM

## 2021-02-18 MED ORDER — AZITHROMYCIN 250 MG PO TABS: ORAL_TABLET | ORAL | 1 refills | Status: AC

## 2021-02-18 NOTE — Telephone Encounter (Signed)
Patient calling in  Says he was around his family for thanksgiving & some of them were sick w/ a possible sinus infection.. patient says he has been trying OTC meds & nothing has been working  Patient wants to know if provider is willing to send z-pack to pharmacy  [id number] - [place], [address]  Phone:  [phone] Fax:  [phone]

## 2021-02-18 NOTE — Patient Instructions (Signed)
Please take all new medication as prescribed - [facility]  Please continue all other medications as before, and refills have been done if requested.  Please have the pharmacy call with any other refills you may need.  Please continue your efforts at being more active, low cholesterol diet, and weight control.  Please keep your appointments with your specialists as you may have planned

## 2021-02-18 NOTE — Telephone Encounter (Signed)
Patient was seen in office today.  

## 2021-02-18 NOTE — Progress Notes (Signed)
Patient ID: Antonio Reyes, male   DOB: May 24, 1945, 75 y.o.   MRN: [id number]        Chief Complaint: follow up sinus symptoms, smoking, vit D, hyperglycemia       HPI:  Antonio Reyes is a 75 y.o. male  Here with 2-3 days acute onset fever, facial pain, pressure, headache, general weakness and malaise, and greenish d/c, with mild ST and cough, but pt denies chest pain, wheezing, increased sob or doe, orthopnea, PND, increased LE swelling, palpitations, dizziness or syncope.   Pt denies polydipsia, polyuria, or new focal neuro s/s.  Takes Vit d off and on.  Pt smoking, not ready to quit       Wt Readings from Last 3 Encounters:  02/18/21 299 lb 12.8 oz (136 kg)  01/09/21 298 lb 12.8 oz (135.5 kg)  12/01/20 296 lb (134.3 kg)   BP Readings from Last 3 Encounters:  02/18/21 124/62  01/09/21 130/80  12/01/20 122/62         Past Medical History:  Diagnosis Date   Abdominal hernia    Allergic rhinitis    Anxiety    pt states no history of anxiety   Aortic atherosclerosis ([place]) 04/25/2018   noted on CT renal   Benign neoplasm of stomach    BPH (benign prostatic hypertrophy)    pt unaware   C6 cervical fracture (HCC)    C7,C8   Cancer ([place]) 2000   skin cancer   left ear   Chronic sinusitis    CKD (chronic kidney disease), stage III ([place])    pt unaware   Diverticulitis    Diverticulosis 03/27/2015   Moderate, Noted on colonoscopy   Dizziness    ED (erectile dysfunction)    GERD (gastroesophageal reflux disease)    History of herpes genitalis    History of kidney stones 04/25/2018   4 mm left UVJ stone, 10 mm bladder stone, noted on CT renal   HLD (hyperlipidemia)    HTN (hypertension)    Hydronephrosis    Left, Moderate, noted on CT renal   Inguinal hernia 04/25/2018   Bilateral, noted on CT renal, pt unaware   Low back pain    Migraine    hx of   Neck pain, bilateral    Obesity    Polycythemia 2000   per Dr. Elsworth Soho   Rotator cuff tear    Sleep apnea    CPAP    Tubular adenoma of colon 05/2008   Past Surgical History:  Procedure Laterality Date   COLONOSCOPY  03/27/2015   CYSTOSCOPY WITH RETROGRADE PYELOGRAM, URETEROSCOPY AND STENT PLACEMENT Left 05/31/2018   Procedure: CYSTOSCOPY WITH RETROGRADE PYELOGRAM, URETEROSCOPY AND STENT PLACEMENT;  Surgeon: Alexis Frock, MD;  [facility];  Service: Urology;  Laterality: Left;  1 HR   HERNIA REPAIR     umbilical with mesh   HOLMIUM LASER APPLICATION Left 4/68/0321   Procedure: HOLMIUM LASER APPLICATION;  Surgeon: Alexis Frock, MD;  [facility];  Service: Urology;  Laterality: Left;   KNEE SURGERY Right    mensicus   2011   POLYPECTOMY     UPPER GI ENDOSCOPY  06/16/2005    reports that he has been smoking cigars. He has never used smokeless tobacco. He reports current alcohol use of about 1.0 standard drink per week. He reports that he does not use drugs. family history includes Colon cancer (age of onset: 49) in his mother; Pancreatic cancer in his paternal grandfather. Allergies  Allergen Reactions   Olive Tree Other (See Comments)   Pollen Extract     Other reaction(s): Other (See Comments)   Current Outpatient Medications on File Prior to Visit  Medication Sig Dispense Refill   aspirin 81 MG tablet Take 81 mg by mouth daily.     atorvastatin (LIPITOR) 40 MG tablet TAKE 1 TABLET EVERY DAY 90 tablet 3   fluticasone (FLONASE) 50 MCG/ACT nasal spray USE 2 SPRAYS IN EACH NOSTRIL EVERY DAY (SUBSTITUTED FOR  FLONASE) 48 g 3   ibuprofen (ADVIL,MOTRIN) 200 MG tablet Take 200 mg by mouth every 6 (six) hours as needed for headache or moderate pain.     metoprolol succinate (TOPROL-XL) 50 MG 24 hr tablet TAKE 1 TABLET EVERY DAY 90 tablet 3   montelukast (SINGULAIR) 10 MG tablet TAKE 1 TABLET EVERY DAY 90 tablet 3   tamsulosin (FLOMAX) 0.4 MG CAPS capsule Take 1 capsule (0.4 mg total) by mouth daily. 90 capsule 3   Current Facility-Administered Medications on File Prior to Visit  Medication  Dose Route Frequency Provider Last Rate Last Admin   gentamicin (GARAMYCIN) 500 mg in dextrose 5 % 100 mL IVPB  500 mg Intravenous Once Alexis Frock, MD            ROS:  All others reviewed and negative.  Objective        PE:  BP 124/62   Pulse 74   Resp 18   Ht 6\' 4"  (1.93 m)   Wt 299 lb 12.8 oz (136 kg)   SpO2 95%   BMI 36.49 kg/m                 Constitutional: Pt appears in NAD               HENT: Head: NCAT.                Right Ear: External ear normal.                 Left Ear: External ear normal. Bilat tm's with mild erythema.  Max sinus areas mild tender.  Pharynx with mild erythema, no exudate               Eyes: . Pupils are equal, round, and reactive to light. Conjunctivae and EOM are normal               Nose: without d/c or deformity               Neck: Neck supple. Gross normal ROM               Cardiovascular: Normal rate and regular rhythm.                 Pulmonary/Chest: Effort normal and breath sounds without rales or wheezing.                Abd:  Soft, NT, ND, + BS, no organomegaly               Neurological: Pt is alert. At baseline orientation, motor grossly intact               Skin: Skin is warm. No rashes, no other new lesions, LE edema - none               Psychiatric: Pt behavior is normal without agitation   Micro: none  Cardiac tracings I have personally interpreted today:  none  Pertinent Radiological findings (summarize):  none   Lab Results  Component Value Date   WBC 8.0 05/05/2020   HGB 17.1 (H) 05/05/2020   HCT 50.6 05/05/2020   PLT 225.0 05/05/2020   GLUCOSE 107 (H) 11/12/2020   CHOL 122 11/12/2020   TRIG 101.0 11/12/2020   HDL 40.10 11/12/2020   LDLDIRECT 148.7 04/12/2008   LDLCALC 62 11/12/2020   ALT 36 11/12/2020   AST 27 11/12/2020   NA 138 11/12/2020   K 4.3 11/12/2020   CL 104 11/12/2020   CREATININE 1.27 11/12/2020   BUN 20 11/12/2020   CO2 23 11/12/2020   TSH 3.26 05/05/2020   PSA 1.09 05/05/2020   HGBA1C 6.0  11/12/2020   Assessment/Plan:  Antonio Reyes is a 75 y.o. White or Caucasian [1] male with  has a past medical history of Abdominal hernia, Allergic rhinitis, Anxiety, Aortic atherosclerosis ([place]) (04/25/2018), Benign neoplasm of stomach, BPH (benign prostatic hypertrophy), C6 cervical fracture ([place]), Cancer ([place]) (2000), Chronic sinusitis, CKD (chronic kidney disease), stage III ([place]), Diverticulitis, Diverticulosis (03/27/2015), Dizziness, ED (erectile dysfunction), GERD (gastroesophageal reflux disease), History of herpes genitalis, History of kidney stones (04/25/2018), HLD (hyperlipidemia), HTN (hypertension), Hydronephrosis, Inguinal hernia (04/25/2018), Low back pain, Migraine, Neck pain, bilateral, Obesity, Polycythemia (2000), Rotator cuff tear, Sleep apnea, and Tubular adenoma of colon (05/2008).  Vitamin D deficiency . Last vitamin D Lab Results  Component Value Date   VD25OH 26.75 (L) 11/12/2020   Low, reminded to start oral replacement   Hyperglycemia Lab Results  Component Value Date   HGBA1C 6.0 11/12/2020   Stable, pt to continue current medical treatment  - diet   Acute sinusitis Mild to mod, for antibx course,  to f/u any worsening symptoms or concerns  Smoker Counseled to quit, pt not ready  Followup: No follow-ups on file.  Cathlean Cower, MD 02/19/2021 4:17 AM [facility]

## 2021-02-19 ENCOUNTER — Encounter: Payer: Self-pay | Admitting: Internal Medicine

## 2021-02-19 DIAGNOSIS — J019 Acute sinusitis, unspecified: Secondary | ICD-10-CM | POA: Insufficient documentation

## 2021-02-19 NOTE — Assessment & Plan Note (Signed)
Lab Results  Component Value Date   HGBA1C 6.0 11/12/2020   Stable, pt to continue current medical treatment  - diet

## 2021-02-19 NOTE — Assessment & Plan Note (Signed)
Counseled to quit, pt not ready 

## 2021-02-19 NOTE — Assessment & Plan Note (Signed)
Mild to mod, for antibx course,  to f/u any worsening symptoms or concerns 

## 2021-02-19 NOTE — Assessment & Plan Note (Signed)
.   Last vitamin D Lab Results  Component Value Date   VD25OH 26.75 (L) 11/12/2020   Low, reminded to start oral replacement

## 2021-03-30 ENCOUNTER — Encounter: Payer: Self-pay | Admitting: Internal Medicine

## 2021-03-30 ENCOUNTER — Ambulatory Visit (INDEPENDENT_AMBULATORY_CARE_PROVIDER_SITE_OTHER): Payer: Medicare Other | Admitting: Internal Medicine

## 2021-03-30 ENCOUNTER — Other Ambulatory Visit: Payer: Self-pay

## 2021-03-30 VITALS — BP 116/78 | HR 58 | Ht 76.0 in | Wt 296.8 lb

## 2021-03-30 DIAGNOSIS — R931 Abnormal findings on diagnostic imaging of heart and coronary circulation: Secondary | ICD-10-CM

## 2021-03-30 NOTE — Progress Notes (Signed)
Cardiology Office Note:    Date:  03/30/2021   ID:  ROPER TOLSON, DOB 03-Mar-1946, MRN [id number]  PCP:  [name], MD   [facility] Providers Cardiologist:  None     Referring MD: [name], MD   No chief complaint on file. High CAC  History of Present Illness:    Antonio Reyes is a 76 y.o. male with a hx of HTN, OSA, referred for abnormal CAC score 368  Today he is doing well. He notes a pain in the center of his chest  that is exacerbated by pushing it. I is sharp. He notes that it happens every so often.  It happens with migraines.  This is the same pain he described in 2012. He feels like a bone break contributed. He denies angina, dyspnea on exertion, lower extremity edema, PND or orthopnea. 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 He has trip planned to the [place] soon with his wife. He has no limiting chest pain. His blood pressures are well controlled.  Cardiac Hx: He was planned for a lexiscan in 2012 but did not feel he needed it. He saw Dr. Percival Spanish and he reported chest discomfort for one month. He states this sharp/reproducible discomfort is similar  Family History: No family hx of CAD. Mother colon cancer  Social: Smoked in the service 50-60 years ago. Cigar three times a year. Etoh- social. No drug use. Married , one adult child. Two grandchildren, one on the way  CVD Risk/Equivalent: HLD- LDL at goal 62 on atorvastatin HTN- yes well controlled PAD- no DMII - no Smoker- Former  Interim Hx: He denies any chest pain and no shortness of breath with activity. He's had some URIs.  He's had some knee pains. He has a CPAP machine, has trouble with keeping it on all night. He's taking the atorvastatin 40 mg. He notes that he is sedentary. He would like to retire. He wants to go back to swim. His blood pressures is well controlled. He continues on metop 50 XL.   Past Medical History:  Diagnosis Date   Abdominal hernia    Allergic  rhinitis    Anxiety    pt states no history of anxiety   Aortic atherosclerosis ([place]) 04/25/2018   noted on CT renal   Benign neoplasm of stomach    BPH (benign prostatic hypertrophy)    pt unaware   C6 cervical fracture (HCC)    C7,C8   Cancer ([place]) 2000   skin cancer   left ear   Chronic sinusitis    CKD (chronic kidney disease), stage III ([place])    pt unaware   Diverticulitis    Diverticulosis 03/27/2015   Moderate, Noted on colonoscopy   Dizziness    ED (erectile dysfunction)    GERD (gastroesophageal reflux disease)    History of herpes genitalis    History of kidney stones 04/25/2018   4 mm left UVJ stone, 10 mm bladder stone, noted on CT renal   HLD (hyperlipidemia)    HTN (hypertension)    Hydronephrosis    Left, Moderate, noted on CT renal   Inguinal hernia 04/25/2018   Bilateral, noted on CT renal, pt unaware   Low back pain    Migraine    hx of   Neck pain, bilateral    Obesity    Polycythemia 2000   per Dr. Elsworth Soho   Rotator cuff tear    Sleep  apnea    CPAP   Tubular adenoma of colon 05/2008    Past Surgical History:  Procedure Laterality Date   COLONOSCOPY  03/27/2015   CYSTOSCOPY WITH RETROGRADE PYELOGRAM, URETEROSCOPY AND STENT PLACEMENT Left 05/31/2018   Procedure: CYSTOSCOPY WITH RETROGRADE PYELOGRAM, URETEROSCOPY AND STENT PLACEMENT;  Surgeon: Alexis Frock, MD;  [facility];  Service: Urology;  Laterality: Left;  1 HR   HERNIA REPAIR     umbilical with mesh   HOLMIUM LASER APPLICATION Left 5/88/3254   Procedure: HOLMIUM LASER APPLICATION;  Surgeon: Alexis Frock, MD;  [facility];  Service: Urology;  Laterality: Left;   KNEE SURGERY Right    mensicus   2011   POLYPECTOMY     UPPER GI ENDOSCOPY  06/16/2005    Current Medications: No outpatient medications have been marked as taking for the 03/30/21 encounter (Appointment) with Antonio Mayo, MD.     Allergies:   Olive tree and Pollen extract   Social History   Socioeconomic  History   Marital status: Married    Spouse name: Not on file   Number of children: 1   Years of education: Not on file   Highest education level: Not on file  Occupational History   Occupation: Press photographer and marketing ( Pharmacist, hospital)  Tobacco Use   Smoking status: Light Smoker    Types: Cigars   Smokeless tobacco: Never   Tobacco comments:    occasionally  every 3 months  Vaping Use   Vaping Use: Never used  Substance and Sexual Activity   Alcohol use: Yes    Alcohol/week: 1.0 standard drink    Types: 1 Standard drinks or equivalent per week    Comment: rare   Drug use: No   Sexual activity: Yes  Other Topics Concern   Not on file  Social History Narrative   Not on file   Social Determinants of Health   Financial Resource Strain: Low Risk    Difficulty of Paying Living Expenses: Not hard at all  Food Insecurity: No Food Insecurity   Worried About Charity fundraiser in the Last Year: Never true   [place] in the Last Year: Never true  Transportation Needs: No Transportation Needs   Lack of Transportation (Medical): No   Lack of Transportation (Non-Medical): No  Physical Activity: Inactive   Days of Exercise per Week: 0 days   Minutes of Exercise per Session: 0 min  Stress: No Stress Concern Present   Feeling of Stress : Not at all  Social Connections: Socially Integrated   Frequency of Communication with Friends and Family: More than three times a week   Frequency of Social Gatherings with Friends and Family: More than three times a week   Attends Religious Services: More than 4 times per year   Active Member of [employer] or Organizations: Yes   Attends Music therapist: More than 4 times per year   Marital Status: Married     Family History: The patient's family history includes Colon cancer (age of onset: 70) in his mother; Pancreatic cancer in his paternal grandfather. There is no history of Colon polyps, Esophageal cancer, Stomach cancer, or Rectal  cancer.  ROS:   Please see the history of present illness.     All other systems reviewed and are negative.  EKGs/Labs/Other Studies Reviewed:    The following studies were reviewed today:   EKG:  EKG is  ordered today.  The ekg ordered  today demonstrates   NSR, inferior q waves (not new). ? Septal Q vs. Lead placement. No acute ST-T changes  Sinus bradycardia HR 58 bpm, septal q wave? Vs. lead placement   Recent Labs: 05/05/2020: Hemoglobin 17.1; Platelets 225.0; TSH 3.26 11/12/2020: ALT 36; BUN 20; Creatinine, Ser 1.27; Potassium 4.3; Sodium 138   Recent Lipid Panel    Component Value Date/Time   CHOL 122 11/12/2020 0931   TRIG 101.0 11/12/2020 0931   HDL 40.10 11/12/2020 0931   CHOLHDL 3 11/12/2020 0931   VLDL 20.2 11/12/2020 0931   LDLCALC 62 11/12/2020 0931   LDLDIRECT 148.7 04/12/2008 0728     Risk Assessment/Calculations:           Physical Exam:    VS:    Vitals:   03/30/21 0910  BP: 116/78  Pulse: (!) 58  SpO2: 96%     Wt Readings from Last 3 Encounters:  02/18/21 299 lb 12.8 oz (136 kg)  01/09/21 298 lb 12.8 oz (135.5 kg)  12/01/20 296 lb (134.3 kg)     GEN:  Well nourished, well developed in no acute distress HEENT: moist MMM CARDIAC: RRR, no murmurs, rubs, gallops RESPIRATORY:  Clear to auscultation without rales, wheezing or rhonchi  ABDOMEN: non-distended MUSCULOSKELETAL:  No edema; No deformity  SKIN: Warm and dry NEUROLOGIC:  Alert and oriented x 3 PSYCHIATRIC:  Normal affect   ASSESSMENT:    #CAC: with score >300 10 year event rate is 13-25%.Predominant disease in the LAD with 193. Followed by RCA 111. Left main was 0. He is on atorvastatin 40 mg and LDL is at goal <70 for someone with higher risk. Recommend continuing atorvastatin at current dose. No T2DM. Blood pressure is well controlled. Continue to encourage improved diet/weightloss. Provided materials for the Mediterranean diet. I counseled him on his initial visits, if he has  signs of angina and if his symptoms were to progress we would opt for LHC. He was in agreement.  I reiterated this today. He's had no symptom changes. Will plan to follow up PRN. - continue atorvastatin 40 mg daily -LDL at goal   #HTN: well controlled. Can continue metop XL 50 mg daily. Goal <130/80 mmHg   PLAN:    In order of problems listed above:  Follow up as needed    Medication Adjustments/Labs and Tests Ordered: Current medicines are reviewed at length with the patient today.  Concerns regarding medicines are outlined above.    Signed, Antonio Mayo, MD  03/30/2021 9:03 AM    [facility]

## 2021-03-30 NOTE — Patient Instructions (Signed)

## 2021-04-24 ENCOUNTER — Other Ambulatory Visit: Payer: Self-pay

## 2021-04-24 ENCOUNTER — Encounter: Payer: Self-pay | Admitting: Podiatry

## 2021-04-24 ENCOUNTER — Ambulatory Visit (INDEPENDENT_AMBULATORY_CARE_PROVIDER_SITE_OTHER): Payer: Medicare Other | Admitting: Podiatry

## 2021-04-24 ENCOUNTER — Ambulatory Visit (INDEPENDENT_AMBULATORY_CARE_PROVIDER_SITE_OTHER): Payer: Medicare Other

## 2021-04-24 DIAGNOSIS — M7661 Achilles tendinitis, right leg: Secondary | ICD-10-CM | POA: Diagnosis not present

## 2021-04-24 DIAGNOSIS — M722 Plantar fascial fibromatosis: Secondary | ICD-10-CM

## 2021-04-24 MED ORDER — TRIAMCINOLONE ACETONIDE 10 MG/ML IJ SUSP
10.0000 mg | Freq: Once | INTRAMUSCULAR | Status: AC
  Administered 2021-04-24: 10 mg

## 2021-04-24 NOTE — Progress Notes (Signed)
Subjective:   Patient ID: Antonio Reyes, male   DOB: 76 y.o.   MRN: [id number]   HPI Patient states he is done great with his heel but now he seems to have pain which is more in the backside that is bothering him and he does not remember injury and has been present recently   ROS      Objective:  Physical Exam  Neurovascular status intact with discomfort more on the posterior lateral aspect of the right heel around the Achilles insertion and also a little bit more distal from this area     Assessment:  Probability for low-grade inflammatory condition of the Achilles lateral side and possibly involving peroneal     Plan:  H&P x-ray reviewed discussed treatment options and he is opted for injection I did explain the chances for rupture associated with any injection and around the Achilles.  Patient had sterile prep done I carefully injected 3 mg dexamethasone Kenalog keeping it away from the tendon itself and I advised on reduced activity and reviewed x-ray  X-rays indicate there is a large spur formation moderate detachment but is most likely old and not a new condition

## 2021-05-20 ENCOUNTER — Encounter: Payer: Self-pay | Admitting: Internal Medicine

## 2021-05-20 ENCOUNTER — Ambulatory Visit (INDEPENDENT_AMBULATORY_CARE_PROVIDER_SITE_OTHER): Payer: Medicare Other | Admitting: Internal Medicine

## 2021-05-20 ENCOUNTER — Other Ambulatory Visit: Payer: Self-pay

## 2021-05-20 VITALS — BP 112/68 | HR 53 | Temp 98.6°F | Ht 76.0 in | Wt 296.0 lb

## 2021-05-20 DIAGNOSIS — R739 Hyperglycemia, unspecified: Secondary | ICD-10-CM

## 2021-05-20 DIAGNOSIS — M79604 Pain in right leg: Secondary | ICD-10-CM

## 2021-05-20 DIAGNOSIS — M79605 Pain in left leg: Secondary | ICD-10-CM | POA: Diagnosis not present

## 2021-05-20 DIAGNOSIS — E559 Vitamin D deficiency, unspecified: Secondary | ICD-10-CM | POA: Diagnosis not present

## 2021-05-20 DIAGNOSIS — N320 Bladder-neck obstruction: Secondary | ICD-10-CM

## 2021-05-20 DIAGNOSIS — Z860100 Personal history of colon polyps, unspecified: Secondary | ICD-10-CM | POA: Insufficient documentation

## 2021-05-20 DIAGNOSIS — N1831 Chronic kidney disease, stage 3a: Secondary | ICD-10-CM

## 2021-05-20 DIAGNOSIS — N401 Enlarged prostate with lower urinary tract symptoms: Secondary | ICD-10-CM | POA: Diagnosis not present

## 2021-05-20 DIAGNOSIS — I10 Essential (primary) hypertension: Secondary | ICD-10-CM | POA: Diagnosis not present

## 2021-05-20 DIAGNOSIS — E538 Deficiency of other specified B group vitamins: Secondary | ICD-10-CM | POA: Diagnosis not present

## 2021-05-20 DIAGNOSIS — F17200 Nicotine dependence, unspecified, uncomplicated: Secondary | ICD-10-CM | POA: Diagnosis not present

## 2021-05-20 DIAGNOSIS — R931 Abnormal findings on diagnostic imaging of heart and coronary circulation: Secondary | ICD-10-CM

## 2021-05-20 DIAGNOSIS — R351 Nocturia: Secondary | ICD-10-CM | POA: Diagnosis not present

## 2021-05-20 LAB — HEPATIC FUNCTION PANEL
ALT: 32 U/L (ref 0–53)
AST: 22 U/L (ref 0–37)
Albumin: 4.4 g/dL (ref 3.5–5.2)
Alkaline Phosphatase: 80 U/L (ref 39–117)
Bilirubin, Direct: 0.2 mg/dL (ref 0.0–0.3)
Total Bilirubin: 0.9 mg/dL (ref 0.2–1.2)
Total Protein: 6.6 g/dL (ref 6.0–8.3)

## 2021-05-20 LAB — CBC WITH DIFFERENTIAL/PLATELET
Basophils Absolute: 0.1 10*3/uL (ref 0.0–0.1)
Basophils Relative: 0.9 % (ref 0.0–3.0)
Eosinophils Absolute: 0.1 10*3/uL (ref 0.0–0.7)
Eosinophils Relative: 2.1 % (ref 0.0–5.0)
HCT: 49.9 % (ref 39.0–52.0)
Hemoglobin: 16.5 g/dL (ref 13.0–17.0)
Lymphocytes Relative: 29.3 % (ref 12.0–46.0)
Lymphs Abs: 1.9 10*3/uL (ref 0.7–4.0)
MCHC: 33.1 g/dL (ref 30.0–36.0)
MCV: 91.9 fl (ref 78.0–100.0)
Monocytes Absolute: 0.5 10*3/uL (ref 0.1–1.0)
Monocytes Relative: 7.1 % (ref 3.0–12.0)
Neutro Abs: 3.9 10*3/uL (ref 1.4–7.7)
Neutrophils Relative %: 60.6 % (ref 43.0–77.0)
Platelets: 194 10*3/uL (ref 150.0–400.0)
RBC: 5.42 Mil/uL (ref 4.22–5.81)
RDW: 13.2 % (ref 11.5–15.5)
WBC: 6.4 10*3/uL (ref 4.0–10.5)

## 2021-05-20 LAB — URINALYSIS, ROUTINE W REFLEX MICROSCOPIC
Bilirubin Urine: NEGATIVE
Hgb urine dipstick: NEGATIVE
Ketones, ur: NEGATIVE
Leukocytes,Ua: NEGATIVE
Nitrite: NEGATIVE
RBC / HPF: NONE SEEN (ref 0–?)
Specific Gravity, Urine: 1.025 (ref 1.000–1.030)
Total Protein, Urine: NEGATIVE
Urine Glucose: NEGATIVE
Urobilinogen, UA: 0.2 (ref 0.0–1.0)
pH: 6 (ref 5.0–8.0)

## 2021-05-20 LAB — LIPID PANEL
Cholesterol: 114 mg/dL (ref 0–200)
HDL: 40.2 mg/dL (ref >39.00)
LDL Cholesterol: 56 mg/dL (ref 0–99)
NonHDL: 74.16
Total CHOL/HDL Ratio: 3
Triglycerides: 92 mg/dL (ref 0.0–149.0)
VLDL: 18.4 mg/dL (ref 0.0–40.0)

## 2021-05-20 LAB — VITAMIN B12: Vitamin B-12: 253 pg/mL (ref 211–911)

## 2021-05-20 LAB — BASIC METABOLIC PANEL
BUN: 19 mg/dL (ref 6–23)
CO2: 25 mEq/L (ref 19–32)
Calcium: 9.2 mg/dL (ref 8.4–10.5)
Chloride: 105 mEq/L (ref 96–112)
Creatinine, Ser: 1.3 mg/dL (ref 0.40–1.50)
GFR: 54.42 mL/min — ABNORMAL LOW (ref >60.00)
Glucose, Bld: 112 mg/dL — ABNORMAL HIGH (ref 70–99)
Potassium: 4.2 mEq/L (ref 3.5–5.1)
Sodium: 137 mEq/L (ref 135–145)

## 2021-05-20 LAB — PSA: PSA: 0.74 ng/mL (ref 0.10–4.00)

## 2021-05-20 LAB — HEMOGLOBIN A1C: Hgb A1c MFr Bld: 6 % (ref 4.6–6.5)

## 2021-05-20 LAB — TSH: TSH: 1.51 u[IU]/mL (ref 0.35–5.50)

## 2021-05-20 LAB — VITAMIN D 25 HYDROXY (VIT D DEFICIENCY, FRACTURES): VITD: 19.53 ng/mL — ABNORMAL LOW (ref 30.00–100.00)

## 2021-05-20 MED ORDER — METOPROLOL SUCCINATE ER 50 MG PO TB24: ORAL_TABLET | ORAL | 3 refills | Status: DC

## 2021-05-20 MED ORDER — MONTELUKAST SODIUM 10 MG PO TABS: ORAL_TABLET | ORAL | 3 refills | Status: DC

## 2021-05-20 MED ORDER — ATORVASTATIN CALCIUM 40 MG PO TABS: ORAL_TABLET | ORAL | 3 refills | Status: DC

## 2021-05-20 NOTE — Assessment & Plan Note (Signed)
Last vitamin D ?Lab Results  ?Component Value Date  ? VD25OH 26.75 (L) 11/12/2020  ? ?Low, to start oral replacement ? ?

## 2021-05-20 NOTE — Assessment & Plan Note (Signed)
Lab Results  ?Component Value Date  ? CREATININE 1.27 11/12/2020  ? ?Stable overall, cont to avoid nephrotoxins ? ?

## 2021-05-20 NOTE — Patient Instructions (Signed)
Please continue all other medications as before, and refills have been done if requested. ? ?Please have the pharmacy call with any other refills you may need. ? ?Please continue your efforts at being more active, low cholesterol diet, and weight control. ? ?You are otherwise up to date with prevention measures today. ? ?Please keep your appointments with your specialists as you may have planned ? ?You will be contacted regarding the referral for: leg circulation testing, and colonoscopy ? ?Please go to the LAB at the blood drawing area for the tests to be done ? ?You will be contacted by phone if any changes need to be made immediately.  Otherwise, you will receive a letter about your results with an explanation, but please check with MyChart first. ? ?Please remember to sign up for MyChart if you have not done so, as this will be important to you in the future with finding out test results, communicating by private email, and scheduling acute appointments online when needed. ? ?Please make an Appointment to return in 6 months, or sooner if needed ?

## 2021-05-20 NOTE — Progress Notes (Signed)
Patient ID: Antonio Reyes, male   DOB: [birth year], 76 y.o.   MRN: [id number]         Chief Complaint:: yearly exam       HPI:  Antonio Reyes is a 76 y.o. male here to f/u; has bilat knee djd, has seen ortho  - no need for TKR or cotisone for now.  Also had cortisone to right foot now improved per podiatry.  Pt denies chest pain, increased sob or doe, wheezing, orthopnea, PND, increased LE swelling, palpitations, dizziness or syncope.   Pt denies polydipsia, polyuria, or new focal neuro s/s.    Pt denies fever, wt loss, night sweats, loss of appetite, or other constitutional symptoms  Denies urinary symptoms such as dysuria, frequency, urgency, flank pain, hematuria or n/v, fever, chills.  Not taking flomax recently.  Did have covid infection dec 2021.  Going to [place] 2023, plans to f/u with needed travel medicine needs at [place]  Does also have bilat leg crampy reproducible pain mild intermittent with ambulation, better to rest for over 6 mo now. Still smoking cigar every 3-4 mo, not willing to quit, has done this for over 30 yrs.   Now concerned however with wife retiring next mo, she is 40 yrs younger.    Wt Readings from Last 3 Encounters:  05/20/21 296 lb (134.3 kg)  03/30/21 296 lb 12.8 oz (134.6 kg)  02/18/21 299 lb 12.8 oz (136 kg)   BP Readings from Last 3 Encounters:  05/20/21 112/68  03/30/21 116/78  02/18/21 124/62   Immunization History  Administered Date(s) Administered   H1N1 04/12/2008   Influenza Split 01/22/2008, 01/28/2009, 12/23/2012   Influenza Whole 01/22/2008, 01/28/2009   Influenza, High Dose Seasonal PF 02/13/2014, 04/21/2016, 12/21/2016, 01/17/2018, 12/23/2018, 01/07/2020   Influenza, Seasonal, Injecte, Preservative Fre 12/20/2012   Influenza-Unspecified 11/28/2020   PFIZER(Purple Top)SARS-COV-2 Vaccination 04/28/2019, 05/19/2019, 12/14/2019, 10/15/2020   Pneumococcal Conjugate-13 02/09/2013   Pneumococcal Polysaccharide-23 04/17/2014   Td  04/12/2008   Tdap 04/12/2008, 08/23/2014, 04/24/2018   Zoster Recombinat (Shingrix) 11/28/2020, 02/16/2021   Zoster, Live 04/12/2008   There are no preventive care reminders to display for this patient.     Past Medical History:  Diagnosis Date   Abdominal hernia    Allergic rhinitis    Anxiety    pt states no history of anxiety   Aortic atherosclerosis ([place]) 04/25/2018   noted on CT renal   Benign neoplasm of stomach    BPH (benign prostatic hypertrophy)    pt unaware   C6 cervical fracture (HCC)    C7,C8   Cancer ([place]) 2000   skin cancer   left ear   Chronic sinusitis    CKD (chronic kidney disease), stage III ([place])    pt unaware   Diverticulitis    Diverticulosis 03/27/2015   Moderate, Noted on colonoscopy   Dizziness    ED (erectile dysfunction)    GERD (gastroesophageal reflux disease)    History of herpes genitalis    History of kidney stones 04/25/2018   4 mm left UVJ stone, 10 mm bladder stone, noted on CT renal   HLD (hyperlipidemia)    HTN (hypertension)    Hydronephrosis    Left, Moderate, noted on CT renal   Inguinal hernia 04/25/2018   Bilateral, noted on CT renal, pt unaware   Low back pain    Migraine    hx of   Neck pain, bilateral    Obesity  Polycythemia 2000   per Dr. Elsworth Soho   Rotator cuff tear    Sleep apnea    CPAP   Tubular adenoma of colon 05/2008   Past Surgical History:  Procedure Laterality Date   COLONOSCOPY  03/27/2015   CYSTOSCOPY WITH RETROGRADE PYELOGRAM, URETEROSCOPY AND STENT PLACEMENT Left 05/31/2018   Procedure: CYSTOSCOPY WITH RETROGRADE PYELOGRAM, URETEROSCOPY AND STENT PLACEMENT;  Surgeon: Alexis Frock, MD;  Location: [facility];  Service: Urology;  Laterality: Left;  1 HR   HERNIA REPAIR     umbilical with mesh   HOLMIUM LASER APPLICATION Left 3/73/4287   Procedure: HOLMIUM LASER APPLICATION;  Surgeon: Alexis Frock, MD;  Location: [facility];  Service: Urology;  Laterality: Left;   KNEE SURGERY Right    mensicus   2011    POLYPECTOMY     UPPER GI ENDOSCOPY  06/16/2005    reports that he has been smoking cigars. He has never used smokeless tobacco. He reports current alcohol use of about 1.0 standard drink per week. He reports that he does not use drugs. family history includes Colon cancer (age of onset: 37) in his mother; Pancreatic cancer in his paternal grandfather. Allergies  Allergen Reactions   Olive Tree Other (See Comments)   Pollen Extract     Other reaction(s): Other (See Comments)   Current Outpatient Medications on File Prior to Visit  Medication Sig Dispense Refill   aspirin 81 MG tablet Take 81 mg by mouth daily.     fluticasone (FLONASE) 50 MCG/ACT nasal spray USE 2 SPRAYS IN EACH NOSTRIL EVERY DAY (SUBSTITUTED FOR  FLONASE) 48 g 3   ibuprofen (ADVIL,MOTRIN) 200 MG tablet Take 200 mg by mouth every 6 (six) hours as needed for headache or moderate pain.     Current Facility-Administered Medications on File Prior to Visit  Medication Dose Route Frequency Provider Last Rate Last Admin   gentamicin (GARAMYCIN) 500 mg in dextrose 5 % 100 mL IVPB  500 mg Intravenous Once Alexis Frock, MD            ROS:  All others reviewed and negative.  Objective        PE:  BP 112/68 (BP Location: Right Arm, Patient Position: Sitting, Cuff Size: Large)    Pulse (!) 53    Temp 98.6 F (37 C) (Oral)    Ht 6\' 4"  (1.93 m)    Wt 296 lb (134.3 kg)    SpO2 95%    BMI 36.03 kg/m                 Constitutional: Pt appears in NAD               HENT: Head: NCAT.                Right Ear: External ear normal.                 Left Ear: External ear normal.                Eyes: . Pupils are equal, round, and reactive to light. Conjunctivae and EOM are normal               Nose: without d/c or deformity               Neck: Neck supple. Gross normal ROM               Cardiovascular: Normal rate and regular rhythm.  Pulmonary/Chest: Effort normal and breath sounds without rales or wheezing.                 Abd:  Soft, NT, ND, + BS, no organomegaly               Neurological: Pt is alert. At baseline orientation, motor grossly intact               Skin: Skin is warm. No rashes, no other new lesions, LE edema - none               Psychiatric: Pt behavior is normal without agitation   Micro: none  Cardiac tracings I have personally interpreted today:  none  Pertinent Radiological findings (summarize): none   Lab Results  Component Value Date   WBC 8.0 05/05/2020   HGB 17.1 (H) 05/05/2020   HCT 50.6 05/05/2020   PLT 225.0 05/05/2020   GLUCOSE 107 (H) 11/12/2020   CHOL 122 11/12/2020   TRIG 101.0 11/12/2020   HDL 40.10 11/12/2020   LDLDIRECT 148.7 04/12/2008   LDLCALC 62 11/12/2020   ALT 36 11/12/2020   AST 27 11/12/2020   NA 138 11/12/2020   K 4.3 11/12/2020   CL 104 11/12/2020   CREATININE 1.27 11/12/2020   BUN 20 11/12/2020   CO2 23 11/12/2020   TSH 3.26 05/05/2020   PSA 1.09 05/05/2020   HGBA1C 6.0 11/12/2020   Assessment/Plan:  Antonio Reyes is a 76 y.o. White or Caucasian [1] male with  has a past medical history of Abdominal hernia, Allergic rhinitis, Anxiety, Aortic atherosclerosis ([place]) (04/25/2018), Benign neoplasm of stomach, BPH (benign prostatic hypertrophy), C6 cervical fracture ([place]), Cancer ([place]) (2000), Chronic sinusitis, CKD (chronic kidney disease), stage III ([place]), Diverticulitis, Diverticulosis (03/27/2015), Dizziness, ED (erectile dysfunction), GERD (gastroesophageal reflux disease), History of herpes genitalis, History of kidney stones (04/25/2018), HLD (hyperlipidemia), HTN (hypertension), Hydronephrosis, Inguinal hernia (04/25/2018), Low back pain, Migraine, Neck pain, bilateral, Obesity, Polycythemia (2000), Rotator cuff tear, Sleep apnea, and Tubular adenoma of colon (05/2008).  Vitamin D deficiency Last vitamin D Lab Results  Component Value Date   VD25OH 26.75 (L) 11/12/2020   Low, to start oral replacement   Smoker Has cigar every 3-4 mo  - advised to quit  BPH associated with nocturia Also for psa with labs  CKD (chronic kidney disease) stage 3, GFR 30-59 ml/min (HCC) Lab Results  Component Value Date   CREATININE 1.27 11/12/2020   Stable overall, cont to avoid nephrotoxins   Essential hypertension BP Readings from Last 3 Encounters:  05/20/21 112/68  03/30/21 116/78  02/18/21 124/62   Stable, pt to continue medical treatment toprol   Hyperglycemia Lab Results  Component Value Date   HGBA1C 6.0 11/12/2020   Stable, pt to continue current medical treatment  - diet   Bilateral leg pain Worsening recently, cant r/o PAD - for arterial study  History of colonic polyps Due may 2023 for colonoscopy f/u - will order now  Followup: Return in about 6 months (around 11/20/2021).  Cathlean Cower, MD 05/20/2021 9:09 AM [facility]

## 2021-05-20 NOTE — Assessment & Plan Note (Signed)
BP Readings from Last 3 Encounters:  ?05/20/21 112/68  ?03/30/21 116/78  ?02/18/21 124/62  ? ?Stable, pt to continue medical treatment toprol ? ?

## 2021-05-20 NOTE — Assessment & Plan Note (Signed)
Has cigar every 3-4 mo - advised to quit ?

## 2021-05-20 NOTE — Assessment & Plan Note (Signed)
Lab Results  ?Component Value Date  ? HGBA1C 6.0 11/12/2020  ? ?Stable, pt to continue current medical treatment  - diet ? ?

## 2021-05-20 NOTE — Assessment & Plan Note (Signed)
Also for psa with labs ?

## 2021-05-20 NOTE — Assessment & Plan Note (Signed)
Worsening recently, cant r/o PAD - for arterial study ?

## 2021-05-20 NOTE — Assessment & Plan Note (Signed)
Due may 2023 for colonoscopy f/u - will order now ?

## 2021-05-26 ENCOUNTER — Other Ambulatory Visit: Payer: Self-pay

## 2021-05-26 ENCOUNTER — Ambulatory Visit (HOSPITAL_COMMUNITY)
Admission: RE | Admit: 2021-05-26 | Discharge: 2021-05-26 | Disposition: A | Discharge status: Home / Self Care | Payer: Medicare Other | Source: Ambulatory Visit | Attending: Cardiology | Admitting: Cardiology

## 2021-05-26 DIAGNOSIS — M79604 Pain in right leg: Secondary | ICD-10-CM | POA: Insufficient documentation

## 2021-05-26 DIAGNOSIS — M79605 Pain in left leg: Secondary | ICD-10-CM | POA: Diagnosis not present

## 2021-06-01 ENCOUNTER — Telehealth: Payer: Self-pay | Admitting: Internal Medicine

## 2021-06-01 NOTE — Telephone Encounter (Signed)
Pt called in stating he tried to schedule an appt for a colonoscopy but was told it was too soon to schedule an appt, his next colonoscopy is due in May ? ?Pt states he has an history of polyps and discussed w/ his provider to have an order for the colonoscopy prior to May ? ?Referral was sent to  [facility] on 05-20-2021 ? ?Pt is requesting a c/b regarding sooner appointment  ?

## 2021-06-01 NOTE — Telephone Encounter (Signed)
I called the director of GI to ask about this referral.  She explained he wasn't due until Aug 14, 2021 so the patient just needs to call in April to get scheduled.  When I called the patient back w this info, he gave me many details.  See note below that I sent to [place] in and Higher education careers adviser. ? ?[place], ?This is the patient we discussed. When I called the patient I got more information.  The patient is having some GI issues and feels like he needs a colonoscopy sooner than 08/15/2022.  His mother died with colon cancer and he is very worried.  Can he get scheduled sooner than Aug 15, 2022 for his colonoscopy please? ?Thank you!!! ?

## 2021-06-02 ENCOUNTER — Ambulatory Visit (INDEPENDENT_AMBULATORY_CARE_PROVIDER_SITE_OTHER): Payer: Medicare Other | Admitting: Gastroenterology

## 2021-06-02 ENCOUNTER — Encounter: Payer: Self-pay | Admitting: Gastroenterology

## 2021-06-02 VITALS — BP 110/82 | HR 84 | Ht 76.0 in | Wt 301.0 lb

## 2021-06-02 DIAGNOSIS — Z86010 Personal history of colonic polyps: Secondary | ICD-10-CM

## 2021-06-02 DIAGNOSIS — K219 Gastro-esophageal reflux disease without esophagitis: Secondary | ICD-10-CM

## 2021-06-02 DIAGNOSIS — K5900 Constipation, unspecified: Secondary | ICD-10-CM | POA: Diagnosis not present

## 2021-06-02 DIAGNOSIS — R931 Abnormal findings on diagnostic imaging of heart and coronary circulation: Secondary | ICD-10-CM

## 2021-06-02 DIAGNOSIS — R1032 Left lower quadrant pain: Secondary | ICD-10-CM | POA: Diagnosis not present

## 2021-06-02 MED ORDER — NA SULFATE-K SULFATE-MG SULF 17.5-3.13-1.6 GM/177ML PO SOLN: 1.0000 | Freq: Once | ORAL | 0 refills | Status: AC

## 2021-06-02 MED ORDER — OMEPRAZOLE 20 MG PO CPDR: 20.0000 mg | DELAYED_RELEASE_CAPSULE | Freq: Every day | ORAL | 11 refills | Status: AC

## 2021-06-02 NOTE — Progress Notes (Signed)
? ? ?  History of Present Illness: This is a 76 year old male with a personal history of colon polyps, mild LLQ abdominal pain and GERD.  He relates intermittent mild left lower quadrant abdominal pain for 6 months.  In addition he has noticed harder stools and more frequent straining with bowel movements over the past 6 months.  He has a bowel movement daily or every other day.  Over the past few months he has noticed worsening reflux symptoms and takes Tums almost on a daily basis.  He notes tomato based products are particularly bothersome.  No other gastrointestinal complaints. CBC, LFTs from 05/20/2021 normal. Denies weight loss, diarrhea, change in stool caliber, melena, hematochezia, nausea, vomiting, dysphagia, chest pain. ? ? ?CT AP 12/31/2020 without contrast: ?No evidence of urolithiasis, hydronephrosis, or other acute findings. ?Stable chronic left renal parenchymal scarring.  ?Colonic diverticulosis, without radiographic evidence of diverticulitis. ?Stable small bilateral inguinal hernias, which contain only fat. ? ? ?Colonoscopy 07/2018 ?- One 4 mm polyp in the cecum, removed with a cold biopsy forceps. Resected and retrieved. ?- Five 5 to 8 mm polyps in the descending colon, in the transverse colon and in the ascending colon, removed with a cold snare. Resected and retrieved. ?- Moderate diverticulosis in the sigmoid colon, in the descending colon and in the transverse colon. ?- Internal hemorrhoids. ?- The examination was otherwise normal on direct and retroflexion views. ?Path: TAs and SSP ? ?Current Medications, Allergies, Past Medical History, Past Surgical History, Family History and Social History were reviewed in [employer] record. ? ? ?Physical Exam: ?General: Well developed, well nourished, no acute distress ?Head: Normocephalic and atraumatic ?Eyes: Sclerae anicteric, EOMI ?Ears: Normal auditory acuity ?Mouth: Not examined, mask on during Covid-19 pandemic ?Lungs: Clear  throughout to auscultation ?Heart: Regular rate and rhythm; no murmurs, rubs or bruits ?Abdomen: Soft, mild LLQ tenderness and non distended. No masses, hepatosplenomegaly or hernias noted. Normal Bowel sounds ?Rectal: Deferred to colonoscopy  ?Musculoskeletal: Symmetrical with no gross deformities  ?Pulses:  Normal pulses noted ?Extremities: No clubbing, cyanosis, edema or deformities noted ?Neurological: Alert oriented x 4, grossly nonfocal ?Psychological:  Alert and cooperative. Normal mood and affect ? ? ?Assessment and Recommendations: ? ?Personal history of multiple tubular adenomas and 1 sessile serrated colon polyp in May 2020.  Schedule surveillance colonoscopy. The risks (including bleeding, perforation, infection, missed lesions, medication reactions and possible hospitalization or surgery if complications occur), benefits, and alternatives to colonoscopy with possible biopsy and possible polypectomy were discussed with the patient and they consent to proceed.   ?Mild LLQ abdominal pain, mild constipation.  Noncontrasted CT and blood work are unremarkable.  Pain is likely secondary to constipation.  Begin MiraLAX daily.  Increase dietary fiber and water intake. ?GERD.  Follow antireflux measures.  Begin omeprazole 20 mg p.o. every morning.  Symptoms not adequately controlled with this regimen consider increasing PPI dosage and proceeding with EGD. ?

## 2021-06-02 NOTE — Patient Instructions (Signed)
Start over the counter Miralax mixing 17 grams in 8 oz of water/juice daily.  ? ?We have sent the following medications to your pharmacy for you to pick up at your convenience: omeprazole 20 mg daily. ? ?Patient advised to avoid spicy, acidic, citrus, chocolate, mints, fruit and fruit juices.  Limit the intake of caffeine, alcohol and Soda.  Don't exercise too soon after eating.  Don't lie down within 3-4 hours of eating.  Elevate the head of your bed. ? ?You have been scheduled for a colonoscopy. Please follow written instructions given to you at your visit today.  ?Please pick up your prep supplies at the pharmacy within the next 1-3 days. ?If you use inhalers (even only as needed), please bring them with you on the day of your procedure. ? ?The [facility] providers would like to encourage you to use [facility] to communicate with providers for non-urgent requests or questions.  Due to long hold times on the telephone, sending your provider a message by [facility] may be a faster and more efficient way to get a response.  Please allow 48 business hours for a response.  Please remember that this is for non-urgent requests.  ? ?Due to recent changes in healthcare laws, you may see the results of your imaging and laboratory studies on MyChart before your provider has had a chance to review them.  We understand that in some cases there may be results that are confusing or concerning to you. Not all laboratory results come back in the same time frame and the provider may be waiting for multiple results in order to interpret others.  Please give [place] 48 hours in order for your provider to thoroughly review all the results before contacting the office for clarification of your results.  ? ?Thank you for choosing me and [facility]. ? ?Malcolm T. Dagoberto Ligas., MD., [facility] ? ? ?

## 2021-07-07 ENCOUNTER — Encounter: Payer: Self-pay | Admitting: Gastroenterology

## 2021-07-07 ENCOUNTER — Ambulatory Visit (AMBULATORY_SURGERY_CENTER): Payer: Medicare Other | Admitting: Gastroenterology

## 2021-07-07 VITALS — BP 117/59 | HR 60 | Temp 96.8°F | Resp 12 | Ht 76.0 in | Wt 301.0 lb

## 2021-07-07 DIAGNOSIS — D124 Benign neoplasm of descending colon: Secondary | ICD-10-CM

## 2021-07-07 DIAGNOSIS — D123 Benign neoplasm of transverse colon: Secondary | ICD-10-CM | POA: Diagnosis not present

## 2021-07-07 DIAGNOSIS — D128 Benign neoplasm of rectum: Secondary | ICD-10-CM | POA: Diagnosis not present

## 2021-07-07 DIAGNOSIS — Z86010 Personal history of colonic polyps: Secondary | ICD-10-CM | POA: Diagnosis not present

## 2021-07-07 DIAGNOSIS — G4733 Obstructive sleep apnea (adult) (pediatric): Secondary | ICD-10-CM | POA: Diagnosis not present

## 2021-07-07 DIAGNOSIS — D120 Benign neoplasm of cecum: Secondary | ICD-10-CM

## 2021-07-07 DIAGNOSIS — I10 Essential (primary) hypertension: Secondary | ICD-10-CM | POA: Diagnosis not present

## 2021-07-07 MED ORDER — SODIUM CHLORIDE 0.9 % IV SOLN: 500.0000 mL | Freq: Once | INTRAVENOUS | Status: DC

## 2021-07-07 NOTE — Patient Instructions (Signed)
Resume previous medications.  5 polyps removed and sent to pathology.  Await results for final recommendations.   ? ?Handouts on findings given to patient.   (Polyps, diverticulosis and hemorrhoids) ? ?YOU HAD AN ENDOSCOPIC PROCEDURE TODAY AT [facility]:   Refer to the procedure report that was given to you for any specific questions about what was found during the examination.  If the procedure report does not answer your questions, please call your gastroenterologist to clarify.  If you requested that your care partner not be given the details of your procedure findings, then the procedure report has been included in a sealed envelope for you to review at your convenience later. ? ?YOU SHOULD EXPECT: Some feelings of bloating in the abdomen. Passage of more gas than usual.  Walking can help get rid of the air that was put into your GI tract during the procedure and reduce the bloating. If you had a lower endoscopy (such as a colonoscopy or flexible sigmoidoscopy) you may notice spotting of blood in your stool or on the toilet paper. If you underwent a bowel prep for your procedure, you may not have a normal bowel movement for a few days. ? ?Please Note:  You might notice some irritation and congestion in your nose or some drainage.  This is from the oxygen used during your procedure.  There is no need for concern and it should clear up in a day or so. ? ?SYMPTOMS TO REPORT IMMEDIATELY: ? ?Following lower endoscopy (colonoscopy or flexible sigmoidoscopy): ? Excessive amounts of blood in the stool ? Significant tenderness or worsening of abdominal pains ? Swelling of the abdomen that is new, acute ? Fever of 100?F or higher ? ? ?For urgent or emergent issues, a gastroenterologist can be reached at any hour by calling [phone]. ?Do not use MyChart messaging for urgent concerns.  ? ? ?DIET:  We do recommend a small meal at first, but then you may proceed to your regular diet.  Drink plenty of  fluids but you should avoid alcoholic beverages for 24 hours. ? ?ACTIVITY:  You should plan to take it easy for the rest of today and you should NOT DRIVE or use heavy machinery until tomorrow (because of the sedation medicines used during the test).   ? ?FOLLOW UP: ?Our staff will call the number listed on your records 48-72 hours following your procedure to check on you and address any questions or concerns that you may have regarding the information given to you following your procedure. If we do not reach you, we will leave a message.  We will attempt to reach you two times.  During this call, we will ask if you have developed any symptoms of COVID 19. If you develop any symptoms (ie: fever, flu-like symptoms, shortness of breath, cough etc.) before then, please call [phone].  If you test positive for Covid 19 in the 2 weeks post procedure, please call and report this information to [place].   ? ?If any biopsies were taken you will be contacted by phone or by letter within the next 1-3 weeks.  Please call us at [phone] if you have not heard about the biopsies in 3 weeks.  ? ? ?SIGNATURES/CONFIDENTIALITY: ?You and/or your care partner have signed paperwork which will be entered into your electronic medical record.  These signatures attest to the fact that that the information above on your After Visit Summary has been reviewed and is understood.  Full  responsibility of the confidentiality of this discharge information lies with you and/or your care-partner.  ?

## 2021-07-07 NOTE — Progress Notes (Signed)
Called to room to assist during endoscopic procedure.  Patient ID and intended procedure confirmed with present staff. Received instructions for my participation in the procedure from the performing physician.  

## 2021-07-07 NOTE — Progress Notes (Signed)
? ?History & Physical ? ?Primary Care Physician:  [name], MD ?Primary Gastroenterologist: Lucio Edward, MD ? ?CHIEF COMPLAINT:  Personal history of colon polyps, LLQ pain, constipation  ? ?HPI: Antonio Reyes is a 76 y.o. male with a personal history of adenomatous and sessile serrated colon polyps 3 years ago, left lower quadrant pain, constipation for colonoscopy.. ? ? ?Past Medical History:  ?Diagnosis Date  ? Abdominal hernia   ? Allergic rhinitis   ? Anxiety   ? pt states no history of anxiety  ? Aortic atherosclerosis ([place]) 04/25/2018  ? noted on CT renal  ? Benign neoplasm of stomach   ? BPH (benign prostatic hypertrophy)   ? pt unaware  ? C6 cervical fracture ([facility])   ? C7,C8  ? Cancer ([place]) 2000  ? skin cancer   left ear  ? Chronic sinusitis   ? CKD (chronic kidney disease), stage III ([place])   ? pt unaware  ? Diverticulitis   ? Diverticulosis 03/27/2015  ? Moderate, Noted on colonoscopy  ? Dizziness   ? ED (erectile dysfunction)   ? GERD (gastroesophageal reflux disease)   ? History of herpes genitalis   ? History of kidney stones 04/25/2018  ? 4 mm left UVJ stone, 10 mm bladder stone, noted on CT renal  ? HLD (hyperlipidemia)   ? HTN (hypertension)   ? Hydronephrosis   ? Left, Moderate, noted on CT renal  ? Inguinal hernia 04/25/2018  ? Bilateral, noted on CT renal, pt unaware  ? Low back pain   ? Migraine   ? hx of  ? Neck pain, bilateral   ? Obesity   ? Polycythemia 2000  ? per Dr. Elsworth Soho  ? Rotator cuff tear   ? Sleep apnea   ? CPAP  ? Tubular adenoma of colon 05/2008  ? ? ?Past Surgical History:  ?Procedure Laterality Date  ? COLONOSCOPY  03/27/2015  ? CYSTOSCOPY WITH RETROGRADE PYELOGRAM, URETEROSCOPY AND STENT PLACEMENT Left 05/31/2018  ? Procedure: CYSTOSCOPY WITH RETROGRADE PYELOGRAM, URETEROSCOPY AND STENT PLACEMENT;  Surgeon: Alexis Frock, MD;  Location: [facility];  Service: Urology;  Laterality: Left;  1 HR  ? HERNIA REPAIR    ? umbilical with mesh  ? HOLMIUM LASER APPLICATION Left 1/66/0630   ? Procedure: HOLMIUM LASER APPLICATION;  Surgeon: Alexis Frock, MD;  Location: [facility];  Service: Urology;  Laterality: Left;  ? KNEE SURGERY Right   ? mensicus   2011  ? POLYPECTOMY    ? UPPER GI ENDOSCOPY  06/16/2005  ? ? ?Prior to Admission medications   ?Medication Sig Start Date End Date Taking? Authorizing Provider  ?aspirin 81 MG tablet Take 81 mg by mouth daily.   Yes [provider]  ?atorvastatin (LIPITOR) 40 MG tablet TAKE 1 TABLET EVERY DAY 05/20/21  Yes [name], MD  ?metoprolol succinate (TOPROL-XL) 50 MG 24 hr tablet TAKE 1 TABLET EVERY DAY 05/20/21  Yes [name], MD  ?montelukast (SINGULAIR) 10 MG tablet TAKE 1 TABLET EVERY DAY 05/20/21  Yes [name], MD  ?omeprazole (PRILOSEC) 20 MG capsule Take 1 capsule (20 mg total) by mouth daily. 06/02/21  Yes Ladene Artist, MD  ?fluticasone Asencion Islam) 50 MCG/ACT nasal spray USE 2 SPRAYS IN EACH NOSTRIL EVERY DAY (SUBSTITUTED FOR  FLONASE) 04/26/19   [name], MD  ?ibuprofen (ADVIL,MOTRIN) 200 MG tablet Take 200 mg by mouth every 6 (six) hours as needed for headache or moderate pain.    [provider]  ? ? ?Current Outpatient Medications  ?Medication Sig Dispense Refill  ? aspirin 81 MG tablet Take 81 mg by mouth daily.    ? atorvastatin (LIPITOR) 40 MG tablet TAKE 1 TABLET EVERY DAY 90 tablet 3  ? metoprolol succinate (TOPROL-XL) 50 MG 24 hr tablet TAKE 1 TABLET EVERY DAY 90 tablet 3  ? montelukast (SINGULAIR) 10 MG tablet TAKE 1 TABLET EVERY DAY 90 tablet 3  ? omeprazole (PRILOSEC) 20 MG capsule Take 1 capsule (20 mg total) by mouth daily. 30 capsule 11  ? fluticasone (FLONASE) 50 MCG/ACT nasal spray USE 2 SPRAYS IN EACH NOSTRIL EVERY DAY (SUBSTITUTED FOR  FLONASE) 48 g 3  ? ibuprofen (ADVIL,MOTRIN) 200 MG tablet Take 200 mg by mouth every 6 (six) hours as needed for headache or moderate pain.    ? ?Current Facility-Administered Medications  ?Medication Dose Route Frequency Provider Last Rate Last Admin  ? 0.9 %  sodium  chloride infusion  500 mL Intravenous Once Ladene Artist, MD      ? ?Facility-Administered Medications Ordered in Other Visits  ?Medication Dose Route Frequency Provider Last Rate Last Admin  ? gentamicin (GARAMYCIN) 500 mg in dextrose 5 % 100 mL IVPB  500 mg Intravenous Once Alexis Frock, MD      ? ? ?Allergies as of 07/07/2021 - Review Complete 07/07/2021  ?Allergen Reaction Noted  ? Olive tree Other (See Comments) 03/12/2015  ? Pollen extract  03/12/2015  ? ? ?Family History  ?Problem Relation Age of Onset  ? Colon cancer Mother 42  ? Pancreatic cancer Paternal Grandfather   ? Colon polyps Neg Hx   ? Esophageal cancer Neg Hx   ? Stomach cancer Neg Hx   ? Rectal cancer Neg Hx   ? ? ?Social History  ? ?Socioeconomic History  ? Marital status: Married  ?  Spouse name: Not on file  ? Number of children: 1  ? Years of education: Not on file  ? Highest education level: Not on file  ?Occupational History  ? Occupation: Magazine features editor)  ?Tobacco Use  ? Smoking status: Light Smoker  ?  Types: Cigars  ? Smokeless tobacco: Never  ? Tobacco comments:  ?  occasionally  every 3 months  ?Vaping Use  ? Vaping Use: Never used  ?Substance and Sexual Activity  ? Alcohol use: Yes  ?  Alcohol/week: 1.0 standard drink  ?  Types: 1 Standard drinks or equivalent per week  ?  Comment: rare  ? Drug use: No  ? Sexual activity: Yes  ?Other Topics Concern  ? Not on file  ?Social History Narrative  ? Not on file  ? ?Social Determinants of Health  ? ?Financial Resource Strain: Low Risk   ? Difficulty of Paying Living Expenses: Not hard at all  ?Food Insecurity: No Food Insecurity  ? Worried About Charity fundraiser in the Last Year: Never true  ? Ran Out of Food in the Last Year: Never true  ?Transportation Needs: No Transportation Needs  ? Lack of Transportation (Medical): No  ? Lack of Transportation (Non-Medical): No  ?Physical Activity: Inactive  ? Days of Exercise per Week: 0 days  ? Minutes of Exercise per  Session: 0 min  ?Stress: No Stress Concern Present  ? Feeling of Stress : Not at all  ?Social Connections: Socially Integrated  ? Frequency of Communication with Friends and Family: More than three times a week  ? Frequency of Social  Gatherings with Friends and Family: More than three times a week  ? Attends Religious Services: More than 4 times per year  ? Active Member of Clubs or Organizations: Yes  ? Attends Archivist Meetings: More than 4 times per year  ? Marital Status: Married  ?Intimate Partner Violence: Not At Risk  ? Fear of Current or Ex-Partner: No  ? Emotionally Abused: No  ? Physically Abused: No  ? Sexually Abused: No  ? ? ?Review of Systems: ? ?All systems reviewed an negative except where noted in HPI. ? ?Gen: Denies any fever, chills, sweats, anorexia, fatigue, weakness, malaise, weight loss, and sleep disorder ?CV: Denies chest pain, angina, palpitations, syncope, orthopnea, PND, peripheral edema, and claudication. ?Resp: Denies dyspnea at rest, dyspnea with exercise, cough, sputum, wheezing, coughing up blood, and pleurisy. ?GI: Denies vomiting blood, jaundice, and fecal incontinence.   Denies dysphagia or odynophagia. ?GU : Denies urinary burning, blood in urine, urinary frequency, urinary hesitancy, nocturnal urination, and urinary incontinence. ?MS: Denies joint pain, limitation of movement, and swelling, stiffness, low back pain, extremity pain. Denies muscle weakness, cramps, atrophy.  ?Derm: Denies rash, itching, dry skin, hives, moles, warts, or unhealing ulcers.  ?Psych: Denies depression, anxiety, memory loss, suicidal ideation, hallucinations, paranoia, and confusion. ?Heme: Denies bruising, bleeding, and enlarged lymph nodes. ?Neuro:  Denies any headaches, dizziness, paresthesias. ?Endo:  Denies any problems with DM, thyroid, adrenal function. ? ? ?Physical Exam: ?General:  Alert, well-developed, in NAD ?Head:  Normocephalic and atraumatic. ?Eyes:  Sclera clear, no icterus.    Conjunctiva pink. ?Ears:  Normal auditory acuity. ?Mouth:  No deformity or lesions.  ?Neck:  Supple; no masses . ?Lungs:  Clear throughout to auscultation.   No wheezes, crackles, or rhonchi. No acute distre

## 2021-07-07 NOTE — Progress Notes (Signed)
Pt's states no medical or surgical changes since previsit or office visit. 

## 2021-07-07 NOTE — Progress Notes (Signed)
Report to PACU, RN, vss, BBS= Clear.  

## 2021-07-07 NOTE — Op Note (Signed)
[name] ?Patient Name: Antonio Reyes ?Procedure Date: 07/07/2021 3:40 PM ?MRN: [id number] ?Endoscopist: Ladene Artist , MD ?Age: 76 ?Referring MD:  ?Date of Birth: [birth year] ?Gender: Male ?Account #: [id number] ?Procedure:                Colonoscopy ?Indications:              Surveillance: Personal history of multiple  ?                          adenomatous and sessile serrated polyps on last  ?                          colonoscopy 3 years ago ?Medicines:                Monitored Anesthesia Care ?Procedure:                Pre-Anesthesia Assessment: ?                          - Prior to the procedure, a History and Physical  ?                          was performed, and patient medications and  ?                          allergies were reviewed. The patient's tolerance of  ?                          previous anesthesia was also reviewed. The risks  ?                          and benefits of the procedure and the sedation  ?                          options and risks were discussed with the patient.  ?                          All questions were answered, and informed consent  ?                          was obtained. Prior Anticoagulants: The patient has  ?                          taken no previous anticoagulant or antiplatelet  ?                          agents. ASA Grade Assessment: II - A patient with  ?                          mild systemic disease. After reviewing the risks  ?                          and benefits, the patient was deemed in  ?  satisfactory condition to undergo the procedure. ?                          After obtaining informed consent, the colonoscope  ?                          was passed under direct vision. Throughout the  ?                          procedure, the patient's blood pressure, pulse, and  ?                          oxygen saturations were monitored continuously. The  ?                          CF [id number] #[id number] was introduced through the  anus  ?                          and advanced to the the cecum, identified by  ?                          appendiceal orifice and ileocecal valve. The  ?                          ileocecal valve, appendiceal orifice, and rectum  ?                          were photographed. The quality of the bowel  ?                          preparation was good. The colonoscopy was performed  ?                          without difficulty. The patient tolerated the  ?                          procedure well. ?Scope In: 3:49:53 PM ?Scope Out: 4:09:16 PM ?Scope Withdrawal Time: 0 hours 17 minutes 17 seconds  ?Total Procedure Duration: 0 hours 19 minutes 23 seconds  ?Findings:                 The perianal and digital rectal examinations were  ?                          normal. ?                          Five sessile polyps were found in the rectum,  ?                          descending colon, transverse colon (2) and cecum.  ?                          The polyps were 6 to 7 mm in size. These polyps  ?  were removed with a cold snare. Resection and  ?                          retrieval were complete. ?                          Multiple medium-mouthed diverticula were found in  ?                          the sigmoid colon, descending colon and transverse  ?                          colon. There was no evidence of diverticular  ?                          bleeding. ?                          Internal hemorrhoids were found during  ?                          retroflexion. The hemorrhoids were small and Grade  ?                          I (internal hemorrhoids that do not prolapse). ?                          The exam was otherwise without abnormality on  ?                          direct and retroflexion views. ?Complications:            No immediate complications. Estimated blood loss:  ?                          None. ?Estimated Blood Loss:     Estimated blood loss: none. ?Impression:               - Five 6 to 7  mm polyps in the rectum, in the  ?                          descending colon, in the transverse colon and in  ?                          the cecum, removed with a cold snare. Resected and  ?                          retrieved. ?                          - Moderate diverticulosis in the sigmoid colon, in  ?                          the descending colon and in the transverse colon. ?                          -  Internal hemorrhoids. ?                          - The examination was otherwise normal on direct  ?                          and retroflexion views. ?Recommendation:           - Repeat colonoscopy after studies are complete for  ?                          surveillance based on pathology results. ?                          - Patient has a contact number available for  ?                          emergencies. The signs and symptoms of potential  ?                          delayed complications were discussed with the  ?                          patient. Return to normal activities tomorrow.  ?                          Written discharge instructions were provided to the  ?                          patient. ?                          - High fiber diet. ?                          - Continue present medications. ?                          - Await pathology results. ?Ladene Artist, MD ?07/07/2021 4:13:35 PM ?This report has been signed electronically. ?

## 2021-07-09 ENCOUNTER — Telehealth: Payer: Self-pay | Admitting: *Deleted

## 2021-07-09 NOTE — Telephone Encounter (Signed)
?  Follow up Call- ? ? ?  07/07/2021  ?  3:02 PM  ?Call back number  ?Post procedure Call Back phone  # [phone]  ?Permission to leave phone message Yes  ?  ? ?Patient questions: ? ?Do you have a fever, pain , or abdominal swelling? No. ?Pain Score  0 * ? ?Have you tolerated food without any problems? Yes.   ? ?Have you been able to return to your normal activities? Yes.   ? ?Do you have any questions about your discharge instructions: ?Diet   No. ?Medications  No. ?Follow up visit  No. ? ?Do you have questions or concerns about your Care? No. ? ?Actions: ?* If pain score is 4 or above: ?No action needed, pain <4. ? ? ?

## 2021-07-27 ENCOUNTER — Encounter: Payer: Self-pay | Admitting: Gastroenterology

## 2021-08-05 ENCOUNTER — Encounter: Payer: Self-pay | Admitting: Family Medicine

## 2021-08-05 ENCOUNTER — Ambulatory Visit (INDEPENDENT_AMBULATORY_CARE_PROVIDER_SITE_OTHER): Payer: Medicare Other | Admitting: Family Medicine

## 2021-08-05 VITALS — BP 134/84 | HR 65 | Temp 97.5°F | Ht 76.0 in | Wt 293.0 lb

## 2021-08-05 DIAGNOSIS — R931 Abnormal findings on diagnostic imaging of heart and coronary circulation: Secondary | ICD-10-CM | POA: Diagnosis not present

## 2021-08-05 DIAGNOSIS — R051 Acute cough: Secondary | ICD-10-CM

## 2021-08-05 DIAGNOSIS — J069 Acute upper respiratory infection, unspecified: Secondary | ICD-10-CM | POA: Diagnosis not present

## 2021-08-05 NOTE — Patient Instructions (Signed)
Your symptoms today appear to be related to a viral illness.  ? ?Please report back if you are getting worse or do not notice any improvement in the next 2-3 days.  ? ?Specific home care recommendations today include: ? ?Decongestant: You may use OTC Guaifenesin (Mucinex plain) for congestion.  You may use Pseudoephedrine (Sudafed) only if you don't have blood pressure problems or a diagnosis of hypertension. ?Cough suppression: If you have cough from drainage, you may use over-the-counter Dextromethorphan (Delsym) as directed on the label ?Sore throat remedies:  You may use salt water gargles, warm fluids such as coffee or hot tea, or honey/tea/lemon mixture to sooth sore throat pain.  You may use OTC sore throat remedies such as Cepacol lozenges or Chloraseptic spray for sore throat pain. ?Runny nose and sneezing remedies: You may use OTC antihistamine such as Zyrtec or Benadryl, but caution as these can cause drowsiness.   ?Pain/fever relief: You may use over-the-counter Tylenol for pain or fever ?Drink extra fluids. Fluids help thin the mucus so your sinuses can drain more easily.  ?Applying either moist heat or ice packs to the sinus areas may help relieve discomfort. ?Use saline nasal sprays to help moisten your sinuses. The sprays can be found at your local drugstore.  ?

## 2021-08-05 NOTE — Progress Notes (Signed)
?Subjective: ? Antonio Reyes is a 76 y.o. male who presents for respiratory illness x 3. ?Symptoms include low grade fever, nasal congestion, post nasal drip, mild sore throat and occasional dry cough.  ?Denies chills, body aches, dizziness, headache, sinus pain, ear pain, chest pain, palpitations, shortness of breath, abdominal pain, N/V/D.  ? ?Reports feeling 50% improved since yesterday.  ? ?Using Sudafed, Nyquil, Mucinex for symptoms. ?denies sick contacts.  ? Patient is a light, occasional cigar smoker. ?No other aggravating or relieving factors.  No other c/o. ? ?Past Medical History:  ?Diagnosis Date  ? Abdominal hernia   ? Allergic rhinitis   ? Anxiety   ? pt states no history of anxiety  ? Aortic atherosclerosis ([place]) 04/25/2018  ? noted on CT renal  ? Benign neoplasm of stomach   ? BPH (benign prostatic hypertrophy)   ? pt unaware  ? C6 cervical fracture ([facility])   ? C7,C8  ? Cancer ([place]) 2000  ? skin cancer   left ear  ? Chronic sinusitis   ? CKD (chronic kidney disease), stage III ([place])   ? pt unaware  ? Diverticulitis   ? Diverticulosis 03/27/2015  ? Moderate, Noted on colonoscopy  ? Dizziness   ? ED (erectile dysfunction)   ? GERD (gastroesophageal reflux disease)   ? History of herpes genitalis   ? History of kidney stones 04/25/2018  ? 4 mm left UVJ stone, 10 mm bladder stone, noted on CT renal  ? HLD (hyperlipidemia)   ? HTN (hypertension)   ? Hydronephrosis   ? Left, Moderate, noted on CT renal  ? Inguinal hernia 04/25/2018  ? Bilateral, noted on CT renal, pt unaware  ? Low back pain   ? Migraine   ? hx of  ? Neck pain, bilateral   ? Obesity   ? Polycythemia 2000  ? per Dr. Elsworth Soho  ? Rotator cuff tear   ? Sleep apnea   ? CPAP  ? Tubular adenoma of colon 05/2008  ? ? ?ROS as in subjective ? ? ?Objective: ?BP 134/84 (BP Location: Left Arm, Patient Position: Sitting, Cuff Size: Large)   Pulse 65   Temp (!) 97.5 ?F (36.4 ?C) (Temporal)   Ht '6\' 4"'$  (1.93 m)   Wt 293 lb (132.9 kg)   SpO2 96%   BMI  35.67 kg/m?  ? ?General appearance: Alert, WD/WN, no distress, mildly ill appearing ?                            Skin: warm, no rash ?                          Head: no sinus tenderness ?                           Eyes: conjunctiva normal, corneas clear, PERRLA ?                           Ears: pearly TMs, external ear canals normal ?                         Nose: septum midline, turbinates swollen, with erythema and clear discharge ?            Mouth/throat: MMM, tongue normal, mild pharyngeal erythema ?  Neck: supple, no adenopathy, no thyromegaly, non tender ?                         Heart: RRR  ?                        Lungs: CTA bilaterally, no wheezes, rales, or rhonchi ? ?    ?Assessment  ?Encounter Diagnoses  ?Name Primary?  ? Acute upper respiratory infection Yes  ? Acute cough   ? ? ?  ?Plan: ?Medications prescribed today: none ?Counseling on OTC symptomatic management. He is reportedly 50% improved from yesterday. Antibiotic therapy not warranted. He is fine with this plan.  ? ? ?Specific home care recommendations today include: ?Only take over-the-counter (OTC) or prescription medicines for pain, discomfort, or fever as directed by your caregiver.   ?Decongestant: You may use OTC Guaifenesin (Mucinex plain) for congestion.  You may use Pseudoephedrine (Sudafed) only if you don't have blood pressure problems or a diagnosis of hypertension. ?Cough suppression: If you have cough from drainage, you may use over-the-counter Dextromethorphan (Delsym) as directed on the label ?Sore throat remedies:  You may use salt water gargles, warm fluids such as coffee or hot tea, or honey/tea/lemon mixture to sooth sore throat pain.  You may use OTC sore throat remedies such as Cepacol lozenges or Chloraseptic spray for sore throat pain. ?Runny nose and sneezing remedies: You may use OTC antihistamine such as Zyrtec or Benadryl, but caution as these can cause drowsiness.   ?Pain/fever relief: You  may use over-the-counter Tylenol for pain or fever ?Drink extra fluids. Fluids help thin the mucus so your sinuses can drain more easily.  ?Applying either moist heat or ice packs to the sinus areas may help relieve discomfort. ?Use saline nasal sprays to help moisten your sinuses. The sprays can be found at your local drugstore.  ? ?Patient was advised to call or return if worse or not improving in the next few days.   ? ?Patient voiced understanding of diagnosis, recommendations, and treatment plan. ? ?After visit summary given.  ? ?

## 2021-09-17 DIAGNOSIS — L918 Other hypertrophic disorders of the skin: Secondary | ICD-10-CM | POA: Diagnosis not present

## 2021-09-17 DIAGNOSIS — L538 Other specified erythematous conditions: Secondary | ICD-10-CM | POA: Diagnosis not present

## 2021-09-17 DIAGNOSIS — D225 Melanocytic nevi of trunk: Secondary | ICD-10-CM | POA: Diagnosis not present

## 2021-09-17 DIAGNOSIS — S30861A Insect bite (nonvenomous) of abdominal wall, initial encounter: Secondary | ICD-10-CM | POA: Diagnosis not present

## 2021-09-17 DIAGNOSIS — L821 Other seborrheic keratosis: Secondary | ICD-10-CM | POA: Diagnosis not present

## 2021-09-17 DIAGNOSIS — L814 Other melanin hyperpigmentation: Secondary | ICD-10-CM | POA: Diagnosis not present

## 2021-09-17 DIAGNOSIS — L820 Inflamed seborrheic keratosis: Secondary | ICD-10-CM | POA: Diagnosis not present

## 2021-10-14 ENCOUNTER — Ambulatory Visit (INDEPENDENT_AMBULATORY_CARE_PROVIDER_SITE_OTHER): Payer: Medicare Other | Admitting: Podiatry

## 2021-10-14 ENCOUNTER — Ambulatory Visit (INDEPENDENT_AMBULATORY_CARE_PROVIDER_SITE_OTHER): Payer: Medicare Other

## 2021-10-14 DIAGNOSIS — M722 Plantar fascial fibromatosis: Secondary | ICD-10-CM

## 2021-10-14 DIAGNOSIS — R931 Abnormal findings on diagnostic imaging of heart and coronary circulation: Secondary | ICD-10-CM | POA: Diagnosis not present

## 2021-10-14 DIAGNOSIS — M7661 Achilles tendinitis, right leg: Secondary | ICD-10-CM | POA: Diagnosis not present

## 2021-10-15 NOTE — Progress Notes (Signed)
Subjective:   Patient ID: Antonio Reyes, male   DOB: 76 y.o.   MRN: [id number]   HPI Patient presents stating the back of his right heel has started to become sore again and states the injections did not seem to get his longer relief as what we had done from approximately 2 years ago.  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  I will see him approximately 2 weeks before he leaves for his trip and we will try to get him comfortable for the trip and then when he returns we will consider shockwave therapy.  The air fracture walker is fitted to his leg properly worked well with all explanation as to how to wear it properly

## 2021-10-22 DIAGNOSIS — H5203 Hypermetropia, bilateral: Secondary | ICD-10-CM | POA: Diagnosis not present

## 2021-10-22 DIAGNOSIS — H2513 Age-related nuclear cataract, bilateral: Secondary | ICD-10-CM | POA: Diagnosis not present

## 2021-10-22 DIAGNOSIS — H524 Presbyopia: Secondary | ICD-10-CM | POA: Diagnosis not present

## 2021-10-22 DIAGNOSIS — H35372 Puckering of macula, left eye: Secondary | ICD-10-CM | POA: Diagnosis not present

## 2021-10-22 DIAGNOSIS — H43813 Vitreous degeneration, bilateral: Secondary | ICD-10-CM | POA: Diagnosis not present

## 2021-11-11 ENCOUNTER — Encounter: Payer: Self-pay | Admitting: Podiatry

## 2021-11-11 ENCOUNTER — Ambulatory Visit (INDEPENDENT_AMBULATORY_CARE_PROVIDER_SITE_OTHER): Payer: Medicare Other | Admitting: Podiatry

## 2021-11-11 DIAGNOSIS — R931 Abnormal findings on diagnostic imaging of heart and coronary circulation: Secondary | ICD-10-CM

## 2021-11-11 DIAGNOSIS — M7661 Achilles tendinitis, right leg: Secondary | ICD-10-CM

## 2021-11-12 NOTE — Progress Notes (Signed)
Subjective:   Patient ID: Antonio Reyes, male   DOB: 76 y.o.   MRN: [id number]   HPI Patient presents with chronic Achilles tendinitis right that he was supposed to go on a trip but has decided not to do it so he would like to get it treated   ROS      Objective:  Physical Exam  Neurovascular status intact with exquisite discomfort of the posterior right heel near the insertion of the Achilles mostly on the lateral side with inflammation with moderate obesity as a complicating factor     Assessment:  Posterior Achilles tendinitis right mostly on the lateral side painful when pressed     Plan:  Reviewed options available and I do not recommend surgery if we can avoid it and we discussed shockwave versus PRP and I think at this point shockwave will be the best option for him.  Patient is scheduled for EPAT shockwave therapy all conditions explained to him and I am very hopeful with 4 treatments this will solve his problem.  Patient will reappoint for this all questions answered today and I would like him to wear his boot prior to try to take the pressure off the Achilles tendon insertion

## 2021-11-25 ENCOUNTER — Other Ambulatory Visit: Payer: Medicare Other

## 2021-12-21 ENCOUNTER — Ambulatory Visit: Payer: Medicare Other | Admitting: Internal Medicine

## 2021-12-24 ENCOUNTER — Ambulatory Visit: Payer: Medicare Other | Admitting: Internal Medicine

## 2021-12-25 ENCOUNTER — Other Ambulatory Visit: Payer: Self-pay | Admitting: *Deleted

## 2021-12-25 MED ORDER — FLUTICASONE PROPIONATE 50 MCG/ACT NA SUSP: NASAL | 0 refills | Status: DC

## 2021-12-29 ENCOUNTER — Other Ambulatory Visit: Payer: Self-pay | Admitting: Internal Medicine

## 2021-12-29 ENCOUNTER — Encounter: Payer: Self-pay | Admitting: Internal Medicine

## 2021-12-29 ENCOUNTER — Ambulatory Visit (INDEPENDENT_AMBULATORY_CARE_PROVIDER_SITE_OTHER): Payer: Medicare Other | Admitting: Internal Medicine

## 2021-12-29 VITALS — BP 126/82 | HR 76 | Temp 98.1°F | Ht 76.0 in | Wt 301.0 lb

## 2021-12-29 DIAGNOSIS — Z23 Encounter for immunization: Secondary | ICD-10-CM | POA: Diagnosis not present

## 2021-12-29 DIAGNOSIS — R931 Abnormal findings on diagnostic imaging of heart and coronary circulation: Secondary | ICD-10-CM

## 2021-12-29 DIAGNOSIS — R739 Hyperglycemia, unspecified: Secondary | ICD-10-CM | POA: Diagnosis not present

## 2021-12-29 DIAGNOSIS — N1831 Chronic kidney disease, stage 3a: Secondary | ICD-10-CM

## 2021-12-29 DIAGNOSIS — E559 Vitamin D deficiency, unspecified: Secondary | ICD-10-CM | POA: Diagnosis not present

## 2021-12-29 DIAGNOSIS — E7800 Pure hypercholesterolemia, unspecified: Secondary | ICD-10-CM | POA: Diagnosis not present

## 2021-12-29 DIAGNOSIS — N179 Acute kidney failure, unspecified: Secondary | ICD-10-CM

## 2021-12-29 DIAGNOSIS — I10 Essential (primary) hypertension: Secondary | ICD-10-CM

## 2021-12-29 LAB — BASIC METABOLIC PANEL
BUN: 20 mg/dL (ref 6–23)
CO2: 27 mEq/L (ref 19–32)
Calcium: 9.7 mg/dL (ref 8.4–10.5)
Chloride: 103 mEq/L (ref 96–112)
Creatinine, Ser: 1.69 mg/dL — ABNORMAL HIGH (ref 0.40–1.50)
GFR: 39.56 mL/min — ABNORMAL LOW (ref >60.00)
Glucose, Bld: 117 mg/dL — ABNORMAL HIGH (ref 70–99)
Potassium: 4.3 mEq/L (ref 3.5–5.1)
Sodium: 139 mEq/L (ref 135–145)

## 2021-12-29 LAB — CBC WITH DIFFERENTIAL/PLATELET
Basophils Absolute: 0.1 10*3/uL (ref 0.0–0.1)
Basophils Relative: 1.4 % (ref 0.0–3.0)
Eosinophils Absolute: 0.2 10*3/uL (ref 0.0–0.7)
Eosinophils Relative: 2.5 % (ref 0.0–5.0)
HCT: 51 % (ref 39.0–52.0)
Hemoglobin: 17.4 g/dL — ABNORMAL HIGH (ref 13.0–17.0)
Lymphocytes Relative: 26.4 % (ref 12.0–46.0)
Lymphs Abs: 2.1 10*3/uL (ref 0.7–4.0)
MCHC: 34.1 g/dL (ref 30.0–36.0)
MCV: 90.9 fl (ref 78.0–100.0)
Monocytes Absolute: 0.7 10*3/uL (ref 0.1–1.0)
Monocytes Relative: 8.4 % (ref 3.0–12.0)
Neutro Abs: 5 10*3/uL (ref 1.4–7.7)
Neutrophils Relative %: 61.3 % (ref 43.0–77.0)
Platelets: 219 10*3/uL (ref 150.0–400.0)
RBC: 5.61 Mil/uL (ref 4.22–5.81)
RDW: 13.2 % (ref 11.5–15.5)
WBC: 8.1 10*3/uL (ref 4.0–10.5)

## 2021-12-29 LAB — HEPATIC FUNCTION PANEL
ALT: 36 U/L (ref 0–53)
AST: 26 U/L (ref 0–37)
Albumin: 4.3 g/dL (ref 3.5–5.2)
Alkaline Phosphatase: 93 U/L (ref 39–117)
Bilirubin, Direct: 0.2 mg/dL (ref 0.0–0.3)
Total Bilirubin: 0.8 mg/dL (ref 0.2–1.2)
Total Protein: 7.2 g/dL (ref 6.0–8.3)

## 2021-12-29 NOTE — Assessment & Plan Note (Signed)
Lab Results  Component Value Date   HGBA1C 6.0 05/20/2021   Stable, pt to continue current medical treatment  - diet, wt control, excercise

## 2021-12-29 NOTE — Assessment & Plan Note (Signed)
Last vitamin D Lab Results  Component Value Date   VD25OH 19.53 (L) 05/20/2021   Low, to increase to 2000 units oral replacement

## 2021-12-29 NOTE — Progress Notes (Signed)
Patient ID: Antonio Reyes, male   DOB: 10/20/1945, 76 y.o.   MRN: [id number]        Chief Complaint: follow up HTN, HLD and hyperglycemia        HPI:  Antonio Reyes is a 76 y.o. male here overall doing ok, Pt denies chest pain, increased sob or doe, wheezing, orthopnea, PND, increased LE swelling, palpitations, dizziness or syncope.   Pt denies polydipsia, polyuria, or new focal neuro s/s.    Pt denies fever, wt loss, night sweats, loss of appetite, or other constitutional symptoms  Taking vit d 1000 units  Due for flu shot       Wt Readings from Last 3 Encounters:  12/29/21 (!) 301 lb (136.5 kg)  08/05/21 293 lb (132.9 kg)  07/07/21 (!) 301 lb (136.5 kg)   BP Readings from Last 3 Encounters:  12/29/21 126/82  08/05/21 134/84  07/07/21 (!) 117/59         Past Medical History:  Diagnosis Date   Abdominal hernia    Allergic rhinitis    Anxiety    pt states no history of anxiety   Aortic atherosclerosis ([place]) 04/25/2018   noted on CT renal   Benign neoplasm of stomach    BPH (benign prostatic hypertrophy)    pt unaware   C6 cervical fracture (HCC)    C7,C8   Cancer ([place]) 2000   skin cancer   left ear   Chronic sinusitis    CKD (chronic kidney disease), stage III ([place])    pt unaware   Diverticulitis    Diverticulosis 03/27/2015   Moderate, Noted on colonoscopy   Dizziness    ED (erectile dysfunction)    GERD (gastroesophageal reflux disease)    History of herpes genitalis    History of kidney stones 04/25/2018   4 mm left UVJ stone, 10 mm bladder stone, noted on CT renal   HLD (hyperlipidemia)    HTN (hypertension)    Hydronephrosis    Left, Moderate, noted on CT renal   Inguinal hernia 04/25/2018   Bilateral, noted on CT renal, pt unaware   Low back pain    Migraine    hx of   Neck pain, bilateral    Obesity    Polycythemia 2000   per Dr. Elsworth Soho   Rotator cuff tear    Sleep apnea    CPAP   Tubular adenoma of colon 05/2008   Past Surgical History:   Procedure Laterality Date   COLONOSCOPY  03/27/2015   CYSTOSCOPY WITH RETROGRADE PYELOGRAM, URETEROSCOPY AND STENT PLACEMENT Left 05/31/2018   Procedure: CYSTOSCOPY WITH RETROGRADE PYELOGRAM, URETEROSCOPY AND STENT PLACEMENT;  Surgeon: Alexis Frock, MD;  Location: [facility];  Service: Urology;  Laterality: Left;  1 HR   HERNIA REPAIR     umbilical with mesh   HOLMIUM LASER APPLICATION Left 4/62/7035   Procedure: HOLMIUM LASER APPLICATION;  Surgeon: Alexis Frock, MD;  Location: [facility];  Service: Urology;  Laterality: Left;   KNEE SURGERY Right    mensicus   2011   POLYPECTOMY     UPPER GI ENDOSCOPY  06/16/2005    reports that he has been smoking cigars. He has never used smokeless tobacco. He reports current alcohol use of about 1.0 standard drink of alcohol per week. He reports that he does not use drugs. family history includes Colon cancer (age of onset: 16) in his mother; Pancreatic cancer in his paternal grandfather. Allergies  Allergen Reactions   Olive Tree  Other (See Comments)   Pollen Extract     Other reaction(s): Other (See Comments)   Current Outpatient Medications on File Prior to Visit  Medication Sig Dispense Refill   aspirin 81 MG tablet Take 81 mg by mouth daily.     atorvastatin (LIPITOR) 40 MG tablet TAKE 1 TABLET EVERY DAY 90 tablet 3   fluticasone (FLONASE) 50 MCG/ACT nasal spray USE 2 SPRAYS IN EACH NOSTRIL EVERY DAY (SUBSTITUTED FOR  FLONASE) 48 g 0   ibuprofen (ADVIL,MOTRIN) 200 MG tablet Take 200 mg by mouth every 6 (six) hours as needed for headache or moderate pain.     metoprolol succinate (TOPROL-XL) 50 MG 24 hr tablet TAKE 1 TABLET EVERY DAY 90 tablet 3   montelukast (SINGULAIR) 10 MG tablet TAKE 1 TABLET EVERY DAY 90 tablet 3   omeprazole (PRILOSEC) 20 MG capsule Take 1 capsule (20 mg total) by mouth daily. 30 capsule 11   Current Facility-Administered Medications on File Prior to Visit  Medication Dose Route Frequency Provider Last Rate Last Admin    gentamicin (GARAMYCIN) 500 mg in dextrose 5 % 100 mL IVPB  500 mg Intravenous Once Alexis Frock, MD            ROS:  All others reviewed and negative.  Objective        PE:  BP 126/82 (BP Location: Left Arm, Patient Position: Sitting, Cuff Size: Normal)   Pulse 76   Temp 98.1 F (36.7 C) (Oral)   Ht '6\' 4"'$  (1.93 m)   Wt (!) 301 lb (136.5 kg)   SpO2 98%   BMI 36.64 kg/m                 Constitutional: Pt appears in NAD               HENT: Head: NCAT.                Right Ear: External ear normal.                 Left Ear: External ear normal.                Eyes: . Pupils are equal, round, and reactive to light. Conjunctivae and EOM are normal               Nose: without d/c or deformity               Neck: Neck supple. Gross normal ROM               Cardiovascular: Normal rate and regular rhythm.                 Pulmonary/Chest: Effort normal and breath sounds without rales or wheezing.                Abd:  Soft, NT, ND, + BS, no organomegaly               Neurological: Pt is alert. At baseline orientation, motor grossly intact               Skin: Skin is warm. No rashes, no other new lesions, LE edema - none               Psychiatric: Pt behavior is normal without agitation   Micro: none  Cardiac tracings I have personally interpreted today:  none  Pertinent Radiological findings (summarize): none   Lab Results  Component Value Date  WBC 8.1 12/29/2021   HGB 17.4 (H) 12/29/2021   HCT 51.0 12/29/2021   PLT 219.0 12/29/2021   GLUCOSE 117 (H) 12/29/2021   CHOL 114 05/20/2021   TRIG 92.0 05/20/2021   HDL 40.20 05/20/2021   LDLDIRECT 148.7 04/12/2008   LDLCALC 56 05/20/2021   ALT 36 12/29/2021   AST 26 12/29/2021   NA 139 12/29/2021   K 4.3 12/29/2021   CL 103 12/29/2021   CREATININE 1.69 (H) 12/29/2021   BUN 20 12/29/2021   CO2 27 12/29/2021   TSH 1.51 05/20/2021   PSA 0.74 05/20/2021   HGBA1C 6.0 05/20/2021   Assessment/Plan:  Antonio Reyes is a 76 y.o.  White or Caucasian [1] male with  has a past medical history of Abdominal hernia, Allergic rhinitis, Anxiety, Aortic atherosclerosis ([place]) (04/25/2018), Benign neoplasm of stomach, BPH (benign prostatic hypertrophy), C6 cervical fracture ([place]), Cancer ([place]) (2000), Chronic sinusitis, CKD (chronic kidney disease), stage III ([place]), Diverticulitis, Diverticulosis (03/27/2015), Dizziness, ED (erectile dysfunction), GERD (gastroesophageal reflux disease), History of herpes genitalis, History of kidney stones (04/25/2018), HLD (hyperlipidemia), HTN (hypertension), Hydronephrosis, Inguinal hernia (04/25/2018), Low back pain, Migraine, Neck pain, bilateral, Obesity, Polycythemia (2000), Rotator cuff tear, Sleep apnea, and Tubular adenoma of colon (05/2008).  Vitamin D deficiency Last vitamin D Lab Results  Component Value Date   VD25OH 19.53 (L) 05/20/2021   Low, to increase to 2000 units oral replacement   Hyperglycemia Lab Results  Component Value Date   HGBA1C 6.0 05/20/2021   Stable, pt to continue current medical treatment  - diet, wt control, excercise   HYPERCHOLESTEROLEMIA Lab Results  Component Value Date   LDLCALC 56 05/20/2021   Stable, pt to continue current statin liptior 40 mg d   Essential hypertension BP Readings from Last 3 Encounters:  12/29/21 126/82  08/05/21 134/84  07/07/21 (!) 117/59   Stable, pt to continue medical treatment toprol xl 50 mg qd   CKD (chronic kidney disease) stage 3, GFR 30-59 ml/min (HCC) Also for f/u lab today  Followup: Return in about 6 months (around 06/30/2022).  Cathlean Cower, MD 12/29/2021 7:48 PM [facility]

## 2021-12-29 NOTE — Assessment & Plan Note (Signed)
Also for f/u lab today 

## 2021-12-29 NOTE — Patient Instructions (Signed)
Ok to increase the Vitamin D3 to 2000 units per day, as this is the usual effective dose  Please continue all other medications as before, and refills have been done if requested.  Please have the pharmacy call with any other refills you may need.  Please keep your appointments with your specialists as you may have planned  Please go to the LAB at the blood drawing area for the tests to be done  You will be contacted by phone if any changes need to be made immediately.  Otherwise, you will receive a letter about your results with an explanation, but please check with MyChart first.  Please remember to sign up for MyChart if you have not done so, as this will be important to you in the future with finding out test results, communicating by private email, and scheduling acute appointments online when needed.  Please make an Appointment to return in 6 months, or sooner if needed

## 2021-12-29 NOTE — Assessment & Plan Note (Signed)
Lab Results  Component Value Date   LDLCALC 56 05/20/2021   Stable, pt to continue current statin liptior 40 mg d

## 2021-12-29 NOTE — Assessment & Plan Note (Signed)
BP Readings from Last 3 Encounters:  12/29/21 126/82  08/05/21 134/84  07/07/21 (!) 117/59   Stable, pt to continue medical treatment toprol xl 50 mg qd

## 2022-01-05 ENCOUNTER — Other Ambulatory Visit (INDEPENDENT_AMBULATORY_CARE_PROVIDER_SITE_OTHER): Payer: Medicare Other

## 2022-01-05 DIAGNOSIS — L570 Actinic keratosis: Secondary | ICD-10-CM | POA: Diagnosis not present

## 2022-01-05 DIAGNOSIS — L821 Other seborrheic keratosis: Secondary | ICD-10-CM | POA: Diagnosis not present

## 2022-01-05 DIAGNOSIS — D492 Neoplasm of unspecified behavior of bone, soft tissue, and skin: Secondary | ICD-10-CM | POA: Diagnosis not present

## 2022-01-05 DIAGNOSIS — N179 Acute kidney failure, unspecified: Secondary | ICD-10-CM

## 2022-01-05 DIAGNOSIS — B079 Viral wart, unspecified: Secondary | ICD-10-CM | POA: Diagnosis not present

## 2022-01-05 LAB — BASIC METABOLIC PANEL
BUN: 21 mg/dL (ref 6–23)
CO2: 25 mEq/L (ref 19–32)
Calcium: 9.5 mg/dL (ref 8.4–10.5)
Chloride: 103 mEq/L (ref 96–112)
Creatinine, Ser: 1.23 mg/dL (ref 0.40–1.50)
GFR: 57.91 mL/min — ABNORMAL LOW (ref >60.00)
Glucose, Bld: 91 mg/dL (ref 70–99)
Potassium: 4.2 mEq/L (ref 3.5–5.1)
Sodium: 136 mEq/L (ref 135–145)

## 2022-01-11 ENCOUNTER — Ambulatory Visit (INDEPENDENT_AMBULATORY_CARE_PROVIDER_SITE_OTHER): Payer: Medicare Other

## 2022-01-11 ENCOUNTER — Telehealth: Payer: Self-pay

## 2022-01-11 DIAGNOSIS — Z0000 Encounter for general adult medical examination without abnormal findings: Secondary | ICD-10-CM | POA: Diagnosis not present

## 2022-01-11 NOTE — Telephone Encounter (Signed)
Patient returned call.  AWV was completed.

## 2022-01-11 NOTE — Progress Notes (Signed)
Virtual Visit via Telephone Note  I connected with  Antonio Reyes on 01/11/22 at  2:30 PM EDT by telephone and verified that I am speaking with the correct person using two identifiers.  Location: Patient: home Provider: [place] Persons participating in the virtual visit: [place]   I discussed the limitations, risks, security and privacy concerns of performing an evaluation and management service by telephone and the availability of in person appointments. The patient expressed understanding and agreed to proceed.  Interactive audio and video telecommunications were attempted between this nurse and patient, however failed, due to patient having technical difficulties OR patient did not have access to video capability.  We continued and completed visit with audio only.  Some vital signs may be absent or patient reported.   Sheral Flow, LPN  Subjective:   Antonio Reyes is a 76 y.o. male who presents for Medicare Annual/Subsequent preventive examination.  Review of Systems     Cardiac Risk Factors include: advanced age (>57mn, >>29women);dyslipidemia;family history of premature cardiovascular disease;hypertension;male gender;obesity (BMI >30kg/m2);sedentary lifestyle;smoking/ tobacco exposure     Objective:    Today's Vitals   01/11/22 1436  PainSc: 8    There is no height or weight on file to calculate BMI.     01/11/2022    2:43 PM 01/08/2021    8:47 AM 05/31/2018    1:55 PM 05/26/2018    8:31 AM 05/15/2018    9:16 AM 04/30/2018    5:00 PM 03/12/2015    9:59 AM  Advanced Directives  Does Patient Have a Medical Advance Directive? Yes Yes Yes Yes Yes No Yes  Type of AP[place]amed[place]of [place] will Living will;Healthcare Power of [place] will [place] will Living will;Healthcare Power of [place] will  Does  patient want to make changes to medical advance directive?  No - Patient declined  No - Patient declined No - Patient declined    Copy of [place] Chart? No - copy requested No - copy requested No - copy requested No - copy requested No - copy requested    Would patient like information on creating a medical advance directive?     No - Patient declined No - Patient declined     Current Medications (verified) Outpatient Encounter Medications as of 01/11/2022  Medication Sig   aspirin 81 MG tablet Take 81 mg by mouth daily.   atorvastatin (LIPITOR) 40 MG tablet TAKE 1 TABLET EVERY DAY   fluticasone (FLONASE) 50 MCG/ACT nasal spray USE 2 SPRAYS IN EACH NOSTRIL EVERY DAY (SUBSTITUTED FOR  FLONASE)   ibuprofen (ADVIL,MOTRIN) 200 MG tablet Take 200 mg by mouth every 6 (six) hours as needed for headache or moderate pain.   metoprolol succinate (TOPROL-XL) 50 MG 24 hr tablet TAKE 1 TABLET EVERY DAY   montelukast (SINGULAIR) 10 MG tablet TAKE 1 TABLET EVERY DAY   omeprazole (PRILOSEC) 20 MG capsule Take 1 capsule (20 mg total) by mouth daily.   Facility-Administered Encounter Medications as of 01/11/2022  Medication   gentamicin (GARAMYCIN) 500 mg in dextrose 5 % 100 mL IVPB    Allergies (verified) Olive tree and Pollen extract   History: Past Medical History:  Diagnosis Date   Abdominal hernia    Allergic rhinitis    Anxiety    pt states no history of anxiety   [place] 04/25[place]   noted  on CT renal   Benign neoplasm of stomach    BPH (benign prostatic hypertrophy)    pt unaware   C6 cervical fracture (HCC)    C7,C8   Cancer ([place]) 2000   skin cancer   left ear   Chronic sinusitis    CKD (chronic kidney disease), stage III ([place])    pt unaware   Diverticulitis    Diverticulosis 03/27/2015   Moderate, Noted on colonoscopy   Dizziness    ED (erectile dysfunction)    GERD (gastroesophageal reflux disease)    History of herpes genitalis     History of kidney stones 04/25/2018   4 mm left UVJ stone, 10 mm bladder stone, noted on CT renal   HLD (hyperlipidemia)    HTN (hypertension)    Hydronephrosis    Left, Moderate, noted on CT renal   Inguinal hernia 04/25/2018   Bilateral, noted on CT renal, pt unaware   Low back pain    Migraine    hx of   Neck pain, bilateral    Obesity    Polycythemia 2000   per Dr. Elsworth Soho   Rotator cuff tear    Sleep apnea    CPAP   Tubular adenoma of colon 05/2008   Past Surgical History:  Procedure Laterality Date   COLONOSCOPY  03/27/2015   CYSTOSCOPY WITH RETROGRADE PYELOGRAM, URETEROSCOPY AND STENT PLACEMENT Left 05/31/2018   Procedure: CYSTOSCOPY WITH RETROGRADE PYELOGRAM, URETEROSCOPY AND STENT PLACEMENT;  Surgeon: Alexis Frock, MD;  [facility];  Service: Urology;  Laterality: Left;  1 HR   HERNIA REPAIR     umbilical with mesh   HOLMIUM LASER APPLICATION Left 8/52/7782   Procedure: HOLMIUM LASER APPLICATION;  Surgeon: Alexis Frock, MD;  [facility];  Service: Urology;  Laterality: Left;   KNEE SURGERY Right    mensicus   2011   POLYPECTOMY     UPPER GI ENDOSCOPY  06/16/2005   Family History  Problem Relation Age of Onset   Colon cancer Mother 46   Pancreatic cancer Paternal Grandfather    Colon polyps Neg Hx    Esophageal cancer Neg Hx    Stomach cancer Neg Hx    Rectal cancer Neg Hx    Social History   Socioeconomic History   Marital status: Married    Spouse name: Not on file   Number of children: 1   Years of education: Not on file   Highest education level: Not on file  Occupational History   Occupation: Press photographer and marketing ( Pharmacist, hospital)  Tobacco Use   Smoking status: Light Smoker    Types: Cigars   Smokeless tobacco: Never   Tobacco comments:    occasionally  every 3 months  Vaping Use   Vaping Use: Never used  Substance and Sexual Activity   Alcohol use: Yes    Alcohol/week: 1.0 standard drink of alcohol    Types: 1 Standard drinks or  equivalent per week    Comment: rare   Drug use: No   Sexual activity: Yes  Other Topics Concern   Not on file  Social History Narrative   Not on file   Social Determinants of Health   Financial Resource Strain: Low Risk  (01/11/2022)   Overall Financial Resource Strain (CARDIA)    Difficulty of Paying Living Expenses: Not hard at all  Food Insecurity: No Food Insecurity (01/11/2022)   Hunger Vital Sign    Worried About Running Out of Food in the Last  Year: Never true    [place] in the Last Year: Never true  Transportation Needs: No Transportation Needs (01/11/2022)   PRAPARE - Hydrologist (Medical): No    Lack of Transportation (Non-Medical): No  Physical Activity: Inactive (01/11/2022)   Exercise Vital Sign    Days of Exercise per Week: 0 days    Minutes of Exercise per Session: 0 min  Stress: No Stress Concern Present (01/11/2022)   [place]    Feeling of Stress : Not at all  Social Connections: [place] (01/11/2022)   Social Connection and Isolation Panel [NHANES]    Frequency of Communication with Friends and Family: More than three times a week    Frequency of Social Gatherings with Friends and Family: More than three times a week    Attends Religious Services: More than 4 times per year    Active Member of [employer] or Organizations: Yes    Attends Music therapist: More than 4 times per year    Marital Status: Married    Tobacco Counseling Ready to quit: Not Answered Counseling given: Not Answered Tobacco comments: occasionally  every 3 months   Clinical Intake:  Pre-visit preparation completed: Yes  Pain : 0-10 Pain Score: 8  Pain Type: Chronic pain Pain Location: Heel Pain Orientation: Right     BMI - recorded: 36.65 (12/29/2021) Nutritional Status: BMI > 30  Obese Nutritional Risks: None Diabetes: No  How often do you  need to have someone help you when you read instructions, pamphlets, or other written materials from your doctor or pharmacy?: 1 - Never What is the last grade level you completed in school?: HS[employer]; Master's Degree  Diabetic? no  Interpreter Needed?: No  Information entered by :: Lisette Abu, LPN.   Activities of Daily Living    01/11/2022    2:59 PM  In your present state of health, do you have any difficulty performing the following activities:  Hearing? 0  Vision? 0  Difficulty concentrating or making decisions? 0  Walking or climbing stairs? 0  Dressing or bathing? 0  Doing errands, shopping? 0  Preparing Food and eating ? N  Using the Toilet? N  In the past six months, have you accidently leaked urine? N  Do you have problems with loss of bowel control? N  Managing your Medications? N  Managing your Finances? N  Housekeeping or managing your Housekeeping? N    Patient Care Team: [name], MD as PCP - General Branch, Royetta Crochet, MD as PCP - Cardiology (Cardiology) Lonia Skinner, MD as Consulting Physician (Ophthalmology)  Indicate any recent Medical Services you may have received from other than [facility] providers in the past year (date may be approximate).     Assessment:   This is a routine wellness examination for [place].  Hearing/Vision screen Hearing Screening - Comments:: Denies hearing difficulties   Vision Screening - Comments:: Wears rx glasses - up to date with routine eye exams with Gala Romney, MD.   Dietary issues and exercise activities discussed: Current Exercise Habits: The patient does not participate in regular exercise at present, Exercise limited by: orthopedic condition(s)   Goals Addressed             This Visit's Progress    My goal is to lose 50-60 pounds.        Depression Screen    01/11/2022  2:44 PM 12/29/2021    2:51 PM 08/05/2021   11:11 AM 05/20/2021    8:46 AM 05/20/2021    8:19 AM 01/08/2021    8:59 AM 11/12/2020     8:47 AM  PHQ 2/9 Scores  PHQ - 2 Score 0 0 0 0 0 0 0    Fall Risk    01/11/2022    2:44 PM 12/29/2021    2:52 PM 08/05/2021   11:11 AM 05/20/2021    8:46 AM 05/20/2021    8:19 AM  Fall Risk   Falls in the past year? 0 0 0 0 0  Number falls in past yr: 0 0 0 0 0  Injury with Fall? 0 0 0 0 0  Risk for fall due to : No Fall Risks No Fall Risks No Fall Risks    Follow up Falls prevention discussed Falls evaluation completed Falls evaluation completed      FALL RISK PREVENTION PERTAINING TO THE HOME:  Any stairs in or around the home? Yes  If so, are there any without handrails? No  Home free of loose throw rugs in walkways, pet beds, electrical cords, etc? Yes  Adequate lighting in your home to reduce risk of falls? Yes   ASSISTIVE DEVICES UTILIZED TO PREVENT FALLS:  Life alert? No  Use of a cane, walker or w/c? No  Grab bars in the bathroom? No  Shower chair or bench in shower? Yes  Elevated toilet seat or a handicapped toilet? Yes   TIMED UP AND GO:  Was the test performed? No . Phone Visit  Cognitive Function:        01/11/2022    2:59 PM  6CIT Screen  What Year? 0 points  What month? 0 points  What time? 0 points  Count back from 20 0 points  Months in reverse 0 points  Repeat phrase 0 points  Total Score 0 points    Immunizations Immunization History  Administered Date(s) Administered   Fluad Quad(high Dose 65+) 12/29/2021   H1N1 04/12/2008   Influenza Split 01/22/2008, 01/28/2009, 12/23/2012   Influenza Whole 01/22/2008, 01/28/2009   Influenza, High Dose Seasonal PF 02/13/2014, 04/21/2016, 12/21/2016, 01/17/2018, 12/23/2018, 01/07/2020   Influenza, Seasonal, Injecte, Preservative Fre 12/20/2012   Influenza-Unspecified 11/28/2020   PFIZER(Purple Top)SARS-COV-2 Vaccination 04/28/2019, 05/19/2019, 12/14/2019, 10/15/2020   Pneumococcal Conjugate-13 02/09/2013   Pneumococcal Polysaccharide-23 04/17/2014   Td 04/12/2008   Tdap 04/12/2008, 08/23/2014,  04/24/2018   Zoster Recombinat (Shingrix) 11/28/2020, 02/16/2021   Zoster, Live 04/12/2008    TDAP status: Up to date  Flu Vaccine status: Up to date  Pneumococcal vaccine status: Up to date  Covid-19 vaccine status: Completed vaccines  Qualifies for Shingles Vaccine? Yes   Zostavax completed Yes   Shingrix Completed?: Yes  Screening Tests Health Maintenance  Topic Date Due   COVID-19 Vaccine (5 - Pfizer series) 12/10/2020   COLONOSCOPY (Pts 45-50yr Insurance coverage will need to be confirmed)  07/07/2024   TETANUS/TDAP  04/24/2028   Pneumonia Vaccine 76 Years old  Completed   INFLUENZA VACCINE  Completed   Hepatitis C Screening  Completed   Zoster Vaccines- Shingrix  Completed   HPV VACCINES  Aged Out    Health Maintenance  Health Maintenance Due  Topic Date Due   COVID-19 Vaccine (5 - Pfizer series) 12/10/2020    Colorectal cancer screening: Type of screening: Colonoscopy. Completed 07/07/2021. Repeat every 3 years  Lung Cancer Screening: (Low Dose CT Chest recommended if Age 74-80years, 349  pack-year currently smoking OR have quit w/in 15years.) does not qualify.   Lung Cancer Screening Referral: no  Additional Screening:  Hepatitis C Screening: does qualify; Completed 04/05/2016  Vision Screening: Recommended annual ophthalmology exams for early detection of glaucoma and other disorders of the eye. Is the patient up to date with their annual eye exam?  Yes  Who is the provider or what is the name of the office in which the patient attends annual eye exams? Hillis Range, MD. If pt is not established with a provider, would they like to be referred to a provider to establish care? No .   Dental Screening: Recommended annual dental exams for proper oral hygiene  Community Resource Referral / Chronic Care Management: CRR required this visit?  No   CCM required this visit?  No      Plan:     I have personally reviewed and noted the following in the  patient's chart:   Medical and social history Use of alcohol, tobacco or illicit drugs  Current medications and supplements including opioid prescriptions. Patient is not currently taking opioid prescriptions. Functional ability and status Nutritional status Physical activity Advanced directives List of other physicians Hospitalizations, surgeries, and ER visits in previous 12 months Vitals Screenings to include cognitive, depression, and falls Referrals and appointments  In addition, I have reviewed and discussed with patient certain preventive protocols, quality metrics, and best practice recommendations. A written personalized care plan for preventive services as well as general preventive health recommendations were provided to patient.     Sheral Flow, LPN   30/11/2328   Nurse Notes: N/A

## 2022-01-11 NOTE — Patient Instructions (Signed)
Antonio Reyes , Thank you for taking time to come for your Medicare Wellness Visit. I appreciate your ongoing commitment to your health goals. Please review the following plan we discussed and let me know if I can assist you in the future.   These are the goals we discussed:  Goals      My goal is to lose 50-60 pounds.        This is a list of the screening recommended for you and due dates:  Health Maintenance  Topic Date Due   COVID-19 Vaccine (5 - Pfizer series) 12/10/2020   Colon Cancer Screening  07/07/2024   Tetanus Vaccine  04/24/2028   Pneumonia Vaccine  Completed   Flu Shot  Completed   Hepatitis C Screening: USPSTF Recommendation to screen - Ages 18-79 yo.  Completed   Zoster (Shingles) Vaccine  Completed   HPV Vaccine  Aged Out    Advanced directives: Yes  Conditions/risks identified: Yes  Next appointment: Follow up in one year for your annual wellness visit.   Preventive Care 74 Years and Older, Male  Preventive care refers to lifestyle choices and visits with your health care provider that can promote health and wellness. What does preventive care include? A yearly physical exam. This is also called an annual well check. Dental exams once or twice a year. Routine eye exams. Ask your health care provider how often you should have your eyes checked. Personal lifestyle choices, including: Daily care of your teeth and gums. Regular physical activity. Eating a healthy diet. Avoiding tobacco and drug use. Limiting alcohol use. Practicing safe sex. Taking low doses of aspirin every day. Taking vitamin and mineral supplements as recommended by your health care provider. What happens during an annual well check? The services and screenings done by your health care provider during your annual well check will depend on your age, overall health, lifestyle risk factors, and family history of disease. Counseling  Your health care provider may ask you questions about  your: Alcohol use. Tobacco use. Drug use. Emotional well-being. Home and relationship well-being. Sexual activity. Eating habits. History of falls. Memory and ability to understand (cognition). Work and work Statistician. Screening  You may have the following tests or measurements: Height, weight, and BMI. Blood pressure. Lipid and cholesterol levels. These may be checked every 5 years, or more frequently if you are over 48 years old. Skin check. Lung cancer screening. You may have this screening every year starting at age 58 if you have a 30-pack-year history of smoking and currently smoke or have quit within the past 15 years. Fecal occult blood test (FOBT) of the stool. You may have this test every year starting at age 70. Flexible sigmoidoscopy or colonoscopy. You may have a sigmoidoscopy every 5 years or a colonoscopy every 10 years starting at age 29. Prostate cancer screening. Recommendations will vary depending on your family history and other risks. Hepatitis C blood test. Hepatitis B blood test. Sexually transmitted disease (STD) testing. Diabetes screening. This is done by checking your blood sugar (glucose) after you have not eaten for a while (fasting). You may have this done every 1-3 years. Abdominal aortic aneurysm (AAA) screening. You may need this if you are a current or former smoker. Osteoporosis. You may be screened starting at age 54 if you are at high risk. Talk with your health care provider about your test results, treatment options, and if necessary, the need for more tests. Vaccines  Your health care provider  may recommend certain vaccines, such as: Influenza vaccine. This is recommended every year. Tetanus, diphtheria, and acellular pertussis (Tdap, Td) vaccine. You may need a Td booster every 10 years. Zoster vaccine. You may need this after age 65. Pneumococcal 13-valent conjugate (PCV13) vaccine. One dose is recommended after age 55. Pneumococcal  polysaccharide (PPSV23) vaccine. One dose is recommended after age 59. Talk to your health care provider about which screenings and vaccines you need and how often you need them. This information is not intended to replace advice given to you by your health care provider. Make sure you discuss any questions you have with your health care provider. Document Released: 04/04/2015 Document Revised: 11/26/2015 Document Reviewed: 01/07/2015 Elsevier Interactive Patient Education  2017 [employer] Prevention in the Home Falls can cause injuries. They can happen to people of all ages. There are many things you can do to make your home safe and to help prevent falls. What can I do on the outside of my home? Regularly fix the edges of walkways and driveways and fix any cracks. Remove anything that might make you trip as you walk through a door, such as a raised step or threshold. Trim any bushes or trees on the path to your home. Use bright outdoor lighting. Clear any walking paths of anything that might make someone trip, such as rocks or tools. Regularly check to see if handrails are loose or broken. Make sure that both sides of any steps have handrails. Any raised decks and porches should have guardrails on the edges. Have any leaves, snow, or ice cleared regularly. Use sand or salt on walking paths during winter. Clean up any spills in your garage right away. This includes oil or grease spills. What can I do in the bathroom? Use night lights. Install grab bars by the toilet and in the tub and shower. Do not use towel bars as grab bars. Use non-skid mats or decals in the tub or shower. If you need to sit down in the shower, use a plastic, non-slip stool. Keep the floor dry. Clean up any water that spills on the floor as soon as it happens. Remove soap buildup in the tub or shower regularly. Attach bath mats securely with double-sided non-slip rug tape. Do not have throw rugs and other  things on the floor that can make you trip. What can I do in the bedroom? Use night lights. Make sure that you have a light by your bed that is easy to reach. Do not use any sheets or blankets that are too big for your bed. They should not hang down onto the floor. Have a firm chair that has side arms. You can use this for support while you get dressed. Do not have throw rugs and other things on the floor that can make you trip. What can I do in the kitchen? Clean up any spills right away. Avoid walking on wet floors. Keep items that you use a lot in easy-to-reach places. If you need to reach something above you, use a strong step stool that has a grab bar. Keep electrical cords out of the way. Do not use floor polish or wax that makes floors slippery. If you must use wax, use non-skid floor wax. Do not have throw rugs and other things on the floor that can make you trip. What can I do with my stairs? Do not leave any items on the stairs. Make sure that there are handrails on both sides  of the stairs and use them. Fix handrails that are broken or loose. Make sure that handrails are as long as the stairways. Check any carpeting to make sure that it is firmly attached to the stairs. Fix any carpet that is loose or worn. Avoid having throw rugs at the top or bottom of the stairs. If you do have throw rugs, attach them to the floor with carpet tape. Make sure that you have a light switch at the top of the stairs and the bottom of the stairs. If you do not have them, ask someone to add them for you. What else can I do to help prevent falls? Wear shoes that: Do not have high heels. Have rubber bottoms. Are comfortable and fit you well. Are closed at the toe. Do not wear sandals. If you use a stepladder: Make sure that it is fully opened. Do not climb a closed stepladder. Make sure that both sides of the stepladder are locked into place. Ask someone to hold it for you, if possible. Clearly  mark and make sure that you can see: Any grab bars or handrails. First and last steps. Where the edge of each step is. Use tools that help you move around (mobility aids) if they are needed. These include: Canes. Walkers. Scooters. Crutches. Turn on the lights when you go into a dark area. Replace any light bulbs as soon as they burn out. Set up your furniture so you have a clear path. Avoid moving your furniture around. If any of your floors are uneven, fix them. If there are any pets around you, be aware of where they are. Review your medicines with your doctor. Some medicines can make you feel dizzy. This can increase your chance of falling. Ask your doctor what other things that you can do to help prevent falls. This information is not intended to replace advice given to you by your health care provider. Make sure you discuss any questions you have with your health care provider. Document Released: 01/02/2009 Document Revised: 08/14/2015 Document Reviewed: 04/12/2014 Elsevier Interactive Patient Education  2017 [employer].

## 2022-01-12 ENCOUNTER — Encounter: Payer: Self-pay | Admitting: Internal Medicine

## 2022-01-18 ENCOUNTER — Telehealth: Payer: Self-pay | Admitting: Podiatry

## 2022-01-18 NOTE — Telephone Encounter (Signed)
Hoping this week to have answer on the epat machine

## 2022-01-18 NOTE — Telephone Encounter (Signed)
Patient wants to know what the next course of action is going to be since the EPAT machine is still down and the pain is getting worse ?

## 2022-01-22 DIAGNOSIS — Z23 Encounter for immunization: Secondary | ICD-10-CM | POA: Diagnosis not present

## 2022-01-27 DIAGNOSIS — T85398D Other mechanical complication of other ocular prosthetic devices, implants and grafts, subsequent encounter: Secondary | ICD-10-CM | POA: Diagnosis not present

## 2022-01-27 DIAGNOSIS — H33023 Retinal detachment with multiple breaks, bilateral: Secondary | ICD-10-CM | POA: Diagnosis not present

## 2022-01-27 DIAGNOSIS — H33012 Retinal detachment with single break, left eye: Secondary | ICD-10-CM | POA: Diagnosis not present

## 2022-01-27 DIAGNOSIS — H3341 Traction detachment of retina, right eye: Secondary | ICD-10-CM | POA: Diagnosis not present

## 2022-01-27 DIAGNOSIS — H35372 Puckering of macula, left eye: Secondary | ICD-10-CM | POA: Diagnosis not present

## 2022-01-27 DIAGNOSIS — H43813 Vitreous degeneration, bilateral: Secondary | ICD-10-CM | POA: Diagnosis not present

## 2022-01-27 DIAGNOSIS — H2513 Age-related nuclear cataract, bilateral: Secondary | ICD-10-CM | POA: Diagnosis not present

## 2022-01-29 DIAGNOSIS — H33021 Retinal detachment with multiple breaks, right eye: Secondary | ICD-10-CM | POA: Diagnosis not present

## 2022-01-29 DIAGNOSIS — H3341 Traction detachment of retina, right eye: Secondary | ICD-10-CM | POA: Diagnosis not present

## 2022-01-29 NOTE — Telephone Encounter (Signed)
Attempted to call [name]. Let him know if CP progresses to go the ED; otherwise can follow up with [place] on Monday for an appointment

## 2022-02-05 DIAGNOSIS — H31092 Other chorioretinal scars, left eye: Secondary | ICD-10-CM | POA: Diagnosis not present

## 2022-02-05 DIAGNOSIS — H59811 Chorioretinal scars after surgery for detachment, right eye: Secondary | ICD-10-CM | POA: Diagnosis not present

## 2022-02-19 DIAGNOSIS — Z9889 Other specified postprocedural states: Secondary | ICD-10-CM | POA: Diagnosis not present

## 2022-02-19 DIAGNOSIS — H33312 Horseshoe tear of retina without detachment, left eye: Secondary | ICD-10-CM | POA: Diagnosis not present

## 2022-02-19 DIAGNOSIS — H33001 Unspecified retinal detachment with retinal break, right eye: Secondary | ICD-10-CM | POA: Diagnosis not present

## 2022-02-22 ENCOUNTER — Encounter: Payer: Self-pay | Admitting: Internal Medicine

## 2022-03-23 ENCOUNTER — Ambulatory Visit (INDEPENDENT_AMBULATORY_CARE_PROVIDER_SITE_OTHER): Payer: Medicare Other | Admitting: Orthopaedic Surgery

## 2022-03-23 ENCOUNTER — Ambulatory Visit: Payer: Self-pay

## 2022-03-23 ENCOUNTER — Encounter: Payer: Self-pay | Admitting: Orthopaedic Surgery

## 2022-03-23 ENCOUNTER — Ambulatory Visit (INDEPENDENT_AMBULATORY_CARE_PROVIDER_SITE_OTHER): Payer: Medicare Other

## 2022-03-23 DIAGNOSIS — M25562 Pain in left knee: Secondary | ICD-10-CM

## 2022-03-23 DIAGNOSIS — M25561 Pain in right knee: Secondary | ICD-10-CM | POA: Diagnosis not present

## 2022-03-23 DIAGNOSIS — M170 Bilateral primary osteoarthritis of knee: Secondary | ICD-10-CM | POA: Diagnosis not present

## 2022-03-23 DIAGNOSIS — M7661 Achilles tendinitis, right leg: Secondary | ICD-10-CM | POA: Diagnosis not present

## 2022-03-23 DIAGNOSIS — G8929 Other chronic pain: Secondary | ICD-10-CM

## 2022-03-23 DIAGNOSIS — M7660 Achilles tendinitis, unspecified leg: Secondary | ICD-10-CM | POA: Insufficient documentation

## 2022-03-23 NOTE — Progress Notes (Signed)
Office Visit Note   Patient: Antonio Reyes           Date of Birth: 1945/07/16           MRN: [id number] Visit Date: 03/23/2022              Requested by: [name], MD [place],  [place] [id number] PCP: [name], MD   Assessment & Plan: Visit Diagnoses:  1. Chronic pain of both knees   2. Bilateral primary osteoarthritis of knee   3. Achilles tendinitis of right lower extremity     Plan: [name] has been seen in the past for evaluation of bilateral knee pain with evidence of osteoarthritis.  He notes that they may be "a little bit worse".  He is planning a trip to [place] with his family and wants to be sure that his knees will "hold him up".  He also relates that he has been seeing the podiatrist for evaluation of chronic Achilles tendinitis.  It been suggested that he try some ultrasound but the machine has been "broken" and has not been able to have any specific treatment.  X-rays of his knee were reviewed with him and there is progressive osteoarthritis.  I do not think he is at the point where he should consider knee replacement surgery but I do think a cortisone injection would be helpful.  His trip is until mid February and I would suggest he wait 2 weeks before he received the injection.  He also has a chronic issue with Achilles tendinitis at the os calcis insertion right heel.  Now he might be a good candidate for excision of the the lesion and would refer him to Dr. Sharol Given for all of the above  Follow-Up Instructions: Return Refer to Dr. Sharol Given for evaluation of knee pain and insertional Achilles tendinitis.   Orders:  Orders Placed This Encounter  Procedures   XR KNEE 3 VIEW LEFT   XR KNEE 3 VIEW RIGHT   No orders of the defined types were placed in this encounter.     Procedures: No procedures performed   Clinical Data: No additional findings.   Subjective: Chief Complaint  Patient presents with   Right Knee - Pain   Left Knee -  Pain  Current pain in both knees but more specifically on the right related to arthritis.  He is planning a trip to [place] in February and wanted to have a cortisone injection.  He also relates having a chronic problem with right Achilles tendinitis being followed by the podiatrist and wanted a referral for consideration of surgery  HPI  Review of Systems   Objective: Vital Signs: There were no vitals taken for this visit.  Physical Exam Constitutional:      Appearance: He is well-developed.  Eyes:     Pupils: Pupils are equal, round, and reactive to light.  Pulmonary:     Effort: Pulmonary effort is normal.  Skin:    General: Skin is warm and dry.  Neurological:     Mental Status: He is alert and oriented to person, place, and time.  Psychiatric:        Behavior: Behavior normal.     Ortho Exam awake alert and oriented x 3.  Comfortable sitting.  Right heel with tenderness directly over the Achilles insertion.  He had a small pump bump.  No redness.  Skin intact.  Neurologically intact.  The remainder of the  Achilles appeared to be intact.  Right knee with some pain along the medial compartment.  No pain laterally.  Mild patella crepitation but no pain with compression.  No effusion.  Full extension and flexion over 100 degrees without instability.  Specialty Comments:  No specialty comments available.  Imaging: XR KNEE 3 VIEW LEFT  Result Date: 03/23/2022 Films of the left knee were obtained in 3 projections standing.  There is about 1 degree of varus and some mild narrowing of the medial joint space.  Small peripheral osteophytes and some sclerosis in the medial compartment of both sides of the joint.  Films are consistent with osteoarthritis predominantly in the medial compartment  XR KNEE 3 VIEW RIGHT  Result Date: 03/23/2022 Films of the right knee obtained in 3 projections standing and compared to films that he had previously.  There is progressive narrowing of the  medial joint space and about 2 to 3 degrees of varus.  No acute changes or ectopic calcification.  There are lesser changes of arthritis in the lateral and patella femoral compartments    PMFS History: Patient Active Problem List   Diagnosis Date Noted   Bilateral primary osteoarthritis of knee 03/23/2022   Achilles tendinitis 03/23/2022   Bilateral leg pain 05/20/2021   History of colonic polyps 05/20/2021   Acute sinusitis 02/19/2021   Lower abdominal pain 12/06/2020   Elevated coronary artery calcium score 12/02/2020   Smoker 11/16/2020   Vitamin D deficiency 05/09/2020   Laceration of digital nerve of left thumb 01/31/2020   Bilateral posterior neck pain 05/09/2018   Microhematuria 04/24/2018   CKD (chronic kidney disease) stage 3, GFR 30-59 ml/min ([place]) 04/24/2018   Dysuria 04/21/2018   Allergic rhinitis 09/20/2017   Nonallopathic lesion of sacral region 08/04/2017   Nonallopathic lesion of lumbosacral region 08/04/2017   Nonallopathic lesion of thoracic region 08/04/2017   Bilateral foot pain 04/22/2017   Left leg pain 04/22/2017   Bilateral groin pain 04/22/2017   Metatarsalgia 11/11/2016   Hallux rigidus 06/11/2016   Contusion of left knee 06/11/2016   Hyperglycemia 04/21/2016   Chest pain 06/05/2015   Polycythemia, secondary 02/09/2013   Dizziness 06/08/2011   Chronic sinusitis 06/08/2011   Ankle pain 08/18/2010   Morbid obesity ([place]) 04/20/2010   ABRASION, ARM 04/20/2010   CHEST PAIN 07/22/2009   GENITAL HERPES 04/12/2008   ANXIETY 04/12/2008   MIGRAINE, COMMON 04/12/2008   BPH associated with nocturia 04/12/2008   COLONIC POLYPS, HX OF 04/12/2008   LOW BACK PAIN 11/26/2006   HYPERCHOLESTEROLEMIA 11/17/2006   ERECTILE DYSFUNCTION 11/17/2006   Essential hypertension 11/17/2006   GERD 11/17/2006   OSA (obstructive sleep apnea) 11/17/2006   DIVERTICULITIS, HX OF 11/17/2006   Past Medical History:  Diagnosis Date   Abdominal hernia    Allergic rhinitis     Anxiety    pt states no history of anxiety   Aortic atherosclerosis ([place]) 04/25/2018   noted on CT renal   Benign neoplasm of stomach    BPH (benign prostatic hypertrophy)    pt unaware   C6 cervical fracture ([place])    C7,C8   Cancer ([place]) 2000   skin cancer   left ear   Chronic sinusitis    CKD (chronic kidney disease), stage III ([place])    pt unaware   Diverticulitis    Diverticulosis 03/27/2015   Moderate, Noted on colonoscopy   Dizziness    ED (erectile dysfunction)    GERD (gastroesophageal reflux disease)    History  of herpes genitalis    History of kidney stones 04/25/2018   4 mm left UVJ stone, 10 mm bladder stone, noted on CT renal   HLD (hyperlipidemia)    HTN (hypertension)    Hydronephrosis    Left, Moderate, noted on CT renal   Inguinal hernia 04/25/2018   Bilateral, noted on CT renal, pt unaware   Low back pain    Migraine    hx of   Neck pain, bilateral    Obesity    Polycythemia 2000   per Dr. Elsworth Soho   Rotator cuff tear    Sleep apnea    CPAP   Tubular adenoma of colon 05/2008    Family History  Problem Relation Age of Onset   Colon cancer Mother 35   Pancreatic cancer Paternal Grandfather    Colon polyps Neg Hx    Esophageal cancer Neg Hx    Stomach cancer Neg Hx    Rectal cancer Neg Hx     Past Surgical History:  Procedure Laterality Date   COLONOSCOPY  03/27/2015   CYSTOSCOPY WITH RETROGRADE PYELOGRAM, URETEROSCOPY AND STENT PLACEMENT Left 05/31/2018   Procedure: CYSTOSCOPY WITH RETROGRADE PYELOGRAM, URETEROSCOPY AND STENT PLACEMENT;  Surgeon: Alexis Frock, MD;  Location: [facility];  Service: Urology;  Laterality: Left;  1 HR   HERNIA REPAIR     umbilical with mesh   HOLMIUM LASER APPLICATION Left 8/88/9169   Procedure: HOLMIUM LASER APPLICATION;  Surgeon: Alexis Frock, MD;  Location: [facility];  Service: Urology;  Laterality: Left;   KNEE SURGERY Right    mensicus   2011   POLYPECTOMY     UPPER GI ENDOSCOPY  06/16/2005   Social History    Occupational History   Occupation: Nurse, learning disability ( Pharmacist, hospital)  Tobacco Use   Smoking status: Light Smoker    Types: Cigars   Smokeless tobacco: Never   Tobacco comments:    occasionally  every 3 months  Vaping Use   Vaping Use: Never used  Substance and Sexual Activity   Alcohol use: Yes    Alcohol/week: 1.0 standard drink of alcohol    Types: 1 Standard drinks or equivalent per week    Comment: rare   Drug use: No   Sexual activity: Yes     Garald Balding, MD   Note - This record has been created using Editor, commissioning.  Chart creation errors have been sought, but may not always  have been located. Such creation errors do not reflect on  the standard of medical care.

## 2022-03-25 DIAGNOSIS — H33312 Horseshoe tear of retina without detachment, left eye: Secondary | ICD-10-CM | POA: Diagnosis not present

## 2022-03-25 DIAGNOSIS — H35351 Cystoid macular degeneration, right eye: Secondary | ICD-10-CM | POA: Diagnosis not present

## 2022-03-25 DIAGNOSIS — H33011 Retinal detachment with single break, right eye: Secondary | ICD-10-CM | POA: Diagnosis not present

## 2022-03-25 DIAGNOSIS — Z9889 Other specified postprocedural states: Secondary | ICD-10-CM | POA: Diagnosis not present

## 2022-03-25 DIAGNOSIS — H59812 Chorioretinal scars after surgery for detachment, left eye: Secondary | ICD-10-CM | POA: Diagnosis not present

## 2022-04-20 ENCOUNTER — Ambulatory Visit (INDEPENDENT_AMBULATORY_CARE_PROVIDER_SITE_OTHER): Payer: Medicare Other | Admitting: Orthopedic Surgery

## 2022-04-20 DIAGNOSIS — M170 Bilateral primary osteoarthritis of knee: Secondary | ICD-10-CM

## 2022-04-20 DIAGNOSIS — M1711 Unilateral primary osteoarthritis, right knee: Secondary | ICD-10-CM | POA: Diagnosis not present

## 2022-04-20 DIAGNOSIS — M7661 Achilles tendinitis, right leg: Secondary | ICD-10-CM

## 2022-04-22 ENCOUNTER — Encounter: Payer: Self-pay | Admitting: Orthopedic Surgery

## 2022-04-22 ENCOUNTER — Ambulatory Visit: Payer: Medicare Other | Admitting: Sports Medicine

## 2022-04-22 ENCOUNTER — Encounter: Payer: Self-pay | Admitting: Sports Medicine

## 2022-04-22 ENCOUNTER — Ambulatory Visit (INDEPENDENT_AMBULATORY_CARE_PROVIDER_SITE_OTHER): Payer: Medicare Other | Admitting: Sports Medicine

## 2022-04-22 DIAGNOSIS — M7661 Achilles tendinitis, right leg: Secondary | ICD-10-CM | POA: Diagnosis not present

## 2022-04-22 DIAGNOSIS — R2689 Other abnormalities of gait and mobility: Secondary | ICD-10-CM

## 2022-04-22 DIAGNOSIS — M1711 Unilateral primary osteoarthritis, right knee: Secondary | ICD-10-CM

## 2022-04-22 DIAGNOSIS — M7731 Calcaneal spur, right foot: Secondary | ICD-10-CM | POA: Diagnosis not present

## 2022-04-22 MED ORDER — LIDOCAINE HCL (PF) 1 % IJ SOLN
5.0000 mL | INTRAMUSCULAR | Status: AC | PRN
  Administered 2022-04-22: 5 mL

## 2022-04-22 MED ORDER — METHYLPREDNISOLONE ACETATE 40 MG/ML IJ SUSP
40.0000 mg | INTRAMUSCULAR | Status: AC | PRN
  Administered 2022-04-22: 40 mg via INTRA_ARTICULAR

## 2022-04-22 NOTE — Progress Notes (Signed)
Office Visit Note   Patient: Antonio Reyes           Date of Birth: 1946/01/08           MRN: [id number] Visit Date: 04/20/2022              Requested by: [name], MD [address] [place],  [place] [address] PCP: [name], MD  Chief Complaint  Patient presents with   Right Knee - Pain   Right Leg - Pain    Right achilles tendinitis      HPI: Patient is a 77 year old gentleman who is seen in referral from Dr. Durward Fortes.  Patient complains of persistent arthritic pain in the right knee.  Patient states he also has right Achilles tendinitis at the insertion.  Patient states that he has had this injected with podiatry both the plantar fascia and the Achilles tendon.  Assessment & Plan: Visit Diagnoses:  1. Bilateral primary osteoarthritis of knee   2. Achilles tendinitis of right lower extremity     Plan: Right knee was injected he tolerated this well.  We will set him up with Dr. Rolena Infante for shockwave therapy for the Achilles and plantar fascia.  Follow-Up Instructions: No follow-ups on file.   Ortho Exam  Patient is alert, oriented, no adenopathy, well-dressed, normal affect, normal respiratory effort. Examination patient has a palpable dorsalis pedis and posterior tibial pulse he has good dorsiflexion of the ankle with the knee extended of 20 degrees.  He is tender to palpation of the origin of the plantar fascia as well as over the Achilles.  Patient also has crepitation with range of motion of the right knee collaterals and cruciates are stable there is no effusion.  Imaging: No results found. No images are attached to the encounter.  Labs: Lab Results  Component Value Date   HGBA1C 6.0 05/20/2021   HGBA1C 6.0 11/12/2020   HGBA1C 5.8 05/05/2020     Lab Results  Component Value Date   ALBUMIN 4.3 12/29/2021   ALBUMIN 4.4 05/20/2021   ALBUMIN 4.3 11/12/2020    No results found for: "MG" Lab Results  Component Value Date   VD25OH 19.53 (L)  05/20/2021   VD25OH 26.75 (L) 11/12/2020   VD25OH 22.83 (L) 05/05/2020    No results found for: "PREALBUMIN"    Latest Ref Rng & Units 12/29/2021    4:04 PM 05/20/2021    9:13 AM 05/05/2020    7:53 AM  CBC EXTENDED  WBC 4.0 - 10.5 K/uL 8.1  6.4  8.0   RBC 4.22 - 5.81 Mil/uL 5.61  5.42  5.60   Hemoglobin 13.0 - 17.0 g/dL 17.4  16.5  17.1   HCT 39.0 - 52.0 % 51.0  49.9  50.6   Platelets 150.0 - 400.0 K/uL 219.0  194.0  225.0   NEUT# 1.4 - 7.7 K/uL 5.0  3.9  4.7   Lymph# 0.7 - 4.0 K/uL 2.1  1.9  2.5      There is no height or weight on file to calculate BMI.  Orders:  No orders of the defined types were placed in this encounter.  No orders of the defined types were placed in this encounter.    Procedures: Large Joint Inj: R knee on 04/22/2022 1:02 PM Indications: pain and diagnostic evaluation Details: 22 G 1.5 in needle, anteromedial approach  Arthrogram: No  Medications: 5 mL lidocaine (PF) 1 %; 40 mg methylPREDNISolone acetate 40 MG/ML Outcome: tolerated  well, no immediate complications Procedure, treatment alternatives, risks and benefits explained, specific risks discussed. Consent was given by the patient. Immediately prior to procedure a time out was called to verify the correct patient, procedure, equipment, support staff and site/side marked as required. Patient was prepped and draped in the usual sterile fashion.      Clinical Data: No additional findings.  ROS:  All other systems negative, except as noted in the HPI. Review of Systems  Objective: Vital Signs: There were no vitals taken for this visit.  Specialty Comments:  No specialty comments available.  PMFS History: Patient Active Problem List   Diagnosis Date Noted   Bilateral primary osteoarthritis of knee 03/23/2022   Achilles tendinitis 03/23/2022   Bilateral leg pain 05/20/2021   History of colonic polyps 05/20/2021   Acute sinusitis 02/19/2021   Lower abdominal pain 12/06/2020   Elevated  coronary artery calcium score 12/02/2020   Smoker 11/16/2020   Vitamin D deficiency 05/09/2020   Laceration of digital nerve of left thumb 01/31/2020   Bilateral posterior neck pain 05/09/2018   Microhematuria 04/24/2018   CKD (chronic kidney disease) stage 3, GFR 30-59 ml/min (HCC) 04/24/2018   Dysuria 04/21/2018   Allergic rhinitis 09/20/2017   Nonallopathic lesion of sacral region 08/04/2017   Nonallopathic lesion of lumbosacral region 08/04/2017   Nonallopathic lesion of thoracic region 08/04/2017   Bilateral foot pain 04/22/2017   Left leg pain 04/22/2017   Bilateral groin pain 04/22/2017   Metatarsalgia 11/11/2016   Hallux rigidus 06/11/2016   Contusion of left knee 06/11/2016   Hyperglycemia 04/21/2016   Chest pain 06/05/2015   Polycythemia, secondary 02/09/2013   Dizziness 06/08/2011   Chronic sinusitis 06/08/2011   Ankle pain 08/18/2010   Morbid obesity ([place]) 04/20/2010   ABRASION, ARM 04/20/2010   CHEST PAIN 07/22/2009   GENITAL HERPES 04/12/2008   ANXIETY 04/12/2008   MIGRAINE, COMMON 04/12/2008   BPH associated with nocturia 04/12/2008   COLONIC POLYPS, HX OF 04/12/2008   LOW BACK PAIN 11/26/2006   HYPERCHOLESTEROLEMIA 11/17/2006   ERECTILE DYSFUNCTION 11/17/2006   Essential hypertension 11/17/2006   GERD 11/17/2006   OSA (obstructive sleep apnea) 11/17/2006   DIVERTICULITIS, HX OF 11/17/2006   Past Medical History:  Diagnosis Date   Abdominal hernia    Allergic rhinitis    Anxiety    pt states no history of anxiety   Aortic atherosclerosis ([place]) 04/25/2018   noted on CT renal   Benign neoplasm of stomach    BPH (benign prostatic hypertrophy)    pt unaware   C6 cervical fracture ([place])    C7,C8   Cancer ([place]) 2000   skin cancer   left ear   Chronic sinusitis    CKD (chronic kidney disease), stage III ([place])    pt unaware   Diverticulitis    Diverticulosis 03/27/2015   Moderate, Noted on colonoscopy   Dizziness    ED (erectile dysfunction)     GERD (gastroesophageal reflux disease)    History of herpes genitalis    History of kidney stones 04/25/2018   4 mm left UVJ stone, 10 mm bladder stone, noted on CT renal   HLD (hyperlipidemia)    HTN (hypertension)    Hydronephrosis    Left, Moderate, noted on CT renal   Inguinal hernia 04/25/2018   Bilateral, noted on CT renal, pt unaware   Low back pain    Migraine    hx of   Neck pain, bilateral    Obesity  Polycythemia 2000   per Dr. Elsworth Soho   Rotator cuff tear    Sleep apnea    CPAP   Tubular adenoma of colon 05/2008    Family History  Problem Relation Age of Onset   Colon cancer Mother 73   Pancreatic cancer Paternal Grandfather    Colon polyps Neg Hx    Esophageal cancer Neg Hx    Stomach cancer Neg Hx    Rectal cancer Neg Hx     Past Surgical History:  Procedure Laterality Date   COLONOSCOPY  03/27/2015   CYSTOSCOPY WITH RETROGRADE PYELOGRAM, URETEROSCOPY AND STENT PLACEMENT Left 05/31/2018   Procedure: CYSTOSCOPY WITH RETROGRADE PYELOGRAM, URETEROSCOPY AND STENT PLACEMENT;  Surgeon: Alexis Frock, MD;  Location: [facility];  Service: Urology;  Laterality: Left;  1 HR   HERNIA REPAIR     umbilical with mesh   HOLMIUM LASER APPLICATION Left 7/37/3668   Procedure: HOLMIUM LASER APPLICATION;  Surgeon: Alexis Frock, MD;  Location: [facility];  Service: Urology;  Laterality: Left;   KNEE SURGERY Right    mensicus   2011   POLYPECTOMY     UPPER GI ENDOSCOPY  06/16/2005   Social History   Occupational History   Occupation: Nurse, learning disability ( Pharmacist, hospital)  Tobacco Use   Smoking status: Light Smoker    Types: Cigars   Smokeless tobacco: Never   Tobacco comments:    occasionally  every 3 months  Vaping Use   Vaping Use: Never used  Substance and Sexual Activity   Alcohol use: Yes    Alcohol/week: 1.0 standard drink of alcohol    Types: 1 Standard drinks or equivalent per week    Comment: rare   Drug use: No   Sexual activity: Yes

## 2022-04-22 NOTE — Progress Notes (Signed)
Antonio Reyes - 77 y.o. male MRN [id number]  Date of birth: 1945/05/30  Office Visit Note: Visit Date: 04/22/2022 PCP: [name], MD Referred by: [name], MD  Subjective: Chief Complaint  Patient presents with   Right Heel - Pain   HPI: Antonio Reyes is a pleasant 77 y.o. male who presents today for chronic right heel pain.  Trejuan has had right foot and heel pain for more than 5+ years.  He previously was seeing podiatry and they did do at least 2 injections in the plantar fascia, unsure where the other one was located. These helped for short periods of time, but the pain returned.  Has been told he has heel spurs as well.  His pain has waxed and waned over the years, however here over the last few weeks his pain has been exacerbated and does note that this is changing the way he is ambulating. Has used a boot in the past, but currently is not using this.  Previously saw my partner, Dr. Sharol Given, who wanted him to be evaluated here and consider extracorporeal shockwave treatment.  Pertinent ROS were reviewed with the patient and found to be negative unless otherwise specified above in HPI.   Assessment & Plan: Visit Diagnoses:  1. Achilles tendinitis of right lower extremity   2. Calcaneal spur of right foot   3. Functional gait abnormality    - 1+ chronic condition with exacerbation and independent interpretation of test (foot x-rays)  Plan: Did review [facility]'s x-rays and discussed the likely etiology of his heel pain.  As he is aware, he does have rather large calcaneal spurs and enthesophyte change from the insertional Achilles.  This has been a chronic issue that has been exacerbated over the last few weeks.  He would like to avoid surgery if possible, which I feel is a good plan.  We did do a trial of extracorporeal shockwave treatment today.  He will return next week for 1 additional trial, and then decide at that point if this is giving him benefit and we wish to continue  with that treatment modality.  I did place him into a 7/16 inch heel lift bilaterally to help offload the Achilles.  In the future we may consider green sports insoles with arch support versus sending him for custom orthotics to help support the foot from the ground up.  He may use over-the-counter anti-inflammatories or topical Voltaren gel as needed.  Follow-up next week.  Follow-up: Return in about 1 week (around 04/29/2022) for for right achilles f/u (reg visit).   Meds & Orders: No orders of the defined types were placed in this encounter.  No orders of the defined types were placed in this encounter.    Procedures: Procedure: ECSWT Indications:  Heel spur, achilles tendinopathy   Procedure Details Consent: Risks of procedure as well as the alternatives and risks of each were explained to the patient.  Verbal consent for procedure obtained. Time Out: Verified patient identification, verified procedure, site was marked, verified correct patient position. The area was cleaned with alcohol swab.     The right calcaneus and achilles tendon was targeted for Extracorporeal shockwave therapy.    Preset: Heel spur/tendinopathy Power Level: 70 - 100 mJ Frequency: 7-10 Hz Impulse/cycles: 2500 Head size: Regular   Patient tolerated procedure well without immediate complications.      Clinical History: No specialty comments available.  He reports that he has been smoking cigars. He has never  used smokeless tobacco.  Recent Labs    05/20/21 0913  HGBA1C 6.0    Objective:    Physical Exam  Gen: Well-appearing, in no acute distress; non-toxic CV: Well-perfused. Warm.  Resp: Breathing unlabored on room air; no wheezing. Psych: Fluid speech in conversation; appropriate affect; normal thought process Neuro: Sensation intact throughout. No gross coordination deficits.   Ortho Exam - Right foot: Positive TTP noted over the superior aspect of the posterior calcaneus.  There is no TTP  noted over the heel pad nor the insertion of the plantar fascia.  No visible erythema, swelling or ecchymosis.  Dorsiflexion slightly limited to about 85 degrees, full range with plantarflexion.  No mental instability.  There is also TTP noted over the insertion of the Achilles tendon.  Thompson squeeze test   - Bilateral feet: There is mild loss of longitudinal arch upon standing.  Patient actually supinates with walking phase bilaterally.  He is a heel striker.  There is less of an antalgic gait with bilateral heel lifts placed.  Imaging:  *Independent review of the right foot x-ray from 04/24/2021 was reviewed and interpreted by myself.  2 views including AP and lateral were visualized.  AP view shows mild to moderate OA changes in the first MTP joint.  Lateral film shows large enthesophytes at the superior calcaneus, there is spur formation both at the insertion and separately within the tendon sheath.  Small Haglund deformity as well.  *Independent interpretation of the left foot x-ray from 04/24/2021 was reviewed and interpreted by myself. 2 views including AP and lateral femoral visualized.  There is some mild to moderate arthritic change within the first MTP joint and the IP joint of the great toe.  Mild midfoot arthritic change.  Lateral view shows a very large enthesophyte off the superior aspect of the calcaneus.    Past Medical/Family/Surgical/Social History: Medications & Allergies reviewed per EMR, new medications updated. Patient Active Problem List   Diagnosis Date Noted   Bilateral primary osteoarthritis of knee 03/23/2022   Achilles tendinitis 03/23/2022   Bilateral leg pain 05/20/2021   History of colonic polyps 05/20/2021   Acute sinusitis 02/19/2021   Lower abdominal pain 12/06/2020   Elevated coronary artery calcium score 12/02/2020   Smoker 11/16/2020   Vitamin D deficiency 05/09/2020   Laceration of digital nerve of left thumb 01/31/2020   Bilateral posterior neck pain  05/09/2018   Microhematuria 04/24/2018   CKD (chronic kidney disease) stage 3, GFR 30-59 ml/min ([place]) 04/24/2018   Dysuria 04/21/2018   Allergic rhinitis 09/20/2017   Nonallopathic lesion of sacral region 08/04/2017   Nonallopathic lesion of lumbosacral region 08/04/2017   Nonallopathic lesion of thoracic region 08/04/2017   Bilateral foot pain 04/22/2017   Left leg pain 04/22/2017   Bilateral groin pain 04/22/2017   Metatarsalgia 11/11/2016   Hallux rigidus 06/11/2016   Contusion of left knee 06/11/2016   Hyperglycemia 04/21/2016   Chest pain 06/05/2015   Polycythemia, secondary 02/09/2013   Dizziness 06/08/2011   Chronic sinusitis 06/08/2011   Ankle pain 08/18/2010   Morbid obesity ([place]) 04/20/2010   ABRASION, ARM 04/20/2010   CHEST PAIN 07/22/2009   GENITAL HERPES 04/12/2008   ANXIETY 04/12/2008   MIGRAINE, COMMON 04/12/2008   BPH associated with nocturia 04/12/2008   COLONIC POLYPS, HX OF 04/12/2008   LOW BACK PAIN 11/26/2006   HYPERCHOLESTEROLEMIA 11/17/2006   ERECTILE DYSFUNCTION 11/17/2006   Essential hypertension 11/17/2006   GERD 11/17/2006   OSA (obstructive sleep apnea) 11/17/2006  DIVERTICULITIS, HX OF 11/17/2006   Past Medical History:  Diagnosis Date   Abdominal hernia    Allergic rhinitis    Anxiety    pt states no history of anxiety   Aortic atherosclerosis ([place]) 04/25/2018   noted on CT renal   Benign neoplasm of stomach    BPH (benign prostatic hypertrophy)    pt unaware   C6 cervical fracture ([facility])    C7,C8   Cancer ([place]) 2000   skin cancer   left ear   Chronic sinusitis    CKD (chronic kidney disease), stage III ([place])    pt unaware   Diverticulitis    Diverticulosis 03/27/2015   Moderate, Noted on colonoscopy   Dizziness    ED (erectile dysfunction)    GERD (gastroesophageal reflux disease)    History of herpes genitalis    History of kidney stones 04/25/2018   4 mm left UVJ stone, 10 mm bladder stone, noted on CT renal   HLD  (hyperlipidemia)    HTN (hypertension)    Hydronephrosis    Left, Moderate, noted on CT renal   Inguinal hernia 04/25/2018   Bilateral, noted on CT renal, pt unaware   Low back pain    Migraine    hx of   Neck pain, bilateral    Obesity    Polycythemia 2000   per Dr. Elsworth Soho   Rotator cuff tear    Sleep apnea    CPAP   Tubular adenoma of colon 05/2008   Family History  Problem Relation Age of Onset   Colon cancer Mother 48   Pancreatic cancer Paternal Grandfather    Colon polyps Neg Hx    Esophageal cancer Neg Hx    Stomach cancer Neg Hx    Rectal cancer Neg Hx    Past Surgical History:  Procedure Laterality Date   COLONOSCOPY  03/27/2015   CYSTOSCOPY WITH RETROGRADE PYELOGRAM, URETEROSCOPY AND STENT PLACEMENT Left 05/31/2018   Procedure: CYSTOSCOPY WITH RETROGRADE PYELOGRAM, URETEROSCOPY AND STENT PLACEMENT;  Surgeon: Alexis Frock, MD;  Location: [facility];  Service: Urology;  Laterality: Left;  1 HR   HERNIA REPAIR     umbilical with mesh   HOLMIUM LASER APPLICATION Left 7/56/4332   Procedure: HOLMIUM LASER APPLICATION;  Surgeon: Alexis Frock, MD;  Location: [facility];  Service: Urology;  Laterality: Left;   KNEE SURGERY Right    mensicus   2011   POLYPECTOMY     UPPER GI ENDOSCOPY  06/16/2005   Social History   Occupational History   Occupation: Nurse, learning disability ( Pharmacist, hospital)  Tobacco Use   Smoking status: Light Smoker    Types: Cigars   Smokeless tobacco: Never   Tobacco comments:    occasionally  every 3 months  Vaping Use   Vaping Use: Never used  Substance and Sexual Activity   Alcohol use: Yes    Alcohol/week: 1.0 standard drink of alcohol    Types: 1 Standard drinks or equivalent per week    Comment: rare   Drug use: No   Sexual activity: Yes

## 2022-04-22 NOTE — Progress Notes (Signed)
Right foot pain for 5+ years He has had a total of 3 injections over the course of those years They have helped; but wear off rather quickly

## 2022-04-28 ENCOUNTER — Ambulatory Visit (INDEPENDENT_AMBULATORY_CARE_PROVIDER_SITE_OTHER): Payer: Medicare Other | Admitting: Sports Medicine

## 2022-04-28 ENCOUNTER — Encounter: Payer: Self-pay | Admitting: Sports Medicine

## 2022-04-28 DIAGNOSIS — M7661 Achilles tendinitis, right leg: Secondary | ICD-10-CM

## 2022-04-28 DIAGNOSIS — M7731 Calcaneal spur, right foot: Secondary | ICD-10-CM | POA: Diagnosis not present

## 2022-04-28 NOTE — Progress Notes (Signed)
Antonio Reyes - 77 y.o. male MRN [id number]  Date of birth: [birth year]  Office Visit Note: Visit Date: 04/28/2022 PCP: [name], MD Referred by: [name], MD  Subjective: Chief Complaint  Patient presents with   Right Heel - Pain   HPI: Antonio Reyes is a pleasant 77 y.o. male who presents today for chronic right heel pain.  Has been dealing with heel pain and Achilles insertional pain for many years.  We did do a trial of extracorporeal shockwave therapy last week as well as put him into a 7/16 inch heel lift bilaterally to help offload the Achilles.  He states with these treatments he feels like he is at least 50% improved from last week.  He is going on a safe trip vacation with his wife this next week, but would like to continue treatment the following week.  Pertinent ROS were reviewed with the patient and found to be negative unless otherwise specified above in HPI.   Assessment & Plan: Visit Diagnoses:  1. Achilles tendinitis of right lower extremity   2. Calcaneal spur of right foot    Plan: Discussed with Antonio Reyes that we are both pleased that the shockwave therapy and heel lift has given him at least 50% relief.  We did do a second trial of this today, we will have him follow-up in 2 weeks for repeat treatment.  He will continue inhalers heel lift bilaterally.  In 2 weeks at follow-up, as long as his pain is still improving we will likely get him started on some home rehab exercises for the Achilles and plantar fascia.  Discussed the role of custom orthotic therapy as well.  May continue over-the-counter anti-inflammatories or topical Voltaren gel as needed.  Will follow-up in 2 weeks.  Follow-up: Return in about 2 weeks (around 05/12/2022) for for right heel.   Meds & Orders: No orders of the defined types were placed in this encounter.  No orders of the defined types were placed in this encounter.    Procedures: Procedure: ECSWT Indications:  Heel spur,  achilles tendinopathy   Procedure Details Consent: Risks of procedure as well as the alternatives and risks of each were explained to the patient.  Verbal consent for procedure obtained. Time Out: Verified patient identification, verified procedure, site was marked, verified correct patient position. The area was cleaned with alcohol swab.     The right calcaneus and achilles tendon was targeted for Extracorporeal shockwave therapy.    Preset: Heel spur/tendinopathy Power Level: 90-100 mJ Frequency: 9 Hz Impulse/cycles: 2500 Head size: Regular   Patient tolerated procedure well without immediate complications.      Clinical History: No specialty comments available.  He reports that he has been smoking cigars. He has never used smokeless tobacco.  Recent Labs    05/20/21 0913  HGBA1C 6.0    Objective:    Physical Exam  Gen: Well-appearing, in no acute distress; non-toxic CV: Regular Rate. Well-perfused. Warm.  Resp: Breathing unlabored on room air; no wheezing. Psych: Fluid speech in conversation; appropriate affect; normal thought process Neuro: Sensation intact throughout. No gross coordination deficits.   Ortho Exam - Right heel/achilles: Very mild TTP noted over the superior aspect of the posterior calcaneus, although grossly improved from last visit.  Thompson test intact with plantarflexion.  Dorsiflexion limited to about 90 degrees.  No plantar fascia pain.  No overlying swelling or erythema.  Imaging: No results found.  Past Medical/Family/Surgical/Social History: Medications & Allergies  reviewed per EMR, new medications updated. Patient Active Problem List   Diagnosis Date Noted   Bilateral primary osteoarthritis of knee 03/23/2022   Achilles tendinitis 03/23/2022   Bilateral leg pain 05/20/2021   History of colonic polyps 05/20/2021   Acute sinusitis 02/19/2021   Lower abdominal pain 12/06/2020   Elevated coronary artery calcium score 12/02/2020   Smoker  11/16/2020   Vitamin D deficiency 05/09/2020   Laceration of digital nerve of left thumb 01/31/2020   Bilateral posterior neck pain 05/09/2018   Microhematuria 04/24/2018   CKD (chronic kidney disease) stage 3, GFR 30-59 ml/min (HCC) 04/24/2018   Dysuria 04/21/2018   Allergic rhinitis 09/20/2017   Nonallopathic lesion of sacral region 08/04/2017   Nonallopathic lesion of lumbosacral region 08/04/2017   Nonallopathic lesion of thoracic region 08/04/2017   Bilateral foot pain 04/22/2017   Left leg pain 04/22/2017   Bilateral groin pain 04/22/2017   Metatarsalgia 11/11/2016   Hallux rigidus 06/11/2016   Contusion of left knee 06/11/2016   Hyperglycemia 04/21/2016   Chest pain 06/05/2015   Polycythemia, secondary 02/09/2013   Dizziness 06/08/2011   Chronic sinusitis 06/08/2011   Ankle pain 08/18/2010   Morbid obesity ([place]) 04/20/2010   ABRASION, ARM 04/20/2010   CHEST PAIN 07/22/2009   GENITAL HERPES 04/12/2008   ANXIETY 04/12/2008   MIGRAINE, COMMON 04/12/2008   BPH associated with nocturia 04/12/2008   COLONIC POLYPS, HX OF 04/12/2008   LOW BACK PAIN 11/26/2006   HYPERCHOLESTEROLEMIA 11/17/2006   ERECTILE DYSFUNCTION 11/17/2006   Essential hypertension 11/17/2006   GERD 11/17/2006   OSA (obstructive sleep apnea) 11/17/2006   DIVERTICULITIS, HX OF 11/17/2006   Past Medical History:  Diagnosis Date   Abdominal hernia    Allergic rhinitis    Anxiety    pt states no history of anxiety   Aortic atherosclerosis ([place]) 04/25/2018   noted on CT renal   Benign neoplasm of stomach    BPH (benign prostatic hypertrophy)    pt unaware   C6 cervical fracture ([place])    C7,C8   Cancer ([place]) 2000   skin cancer   left ear   Chronic sinusitis    CKD (chronic kidney disease), stage III ([place])    pt unaware   Diverticulitis    Diverticulosis 03/27/2015   Moderate, Noted on colonoscopy   Dizziness    ED (erectile dysfunction)    GERD (gastroesophageal reflux disease)    History of  herpes genitalis    History of kidney stones 04/25/2018   4 mm left UVJ stone, 10 mm bladder stone, noted on CT renal   HLD (hyperlipidemia)    HTN (hypertension)    Hydronephrosis    Left, Moderate, noted on CT renal   Inguinal hernia 04/25/2018   Bilateral, noted on CT renal, pt unaware   Low back pain    Migraine    hx of   Neck pain, bilateral    Obesity    Polycythemia 2000   per Dr. Elsworth Soho   Rotator cuff tear    Sleep apnea    CPAP   Tubular adenoma of colon 05/2008   Family History  Problem Relation Age of Onset   Colon cancer Mother 31   Pancreatic cancer Paternal Grandfather    Colon polyps Neg Hx    Esophageal cancer Neg Hx    Stomach cancer Neg Hx    Rectal cancer Neg Hx    Past Surgical History:  Procedure Laterality Date   COLONOSCOPY  03/27/2015  CYSTOSCOPY WITH RETROGRADE PYELOGRAM, URETEROSCOPY AND STENT PLACEMENT Left 05/31/2018   Procedure: CYSTOSCOPY WITH RETROGRADE PYELOGRAM, URETEROSCOPY AND STENT PLACEMENT;  Surgeon: Alexis Frock, MD;  Location: [facility];  Service: Urology;  Laterality: Left;  1 HR   HERNIA REPAIR     umbilical with mesh   HOLMIUM LASER APPLICATION Left 3/73/4287   Procedure: HOLMIUM LASER APPLICATION;  Surgeon: Alexis Frock, MD;  Location: [facility];  Service: Urology;  Laterality: Left;   KNEE SURGERY Right    mensicus   2011   POLYPECTOMY     UPPER GI ENDOSCOPY  06/16/2005   Social History   Occupational History   Occupation: Nurse, learning disability ( Pharmacist, hospital)  Tobacco Use   Smoking status: Light Smoker    Types: Cigars   Smokeless tobacco: Never   Tobacco comments:    occasionally  every 3 months  Vaping Use   Vaping Use: Never used  Substance and Sexual Activity   Alcohol use: Yes    Alcohol/week: 1.0 standard drink of alcohol    Types: 1 Standard drinks or equivalent per week    Comment: rare   Drug use: No   Sexual activity: Yes

## 2022-04-29 DIAGNOSIS — H35351 Cystoid macular degeneration, right eye: Secondary | ICD-10-CM | POA: Diagnosis not present

## 2022-04-29 DIAGNOSIS — H3521 Other non-diabetic proliferative retinopathy, right eye: Secondary | ICD-10-CM | POA: Diagnosis not present

## 2022-04-29 DIAGNOSIS — H33312 Horseshoe tear of retina without detachment, left eye: Secondary | ICD-10-CM | POA: Diagnosis not present

## 2022-04-29 DIAGNOSIS — Z9889 Other specified postprocedural states: Secondary | ICD-10-CM | POA: Diagnosis not present

## 2022-04-29 DIAGNOSIS — T85398A Other mechanical complication of other ocular prosthetic devices, implants and grafts, initial encounter: Secondary | ICD-10-CM | POA: Diagnosis not present

## 2022-04-29 DIAGNOSIS — H59812 Chorioretinal scars after surgery for detachment, left eye: Secondary | ICD-10-CM | POA: Diagnosis not present

## 2022-05-11 ENCOUNTER — Encounter: Payer: Self-pay | Admitting: Sports Medicine

## 2022-05-11 ENCOUNTER — Ambulatory Visit (INDEPENDENT_AMBULATORY_CARE_PROVIDER_SITE_OTHER): Payer: Medicare Other | Admitting: Sports Medicine

## 2022-05-11 DIAGNOSIS — R29898 Other symptoms and signs involving the musculoskeletal system: Secondary | ICD-10-CM | POA: Diagnosis not present

## 2022-05-11 DIAGNOSIS — M7731 Calcaneal spur, right foot: Secondary | ICD-10-CM

## 2022-05-11 DIAGNOSIS — M170 Bilateral primary osteoarthritis of knee: Secondary | ICD-10-CM

## 2022-05-11 DIAGNOSIS — M7661 Achilles tendinitis, right leg: Secondary | ICD-10-CM

## 2022-05-11 NOTE — Progress Notes (Signed)
Doing a lot better; states he was doing well while on vacation (surfing) His main concern today is his right knee

## 2022-05-11 NOTE — Progress Notes (Signed)
Antonio Reyes - 77 y.o. male MRN [id number]  Date of birth: 1945-08-11  Office Visit Note: Visit Date: 05/11/2022 PCP: [name], MD Referred by: [name], MD  Subjective: Chief Complaint  Patient presents with   Right Heel - Follow-up   HPI: Antonio Reyes is a pleasant 77 y.o. male who presents today for chronic right heel pain.  As a reminder he has been dealing with heel pain and Achilles insertional pain for many years. We have done only 2 sessions of extracorporeal shockwave therapy and from start of treatment, he feels he is at least 70% improved.  Recently returned from a surf trip vacation with his wife.  Continues in his heel lifts for each shoe.  He has noticed that both his hips and his knees are rather tight.  He did do some yoga while on his trip and feels like this would be beneficial for him.  He does have chronic knee pain with known osteoarthritis as well.  He is looking for some sort of treatment or rehab that will help improve his range of motion and flexibility.  Pertinent ROS were reviewed with the patient and found to be negative unless otherwise specified above in HPI.   Assessment & Plan: Visit Diagnoses:  1. Achilles tendinitis of right lower extremity   2. Calcaneal spur of right foot   3. Hip tightness   4. Bilateral primary osteoarthritis of knee    Plan: Discussed with Shanon Brow.  That he is greatly improved from the extracorporeal shockwave therapy for his insertional Achilles tendinitis and heel spur.  We will continue with shockwave treatment at weekly intervals.  Given his improvement, we did print out a customized home rehab exercise plan for him, my athletic trainer, Lilia Pro, did review these in detail with him in the room today.  He will start with this 3 times weekly.  He is working on integrating his holistic treatment approach, working on weight loss.  For his bilateral tightness in the hips and knees, we discussed getting him into  formalized physical therapy, will send referral to [facility] -> do think he would benefit from therapy based Tai-Chi or yoga-based treatment, appreciate her recs.  He does have advancing arthritis of the knees, I do think therapy may benefit this as well. He will follow-up next week for reevaluation.   Follow-up: Return in about 1 week (around 05/18/2022) for Wed or Thurs for right heel/achilles.   Meds & Orders: No orders of the defined types were placed in this encounter.  No orders of the defined types were placed in this encounter.    Procedures: Procedure: ECSWT Indications:  Heel spur, achilles tendinopathy   Procedure Details Consent: Risks of procedure as well as the alternatives and risks of each were explained to the patient.  Verbal consent for procedure obtained. Time Out: Verified patient identification, verified procedure, site was marked, verified correct patient position. The area was cleaned with alcohol swab.     The right calcaneus and achilles tendon was targeted for Extracorporeal shockwave therapy.    Preset: Heel spur/tendinopathy Power Level: 100 mJ Frequency: 10 Hz Impulse/cycles: 2500 Head size: Regular   Patient tolerated procedure well without immediate complications.      Clinical History: No specialty comments available.  He reports that he has been smoking cigars. He has never used smokeless tobacco.  Recent Labs    05/20/21 0913  HGBA1C 6.0    Objective:    Physical Exam  Gen: Well-appearing, in no acute distress; non-toxic CV: Regular Rate. Well-perfused. Warm.  Resp: Breathing unlabored on room air; no wheezing. Psych: Fluid speech in conversation; appropriate affect; normal thought process Neuro: Sensation intact throughout. No gross coordination deficits.   Ortho Exam - Right heel/achilles: + Mild TTP noted over the medial superior aspect of the posterior calcaneus.  No TTP of the Achilles tendon.  There is full plantarflexion with  dorsiflexion to about 95 degrees.  No pain upon palpation of the plantar aspect of the heel or plantar fascia.  No overlying swelling or erythema.  - Hips: No bony TTP.  There is some restriction with flexion and external rotation about bilateral hips.  Imaging:  - Xray right knee 03/23/22: Films of the right knee obtained in 3 projections standing and compared to  films that he had previously.  There is progressive narrowing of the  medial joint space and about 2 to 3 degrees of varus.  No acute changes or  ectopic calcification.  There are lesser changes of arthritis in the  lateral and patella femoral compartments   Past Medical/Family/Surgical/Social History: Medications & Allergies reviewed per EMR, new medications updated. Patient Active Problem List   Diagnosis Date Noted   Bilateral primary osteoarthritis of knee 03/23/2022   Achilles tendinitis 03/23/2022   Bilateral leg pain 05/20/2021   History of colonic polyps 05/20/2021   Acute sinusitis 02/19/2021   Lower abdominal pain 12/06/2020   Elevated coronary artery calcium score 12/02/2020   Smoker 11/16/2020   Vitamin D deficiency 05/09/2020   Laceration of digital nerve of left thumb 01/31/2020   Bilateral posterior neck pain 05/09/2018   Microhematuria 04/24/2018   CKD (chronic kidney disease) stage 3, GFR 30-59 ml/min ([place]) 04/24/2018   Dysuria 04/21/2018   Allergic rhinitis 09/20/2017   Nonallopathic lesion of sacral region 08/04/2017   Nonallopathic lesion of lumbosacral region 08/04/2017   Nonallopathic lesion of thoracic region 08/04/2017   Bilateral foot pain 04/22/2017   Left leg pain 04/22/2017   Bilateral groin pain 04/22/2017   Metatarsalgia 11/11/2016   Hallux rigidus 06/11/2016   Contusion of left knee 06/11/2016   Hyperglycemia 04/21/2016   Chest pain 06/05/2015   Polycythemia, secondary 02/09/2013   Dizziness 06/08/2011   Chronic sinusitis 06/08/2011   Ankle pain 08/18/2010   Morbid obesity ([place])  04/20/2010   ABRASION, ARM 04/20/2010   CHEST PAIN 07/22/2009   GENITAL HERPES 04/12/2008   ANXIETY 04/12/2008   MIGRAINE, COMMON 04/12/2008   BPH associated with nocturia 04/12/2008   COLONIC POLYPS, HX OF 04/12/2008   LOW BACK PAIN 11/26/2006   HYPERCHOLESTEROLEMIA 11/17/2006   ERECTILE DYSFUNCTION 11/17/2006   Essential hypertension 11/17/2006   GERD 11/17/2006   OSA (obstructive sleep apnea) 11/17/2006   DIVERTICULITIS, HX OF 11/17/2006   Past Medical History:  Diagnosis Date   Abdominal hernia    Allergic rhinitis    Anxiety    pt states no history of anxiety   Aortic atherosclerosis ([place]) 04/25/2018   noted on CT renal   Benign neoplasm of stomach    BPH (benign prostatic hypertrophy)    pt unaware   C6 cervical fracture ([place])    C7,C8   Cancer ([place]) 2000   skin cancer   left ear   Chronic sinusitis    CKD (chronic kidney disease), stage III ([place])    pt unaware   Diverticulitis    Diverticulosis 03/27/2015   Moderate, Noted on colonoscopy   Dizziness    ED (erectile  dysfunction)    GERD (gastroesophageal reflux disease)    History of herpes genitalis    History of kidney stones 04/25/2018   4 mm left UVJ stone, 10 mm bladder stone, noted on CT renal   HLD (hyperlipidemia)    HTN (hypertension)    Hydronephrosis    Left, Moderate, noted on CT renal   Inguinal hernia 04/25/2018   Bilateral, noted on CT renal, pt unaware   Low back pain    Migraine    hx of   Neck pain, bilateral    Obesity    Polycythemia 2000   per Dr. Elsworth Soho   Rotator cuff tear    Sleep apnea    CPAP   Tubular adenoma of colon 05/2008   Family History  Problem Relation Age of Onset   Colon cancer Mother 77   Pancreatic cancer Paternal Grandfather    Colon polyps Neg Hx    Esophageal cancer Neg Hx    Stomach cancer Neg Hx    Rectal cancer Neg Hx    Past Surgical History:  Procedure Laterality Date   COLONOSCOPY  03/27/2015   CYSTOSCOPY WITH RETROGRADE PYELOGRAM, URETEROSCOPY  AND STENT PLACEMENT Left 05/31/2018   Procedure: CYSTOSCOPY WITH RETROGRADE PYELOGRAM, URETEROSCOPY AND STENT PLACEMENT;  Surgeon: Alexis Frock, MD;  Location: [facility];  Service: Urology;  Laterality: Left;  1 HR   HERNIA REPAIR     umbilical with mesh   HOLMIUM LASER APPLICATION Left [id number]   Procedure: HOLMIUM LASER APPLICATION;  Surgeon: Alexis Frock, MD;  Location: [facility];  Service: Urology;  Laterality: Left;   KNEE SURGERY Right    mensicus   2011   POLYPECTOMY     UPPER GI ENDOSCOPY  06/16/2005   Social History   Occupational History   Occupation: Nurse, learning disability ( Pharmacist, hospital)  Tobacco Use   Smoking status: Light Smoker    Types: Cigars   Smokeless tobacco: Never   Tobacco comments:    occasionally  every 3 months  Vaping Use   Vaping Use: Never used  Substance and Sexual Activity   Alcohol use: Yes    Alcohol/week: 1.0 standard drink of alcohol    Types: 1 Standard drinks or equivalent per week    Comment: rare   Drug use: No   Sexual activity: Yes    I spent 36 minutes in the care of the patient today including face-to-face time, preparation to see the patient, as well as review of right and left knee x-ray 03/23/2022; review of previous orthopedic notes; discussion on home exercise plan and holistic treatment options such as dietary or nutritional measures, PT or home therapy such as yoga or tai chi for the above diagnoses.   Elba Barman, DO Primary Care Sports Medicine Physician  [place]  This note was dictated using Dragon naturally speaking software and may contain errors in syntax, spelling, or content which have not been identified prior to signing this note.

## 2022-05-11 NOTE — Progress Notes (Signed)
Patient was instructed in 10 minutes of therapeutic exercises for right achilles to improve strength, ROM and function according to my instructions and plan of care by a Certified Athletic Trainer during the office visit. A customized handout was provided and demonstration of proper technique shown and discussed. Patient did perform exercises and demonstrate understanding through teachback.  All questions discussed and answered.

## 2022-05-12 DIAGNOSIS — H338 Other retinal detachments: Secondary | ICD-10-CM | POA: Diagnosis not present

## 2022-05-12 DIAGNOSIS — H25813 Combined forms of age-related cataract, bilateral: Secondary | ICD-10-CM | POA: Diagnosis not present

## 2022-05-12 NOTE — Addendum Note (Signed)
Addended by: Renne Musca III on: 05/12/2022 02:09 PM   Modules accepted: Orders

## 2022-05-18 ENCOUNTER — Encounter: Payer: Self-pay | Admitting: Sports Medicine

## 2022-05-18 ENCOUNTER — Ambulatory Visit (INDEPENDENT_AMBULATORY_CARE_PROVIDER_SITE_OTHER): Payer: Medicare Other | Admitting: Sports Medicine

## 2022-05-18 DIAGNOSIS — M7661 Achilles tendinitis, right leg: Secondary | ICD-10-CM | POA: Diagnosis not present

## 2022-05-18 DIAGNOSIS — M7731 Calcaneal spur, right foot: Secondary | ICD-10-CM

## 2022-05-18 NOTE — Progress Notes (Signed)
Antonio Reyes - 77 y.o. male MRN [id number]  Date of birth: 24-Mar-1945  Office Visit Note: Visit Date: 05/18/2022 PCP: [name], MD Referred by: [name], MD  Subjective: Chief Complaint  Patient presents with   Right Heel - Follow-up   HPI: Antonio Reyes is a pleasant 77 y.o. male who presents today for follow-up of right chronic heel pain and insertional achilles tendinopathy.  He continues to find great improvement with the shockwave treatment, he will fax in home exercise/stretches.  He feels like he is nearing 100% improved, but is not quite 100%.  Each week gets better.  He is tolerating treatment well.  Did get a call from PT to schedule appt - needs to reschedule due to post-op Optho appt.  ECSWT #4 today.  Pertinent ROS were reviewed with the patient and found to be negative unless otherwise specified above in HPI.   Assessment & Plan: Visit Diagnoses:  1. Achilles tendinitis of right lower extremity   2. Calcaneal spur of right foot    Plan: Both elbows are pleased that he has had such great improvement with the above treatment modalities.  He has an eye procedure next week, we will see him back in about 2 weeks and consider ECSWT #5 at that time.  If he continues with his improvement, we will discuss either spacing out treatments or reevaluating in 1 month to see how he is doing with punitive effect.  He will continue with his home exercises and stretching and the heel lifts bilaterally.  Will meet with [name] for his pilates-based/Tai Chi PT.   Follow-up: Return in about 2 weeks (around 06/01/2022) for for right heel .   Meds & Orders: No orders of the defined types were placed in this encounter.  No orders of the defined types were placed in this encounter.    Procedures: Procedure: ECSWT Indications:  Heel spur, achilles tendinopathy   Procedure Details Consent: Risks of procedure as well as the alternatives and risks of each were explained to  the patient.  Verbal consent for procedure obtained. Time Out: Verified patient identification, verified procedure, site was marked, verified correct patient position. The area was cleaned with alcohol swab.     The right calcaneus and achilles tendon was targeted for Extracorporeal shockwave therapy.    Preset: Heel spur/tendinopathy Power Level: 100 mJ Frequency: 10 Hz  Impulse/cycles: 2500 Head size: Regular   Patient tolerated procedure well without immediate complications.       Clinical History: No specialty comments available.  He reports that he has been smoking cigars. He has never used smokeless tobacco.  Recent Labs    05/20/21 0913  HGBA1C 6.0    Objective:    Physical Exam  Gen: Well-appearing, in no acute distress; non-toxic CV: Well-perfused. Warm.  Resp: Breathing unlabored on room air; no wheezing. Psych: Fluid speech in conversation; appropriate affect; normal thought process Neuro: Sensation intact throughout. No gross coordination deficits.   Ortho Exam - Right heel/achilles: No true TTP noted over the medial superior aspect of the posterior calcaneus.  No TTP of the Achilles tendon.  There is full plantarflexion with improved dorsiflexion to about 105 degrees.  No pain upon palpation of the plantar aspect of the heel or plantar fascia.  No overlying swelling or erythema.   Imaging: No results found.  Past Medical/Family/Surgical/Social History: Medications & Allergies reviewed per EMR, new medications updated. Patient Active Problem List   Diagnosis Date Noted  Bilateral primary osteoarthritis of knee 03/23/2022   Achilles tendinitis 03/23/2022   Bilateral leg pain 05/20/2021   History of colonic polyps 05/20/2021   Acute sinusitis 02/19/2021   Lower abdominal pain 12/06/2020   Elevated coronary artery calcium score 12/02/2020   Smoker 11/16/2020   Vitamin D deficiency 05/09/2020   Laceration of digital nerve of left thumb 01/31/2020    Bilateral posterior neck pain 05/09/2018   Microhematuria 04/24/2018   CKD (chronic kidney disease) stage 3, GFR 30-59 ml/min (HCC) 04/24/2018   Dysuria 04/21/2018   Allergic rhinitis 09/20/2017   Nonallopathic lesion of sacral region 08/04/2017   Nonallopathic lesion of lumbosacral region 08/04/2017   Nonallopathic lesion of thoracic region 08/04/2017   Bilateral foot pain 04/22/2017   Left leg pain 04/22/2017   Bilateral groin pain 04/22/2017   Metatarsalgia 11/11/2016   Hallux rigidus 06/11/2016   Contusion of left knee 06/11/2016   Hyperglycemia 04/21/2016   Chest pain 06/05/2015   Polycythemia, secondary 02/09/2013   Dizziness 06/08/2011   Chronic sinusitis 06/08/2011   Ankle pain 08/18/2010   Morbid obesity ([place]) 04/20/2010   ABRASION, ARM 04/20/2010   CHEST PAIN 07/22/2009   GENITAL HERPES 04/12/2008   ANXIETY 04/12/2008   MIGRAINE, COMMON 04/12/2008   BPH associated with nocturia 04/12/2008   COLONIC POLYPS, HX OF 04/12/2008   LOW BACK PAIN 11/26/2006   HYPERCHOLESTEROLEMIA 11/17/2006   ERECTILE DYSFUNCTION 11/17/2006   Essential hypertension 11/17/2006   GERD 11/17/2006   OSA (obstructive sleep apnea) 11/17/2006   DIVERTICULITIS, HX OF 11/17/2006   Past Medical History:  Diagnosis Date   Abdominal hernia    Allergic rhinitis    Anxiety    pt states no history of anxiety   Aortic atherosclerosis ([place]) 04/25/2018   noted on CT renal   Benign neoplasm of stomach    BPH (benign prostatic hypertrophy)    pt unaware   C6 cervical fracture ([place])    C7,C8   Cancer ([place]) 2000   skin cancer   left ear   Chronic sinusitis    CKD (chronic kidney disease), stage III ([place])    pt unaware   Diverticulitis    Diverticulosis 03/27/2015   Moderate, Noted on colonoscopy   Dizziness    ED (erectile dysfunction)    GERD (gastroesophageal reflux disease)    History of herpes genitalis    History of kidney stones 04/25/2018   4 mm left UVJ stone, 10 mm bladder stone,  noted on CT renal   HLD (hyperlipidemia)    HTN (hypertension)    Hydronephrosis    Left, Moderate, noted on CT renal   Inguinal hernia 04/25/2018   Bilateral, noted on CT renal, pt unaware   Low back pain    Migraine    hx of   Neck pain, bilateral    Obesity    Polycythemia 2000   per Dr. Elsworth Soho   Rotator cuff tear    Sleep apnea    CPAP   Tubular adenoma of colon 05/2008   Family History  Problem Relation Age of Onset   Colon cancer Mother 39   Pancreatic cancer Paternal Grandfather    Colon polyps Neg Hx    Esophageal cancer Neg Hx    Stomach cancer Neg Hx    Rectal cancer Neg Hx    Past Surgical History:  Procedure Laterality Date   COLONOSCOPY  03/27/2015   CYSTOSCOPY WITH RETROGRADE PYELOGRAM, URETEROSCOPY AND STENT PLACEMENT Left 05/31/2018   Procedure: CYSTOSCOPY WITH RETROGRADE  PYELOGRAM, URETEROSCOPY AND STENT PLACEMENT;  Surgeon: Alexis Frock, MD;  Location: [facility];  Service: Urology;  Laterality: Left;  1 HR   HERNIA REPAIR     umbilical with mesh   HOLMIUM LASER APPLICATION Left [id number]   Procedure: HOLMIUM LASER APPLICATION;  Surgeon: Alexis Frock, MD;  Location: [facility];  Service: Urology;  Laterality: Left;   KNEE SURGERY Right    mensicus   2011   POLYPECTOMY     UPPER GI ENDOSCOPY  06/16/2005   Social History   Occupational History   Occupation: Nurse, learning disability ( Pharmacist, hospital)  Tobacco Use   Smoking status: Light Smoker    Types: Cigars   Smokeless tobacco: Never   Tobacco comments:    occasionally  every 3 months  Vaping Use   Vaping Use: Never used  Substance and Sexual Activity   Alcohol use: Yes    Alcohol/week: 1.0 standard drink of alcohol    Types: 1 Standard drinks or equivalent per week    Comment: rare   Drug use: No   Sexual activity: Yes

## 2022-05-18 NOTE — Progress Notes (Signed)
Doing a lot better  States that he does feel like shockwave is helping

## 2022-05-21 DIAGNOSIS — H3341 Traction detachment of retina, right eye: Secondary | ICD-10-CM | POA: Diagnosis not present

## 2022-05-21 DIAGNOSIS — H25811 Combined forms of age-related cataract, right eye: Secondary | ICD-10-CM | POA: Diagnosis not present

## 2022-05-21 DIAGNOSIS — T85398A Other mechanical complication of other ocular prosthetic devices, implants and grafts, initial encounter: Secondary | ICD-10-CM | POA: Diagnosis not present

## 2022-05-27 NOTE — Therapy (Deleted)
OUTPATIENT PHYSICAL THERAPY LOWER EXTREMITY EVALUATION   Patient Name: Antonio Reyes MRN: [id number] DOB:1945-05-18, 77 y.o., male Today's Date: 05/27/2022  END OF SESSION:   Past Medical History:  Diagnosis Date   Abdominal hernia    Allergic rhinitis    Anxiety    pt states no history of anxiety   Aortic atherosclerosis ([place]) 04/25/2018   noted on CT renal   Benign neoplasm of stomach    BPH (benign prostatic hypertrophy)    pt unaware   C6 cervical fracture (HCC)    C7,C8   Cancer ([place]) 2000   skin cancer   left ear   Chronic sinusitis    CKD (chronic kidney disease), stage III ([place])    pt unaware   Diverticulitis    Diverticulosis 03/27/2015   Moderate, Noted on colonoscopy   Dizziness    ED (erectile dysfunction)    GERD (gastroesophageal reflux disease)    History of herpes genitalis    History of kidney stones 04/25/2018   4 mm left UVJ stone, 10 mm bladder stone, noted on CT renal   HLD (hyperlipidemia)    HTN (hypertension)    Hydronephrosis    Left, Moderate, noted on CT renal   Inguinal hernia 04/25/2018   Bilateral, noted on CT renal, pt unaware   Low back pain    Migraine    hx of   Neck pain, bilateral    Obesity    Polycythemia 2000   per Dr. Elsworth Soho   Rotator cuff tear    Sleep apnea    CPAP   Tubular adenoma of colon 05/2008   Past Surgical History:  Procedure Laterality Date   COLONOSCOPY  03/27/2015   CYSTOSCOPY WITH RETROGRADE PYELOGRAM, URETEROSCOPY AND STENT PLACEMENT Left 05/31/2018   Procedure: CYSTOSCOPY WITH RETROGRADE PYELOGRAM, URETEROSCOPY AND STENT PLACEMENT;  Surgeon: Alexis Frock, MD;  Location[facility];  Service: Urology;  Laterality: Left;  1 HR   HERNIA REPAIR     umbilical with mesh   HOLMIUM LASER APPLICATION Left [id number]   Procedure: HOLMIUM LASER APPLICATION;  Surgeon: Alexis Frock, MD;  [facility];  Service: Urology;  Laterality: Left;   KNEE SURGERY Right    mensicus   2011   POLYPECTOMY     UPPER GI  ENDOSCOPY  06/16/2005   Patient Active Problem List   Diagnosis Date Noted   Bilateral primary osteoarthritis of knee 03/23/2022   Achilles tendinitis 03/23/2022   Bilateral leg pain 05/20/2021   History of colonic polyps 05/20/2021   Acute sinusitis 02/19/2021   Lower abdominal pain 12/06/2020   Elevated coronary artery calcium score 12/02/2020   Smoker 11/16/2020   Vitamin D deficiency 05/09/2020   Laceration of digital nerve of left thumb 01/31/2020   Bilateral posterior neck pain 05/09/2018   Microhematuria 04/24/2018   CKD (chronic kidney disease) stage 3, GFR 30-59 ml/min ([place]) 04/24/2018   Dysuria 04/21/2018   Allergic rhinitis 09/20/2017   Nonallopathic lesion of sacral region 08/04/2017   Nonallopathic lesion of lumbosacral region 08/04/2017   Nonallopathic lesion of thoracic region 08/04/2017   Bilateral foot pain 04/22/2017   Left leg pain 04/22/2017   Bilateral groin pain 04/22/2017   Metatarsalgia 11/11/2016   Hallux rigidus 06/11/2016   Contusion of left knee 06/11/2016   Hyperglycemia 04/21/2016   Chest pain 06/05/2015   Polycythemia, secondary 02/09/2013   Dizziness 06/08/2011   Chronic sinusitis 06/08/2011   Ankle pain 08/18/2010   Morbid obesity ([place]) 04/20/2010  ABRASION, ARM 04/20/2010   CHEST PAIN 07/22/2009   GENITAL HERPES 04/12/2008   ANXIETY 04/12/2008   MIGRAINE, COMMON 04/12/2008   BPH associated with nocturia 04/12/2008   COLONIC POLYPS, HX OF 04/12/2008   LOW BACK PAIN 11/26/2006   HYPERCHOLESTEROLEMIA 11/17/2006   ERECTILE DYSFUNCTION 11/17/2006   Essential hypertension 11/17/2006   GERD 11/17/2006   OSA (obstructive sleep apnea) 11/17/2006   DIVERTICULITIS, HX OF 11/17/2006    PCP: ***  REFERRING PROVIDER: ***  REFERRING DIAG: ***  THERAPY DIAG:  No diagnosis found.  Rationale for Evaluation and Treatment: {HABREHAB:27488}  ONSET DATE: ***  SUBJECTIVE:   SUBJECTIVE STATEMENT: ***  PERTINENT HISTORY: *** PAIN:  Are  you having pain? {OPRCPAIN:27236}  PRECAUTIONS: {Therapy precautions:24002}  WEIGHT BEARING RESTRICTIONS: {Yes ***/No:24003}  FALLS:  Has patient fallen in last 6 months? {fallsyesno:27318}  LIVING ENVIRONMENT: Lives with: {OPRC lives with:25569::"lives with their family"} Lives in: {Lives in:25570} Stairs: {opstairs:27293} Has following equipment at home: {Assistive devices:23999}  OCCUPATION: ***  PLOF: {PLOF:24004}  PATIENT GOALS: ***  NEXT MD VISIT: ***  OBJECTIVE:   DIAGNOSTIC FINDINGS: ***  PATIENT SURVEYS:  {rehab surveys:24030}  COGNITION: Overall cognitive status: {cognition:24006}     SENSATION: {sensation:27233}  EDEMA:  {edema:24020}  MUSCLE LENGTH: Hamstrings: Right *** deg; Left *** deg Thomas test: Right *** deg; Left *** deg  POSTURE: {posture:25561}  PALPATION: ***  LOWER EXTREMITY ROM:  {AROM/PROM:27142} ROM Right eval Left eval  Hip flexion    Hip extension    Hip abduction    Hip adduction    Hip internal rotation    Hip external rotation    Knee flexion    Knee extension    Ankle dorsiflexion    Ankle plantarflexion    Ankle inversion    Ankle eversion     (Blank rows = not tested)  LOWER EXTREMITY MMT:  MMT Right eval Left eval  Hip flexion    Hip extension    Hip abduction    Hip adduction    Hip internal rotation    Hip external rotation    Knee flexion    Knee extension    Ankle dorsiflexion    Ankle plantarflexion    Ankle inversion    Ankle eversion     (Blank rows = not tested)  LOWER EXTREMITY SPECIAL TESTS:  {LEspecialtests:26242}  FUNCTIONAL TESTS:  {Functional tests:24029}  GAIT: Distance walked: *** Assistive device utilized: {Assistive devices:23999} Level of assistance: {Levels of assistance:24026} Comments: ***   TODAY'S TREATMENT:                                                                                                                              DATE: ***    PATIENT  EDUCATION:  Education details: *** Person educated: {Person educated:25204} Education method: {Education Method:25205} Education comprehension: {Education Comprehension:25206}  HOME EXERCISE PROGRAM: ***  ASSESSMENT:  CLINICAL IMPRESSION: Patient is a *** y.o. *** who was seen today for  physical therapy evaluation and treatment for ***.   OBJECTIVE IMPAIRMENTS: {opptimpairments:25111}.   ACTIVITY LIMITATIONS: {activitylimitations:27494}  PARTICIPATION LIMITATIONS: {participationrestrictions:25113}  PERSONAL FACTORS: {Personal factors:25162} are also affecting patient's functional outcome.   REHAB POTENTIAL: {rehabpotential:25112}  CLINICAL DECISION MAKING: {clinical decision making:25114}  EVALUATION COMPLEXITY: {Evaluation complexity:25115}   GOALS: Goals reviewed with patient? {yes/no:20286}  SHORT TERM GOALS: Target date: *** *** Baseline: Goal status: {GOALSTATUS:25110}  2.  *** Baseline:  Goal status: {GOALSTATUS:25110}  3.  *** Baseline:  Goal status: {GOALSTATUS:25110}  4.  *** Baseline:  Goal status: {GOALSTATUS:25110}  5.  *** Baseline:  Goal status: {GOALSTATUS:25110}  6.  *** Baseline:  Goal status: {GOALSTATUS:25110}  LONG TERM GOALS: Target date: ***  *** Baseline:  Goal status: {GOALSTATUS:25110}  2.  *** Baseline:  Goal status: {GOALSTATUS:25110}  3.  *** Baseline:  Goal status: {GOALSTATUS:25110}  4.  *** Baseline:  Goal status: {GOALSTATUS:25110}  5.  *** Baseline:  Goal status: {GOALSTATUS:25110}  6.  *** Baseline:  Goal status: {GOALSTATUS:25110}   PLAN:  PT FREQUENCY: {rehab frequency:25116}  PT DURATION: {rehab duration:25117}  PLANNED INTERVENTIONS: {rehab planned interventions:25118::"Therapeutic exercises","Therapeutic activity","Neuromuscular re-education","Balance training","Gait training","Patient/Family education","Self Care","Joint mobilization"}  PLAN FOR NEXT SESSION: ***   Amaiyah Nordhoff,  PT 05/27/2022, 10:02 AM

## 2022-05-28 ENCOUNTER — Ambulatory Visit: Payer: Medicare Other | Attending: Sports Medicine | Admitting: Physical Therapy

## 2022-05-28 DIAGNOSIS — H59811 Chorioretinal scars after surgery for detachment, right eye: Secondary | ICD-10-CM | POA: Diagnosis not present

## 2022-05-28 DIAGNOSIS — H3521 Other non-diabetic proliferative retinopathy, right eye: Secondary | ICD-10-CM | POA: Diagnosis not present

## 2022-06-01 ENCOUNTER — Encounter: Payer: Self-pay | Admitting: Sports Medicine

## 2022-06-01 ENCOUNTER — Ambulatory Visit (INDEPENDENT_AMBULATORY_CARE_PROVIDER_SITE_OTHER): Payer: Medicare Other | Admitting: Sports Medicine

## 2022-06-01 ENCOUNTER — Other Ambulatory Visit: Payer: Self-pay | Admitting: Internal Medicine

## 2022-06-01 DIAGNOSIS — M25561 Pain in right knee: Secondary | ICD-10-CM | POA: Diagnosis not present

## 2022-06-01 DIAGNOSIS — M25562 Pain in left knee: Secondary | ICD-10-CM | POA: Diagnosis not present

## 2022-06-01 DIAGNOSIS — R29898 Other symptoms and signs involving the musculoskeletal system: Secondary | ICD-10-CM | POA: Diagnosis not present

## 2022-06-01 DIAGNOSIS — M7661 Achilles tendinitis, right leg: Secondary | ICD-10-CM

## 2022-06-01 DIAGNOSIS — G8929 Other chronic pain: Secondary | ICD-10-CM

## 2022-06-01 NOTE — Progress Notes (Signed)
Doing good overall; feels like these treatments are helping  The only pain he has had recently was yesterday; but thinks it is because he knew he had this appointment today

## 2022-06-01 NOTE — Progress Notes (Signed)
[name] - 77 y.o. male MRN [id number]  Date of birth: 07-30-1945  Office Visit Note: Visit Date: 06/01/2022 PCP: [name], MD Referred by: [name], MD  Subjective: Chief Complaint  Patient presents with   Right Leg - Follow-up   HPI: Antonio Reyes is a pleasant 77 y.o. male who presents today for right chronic heel pain and insertional achilles tendinopathy.  [name] states that he has found great improvement from the shockwave treatment, his home exercises/stretching, as well as the heel lifts.  Feels like he essentially has no pain at this point.  He is still having some tightness in the hips and the knees.  He does have osteoarthritis of the right knee.  He unfortunately reschedule his appointment with [name] for PT (pilates-based PT), but has not heard back from scheduling team. Asking for a new referral.  ECSWT #5 today.  Pertinent ROS were reviewed with the patient and found to be negative unless otherwise specified above in HPI.   Assessment & Plan: Visit Diagnoses:  1. Achilles tendinitis of right lower extremity   2. Hip tightness   3. Chronic pain of both knees    Plan: Discussed with Eddison today that he essentially has had resolution of his Achilles tendinopathy, we did perform his last extracorporeal shockwave treatment therapy today.  He will work on transitioning out of the heel lifts as able.  I would like him to continue his home exercises and stretching for both Achilles.  I did place a new referral for [name] for his pilates-based/Tai Chi PT for his bilateral hip tightness and knee OA to work on increase mobility and flexibility. He will follow-up with me as needed.  Follow-up: Return if symptoms worsen or fail to improve.   Meds & Orders: No orders of the defined types were placed in this encounter.   Orders Placed This Encounter  Procedures   Ambulatory referral to Physical Therapy     Procedures: Procedure: ECSWT Indications:   Heel spur, achilles tendinopathy   Procedure Details Consent: Risks of procedure as well as the alternatives and risks of each were explained to the patient.  Verbal consent for procedure obtained. Time Out: Verified patient identification, verified procedure, site was marked, verified correct patient position. The area was cleaned with alcohol swab.     The right calcaneus and achilles tendon was targeted for Extracorporeal shockwave therapy.    Preset: Heel spur/tendinopathy Power Level: 100 mJ Frequency: 11 Hz  Impulse/cycles: 2500 Head size: Regular   Patient tolerated procedure well without immediate complications.       Clinical History: No specialty comments available.  He reports that he has been smoking cigars. He has never used smokeless tobacco. No results for input(s): "HGBA1C", "LABURIC" in the last 8760 hours.  Objective:     Physical Exam  Gen: Well-appearing, in no acute distress; non-toxic CV: Well-perfused. Warm.  Resp: Breathing unlabored on room air; no wheezing. Psych: Fluid speech in conversation; appropriate affect; normal thought process Neuro: Sensation intact throughout. No gross coordination deficits.   Ortho Exam - Right heel/achilles: No true TTP noted over the medial superior aspect of the posterior calcaneus.  No TTP of the Achilles tendon.  There is full plantarflexion with improved dorsiflexion to about 105 degrees.  No pain upon palpation of the plantar aspect of the heel or plantar fascia.  No overlying swelling or erythema.   Imaging: No results found.  Past Medical/Family/Surgical/Social History: Medications &  Allergies reviewed per EMR, new medications updated. Patient Active Problem List   Diagnosis Date Noted   Bilateral primary osteoarthritis of knee 03/23/2022   Achilles tendinitis 03/23/2022   Bilateral leg pain 05/20/2021   History of colonic polyps 05/20/2021   Acute sinusitis 02/19/2021   Lower abdominal pain 12/06/2020    Elevated coronary artery calcium score 12/02/2020   Smoker 11/16/2020   Vitamin D deficiency 05/09/2020   Laceration of digital nerve of left thumb 01/31/2020   Bilateral posterior neck pain 05/09/2018   Microhematuria 04/24/2018   CKD (chronic kidney disease) stage 3, GFR 30-59 ml/min (HCC) 04/24/2018   Dysuria 04/21/2018   Allergic rhinitis 09/20/2017   Nonallopathic lesion of sacral region 08/04/2017   Nonallopathic lesion of lumbosacral region 08/04/2017   Nonallopathic lesion of thoracic region 08/04/2017   Bilateral foot pain 04/22/2017   Left leg pain 04/22/2017   Bilateral groin pain 04/22/2017   Metatarsalgia 11/11/2016   Hallux rigidus 06/11/2016   Contusion of left knee 06/11/2016   Hyperglycemia 04/21/2016   Chest pain 06/05/2015   Polycythemia, secondary 02/09/2013   Dizziness 06/08/2011   Chronic sinusitis 06/08/2011   Ankle pain 08/18/2010   Morbid obesity ([place]) 04/20/2010   ABRASION, ARM 04/20/2010   CHEST PAIN 07/22/2009   GENITAL HERPES 04/12/2008   ANXIETY 04/12/2008   MIGRAINE, COMMON 04/12/2008   BPH associated with nocturia 04/12/2008   COLONIC POLYPS, HX OF 04/12/2008   LOW BACK PAIN 11/26/2006   HYPERCHOLESTEROLEMIA 11/17/2006   ERECTILE DYSFUNCTION 11/17/2006   Essential hypertension 11/17/2006   GERD 11/17/2006   OSA (obstructive sleep apnea) 11/17/2006   DIVERTICULITIS, HX OF 11/17/2006   Past Medical History:  Diagnosis Date   Abdominal hernia    Allergic rhinitis    Anxiety    pt states no history of anxiety   Aortic atherosclerosis ([place]) 04/25/2018   noted on CT renal   Benign neoplasm of stomach    BPH (benign prostatic hypertrophy)    pt unaware   C6 cervical fracture ([place])    C7,C8   Cancer ([place]) 2000   skin cancer   left ear   Chronic sinusitis    CKD (chronic kidney disease), stage III ([place])    pt unaware   Diverticulitis    Diverticulosis 03/27/2015   Moderate, Noted on colonoscopy   Dizziness    ED (erectile  dysfunction)    GERD (gastroesophageal reflux disease)    History of herpes genitalis    History of kidney stones 04/25/2018   4 mm left UVJ stone, 10 mm bladder stone, noted on CT renal   HLD (hyperlipidemia)    HTN (hypertension)    Hydronephrosis    Left, Moderate, noted on CT renal   Inguinal hernia 04/25/2018   Bilateral, noted on CT renal, pt unaware   Low back pain    Migraine    hx of   Neck pain, bilateral    Obesity    Polycythemia 2000   per Dr. Elsworth Soho   Rotator cuff tear    Sleep apnea    CPAP   Tubular adenoma of colon 05/2008   Family History  Problem Relation Age of Onset   Colon cancer Mother 25   Pancreatic cancer Paternal Grandfather    Colon polyps Neg Hx    Esophageal cancer Neg Hx    Stomach cancer Neg Hx    Rectal cancer Neg Hx    Past Surgical History:  Procedure Laterality Date   COLONOSCOPY  03/27/2015  CYSTOSCOPY WITH RETROGRADE PYELOGRAM, URETEROSCOPY AND STENT PLACEMENT Left 05/31/2018   Procedure: CYSTOSCOPY WITH RETROGRADE PYELOGRAM, URETEROSCOPY AND STENT PLACEMENT;  Surgeon: Alexis Frock, MD;  Location: [facility];  Service: Urology;  Laterality: Left;  1 HR   HERNIA REPAIR     umbilical with mesh   HOLMIUM LASER APPLICATION Left [id number]   Procedure: HOLMIUM LASER APPLICATION;  Surgeon: Alexis Frock, MD;  Location: [facility];  Service: Urology;  Laterality: Left;   KNEE SURGERY Right    mensicus   2011   POLYPECTOMY     UPPER GI ENDOSCOPY  06/16/2005   Social History   Occupational History   Occupation: Nurse, learning disability ( Pharmacist, hospital)  Tobacco Use   Smoking status: Light Smoker    Types: Cigars   Smokeless tobacco: Never   Tobacco comments:    occasionally  every 3 months  Vaping Use   Vaping Use: Never used  Substance and Sexual Activity   Alcohol use: Yes    Alcohol/week: 1.0 standard drink of alcohol    Types: 1 Standard drinks or equivalent per week    Comment: rare   Drug use: No   Sexual activity: Yes

## 2022-06-09 ENCOUNTER — Encounter: Payer: Self-pay | Admitting: Sports Medicine

## 2022-06-11 DIAGNOSIS — H35351 Cystoid macular degeneration, right eye: Secondary | ICD-10-CM | POA: Diagnosis not present

## 2022-06-11 DIAGNOSIS — H59812 Chorioretinal scars after surgery for detachment, left eye: Secondary | ICD-10-CM | POA: Diagnosis not present

## 2022-06-11 DIAGNOSIS — T85398D Other mechanical complication of other ocular prosthetic devices, implants and grafts, subsequent encounter: Secondary | ICD-10-CM | POA: Diagnosis not present

## 2022-06-11 DIAGNOSIS — H3521 Other non-diabetic proliferative retinopathy, right eye: Secondary | ICD-10-CM | POA: Diagnosis not present

## 2022-06-11 DIAGNOSIS — H30041 Focal chorioretinal inflammation, macular or paramacular, right eye: Secondary | ICD-10-CM | POA: Diagnosis not present

## 2022-06-11 DIAGNOSIS — Z9889 Other specified postprocedural states: Secondary | ICD-10-CM | POA: Diagnosis not present

## 2022-06-11 DIAGNOSIS — H59811 Chorioretinal scars after surgery for detachment, right eye: Secondary | ICD-10-CM | POA: Diagnosis not present

## 2022-06-29 ENCOUNTER — Ambulatory Visit (INDEPENDENT_AMBULATORY_CARE_PROVIDER_SITE_OTHER): Payer: Medicare Other | Admitting: Internal Medicine

## 2022-06-29 ENCOUNTER — Encounter: Payer: Self-pay | Admitting: Internal Medicine

## 2022-06-29 VITALS — BP 124/68 | HR 62 | Temp 98.3°F | Ht 76.0 in | Wt 297.0 lb

## 2022-06-29 DIAGNOSIS — F17200 Nicotine dependence, unspecified, uncomplicated: Secondary | ICD-10-CM | POA: Diagnosis not present

## 2022-06-29 DIAGNOSIS — E559 Vitamin D deficiency, unspecified: Secondary | ICD-10-CM | POA: Diagnosis not present

## 2022-06-29 DIAGNOSIS — N1831 Chronic kidney disease, stage 3a: Secondary | ICD-10-CM | POA: Diagnosis not present

## 2022-06-29 DIAGNOSIS — J0191 Acute recurrent sinusitis, unspecified: Secondary | ICD-10-CM | POA: Diagnosis not present

## 2022-06-29 DIAGNOSIS — E7800 Pure hypercholesterolemia, unspecified: Secondary | ICD-10-CM | POA: Diagnosis not present

## 2022-06-29 DIAGNOSIS — R739 Hyperglycemia, unspecified: Secondary | ICD-10-CM | POA: Diagnosis not present

## 2022-06-29 DIAGNOSIS — E538 Deficiency of other specified B group vitamins: Secondary | ICD-10-CM

## 2022-06-29 DIAGNOSIS — N320 Bladder-neck obstruction: Secondary | ICD-10-CM | POA: Diagnosis not present

## 2022-06-29 MED ORDER — AZITHROMYCIN 250 MG PO TABS: ORAL_TABLET | ORAL | 1 refills | Status: AC

## 2022-06-29 NOTE — Assessment & Plan Note (Signed)
Last vitamin D Lab Results  Component Value Date   VD25OH 19.53 (L) 05/20/2021   Lo, to start oral replacement

## 2022-06-29 NOTE — Patient Instructions (Signed)
Please take all new medication as prescribed - the antibiotic  Please continue all other medications as before, and refills have been done if requested.  Please have the pharmacy call with any other refills you may need.  Please keep your appointments with your specialists as you may have planned  Please go to the LAB at the blood drawing area for the tests to be done  - at the [facility] lab at your convenience  You will be contacted by phone if any changes need to be made immediately.  Otherwise, you will receive a letter about your results with an explanation, but please check with MyChart first.

## 2022-06-29 NOTE — Assessment & Plan Note (Signed)
Lab Results  Component Value Date   CREATININE 1.23 01/05/2022   Stable overall, cont to avoid nephrotoxins

## 2022-06-29 NOTE — Assessment & Plan Note (Signed)
Pt counsled to quit, pt not ready °

## 2022-06-29 NOTE — Assessment & Plan Note (Signed)
Lab Results  Component Value Date   HGBA1C 6.0 05/20/2021   Stable, pt to continue current medical treatment  - diet, wt control

## 2022-06-29 NOTE — Progress Notes (Signed)
Patient ID: Antonio Reyes, male   DOB: 03-03-1946, 77 y.o.   MRN: [id number]        Chief Complaint: follow up sinusitis symptoms,  ckd3a, hld, hyperglycemia, smoker       HPI:  Antonio Reyes is a 77 y.o. male  Here with 2-3 days acute onset fever, facial pain, pressure, headache, general weakness and malaise, and greenish d/c, with mild ST and cough, but pt denies chest pain, wheezing, increased sob or doe, orthopnea, PND, increased LE swelling, palpitations, dizziness or syncope.   Pt denies polydipsia, polyuria, or new focal neuro s/s.    Pt denies recent wt loss, night sweats, loss of appetite, or other constitutional symptoms         Wt Readings from Last 3 Encounters:  06/29/22 297 lb (134.7 kg)  12/29/21 (!) 301 lb (136.5 kg)  08/05/21 293 lb (132.9 kg)   BP Readings from Last 3 Encounters:  06/29/22 124/68  12/29/21 126/82  08/05/21 134/84         Past Medical History:  Diagnosis Date   Abdominal hernia    Allergic rhinitis    Anxiety    pt states no history of anxiety   Aortic atherosclerosis 04/25/2018   noted on CT renal   Benign neoplasm of stomach    BPH (benign prostatic hypertrophy)    pt unaware   C6 cervical fracture    C7,C8   Cancer 2000   skin cancer   left ear   Chronic sinusitis    CKD (chronic kidney disease), stage III    pt unaware   Diverticulitis    Diverticulosis 03/27/2015   Moderate, Noted on colonoscopy   Dizziness    ED (erectile dysfunction)    GERD (gastroesophageal reflux disease)    History of herpes genitalis    History of kidney stones 04/25/2018   4 mm left UVJ stone, 10 mm bladder stone, noted on CT renal   HLD (hyperlipidemia)    HTN (hypertension)    Hydronephrosis    Left, Moderate, noted on CT renal   Inguinal hernia 04/25/2018   Bilateral, noted on CT renal, pt unaware   Low back pain    Migraine    hx of   Neck pain, bilateral    Obesity    Polycythemia 2000   per Dr. Vassie LollAlva   Rotator cuff tear    Sleep apnea     CPAP   Tubular adenoma of colon 05/2008   Past Surgical History:  Procedure Laterality Date   COLONOSCOPY  03/27/2015   CYSTOSCOPY WITH RETROGRADE PYELOGRAM, URETEROSCOPY AND STENT PLACEMENT Left 05/31/2018   Procedure: CYSTOSCOPY WITH RETROGRADE PYELOGRAM, URETEROSCOPY AND STENT PLACEMENT;  Surgeon: Sebastian AcheManny, Theodore, MD;  Location: [facility];  Service: Urology;  Laterality: Left;  1 HR   HERNIA REPAIR     umbilical with mesh   HOLMIUM LASER APPLICATION Left 05/31/2018   Procedure: HOLMIUM LASER APPLICATION;  Surgeon: Sebastian AcheManny, Theodore, MD;  Location: [facility];  Service: Urology;  Laterality: Left;   KNEE SURGERY Right    mensicus   2011   POLYPECTOMY     UPPER GI ENDOSCOPY  06/16/2005    reports that he has been smoking cigars. He has never used smokeless tobacco. He reports current alcohol use of about 1.0 standard drink of alcohol per week. He reports that he does not use drugs. family history includes Colon cancer (age of onset: 6468) in his mother; Pancreatic cancer in his paternal grandfather.  Allergies  Allergen Reactions   Olive Tree Other (See Comments)   Pollen Extract     Other reaction(s): Other (See Comments)   Current Outpatient Medications on File Prior to Visit  Medication Sig Dispense Refill   aspirin 81 MG tablet Take 81 mg by mouth daily.     atorvastatin (LIPITOR) 40 MG tablet TAKE 1 TABLET EVERY DAY 90 tablet 3   Difluprednate 0.05 % EMUL Place 1 drop into the right eye 4 (four) times daily.     fluticasone (FLONASE) 50 MCG/ACT nasal spray USE 2 SPRAYS IN EACH NOSTRIL EVERY DAY (SUBSTITUTED FOR  FLONASE) 48 g 0   ibuprofen (ADVIL,MOTRIN) 200 MG tablet Take 200 mg by mouth every 6 (six) hours as needed for headache or moderate pain.     metoprolol succinate (TOPROL-XL) 50 MG 24 hr tablet TAKE 1 TABLET EVERY DAY 90 tablet 3   montelukast (SINGULAIR) 10 MG tablet TAKE 1 TABLET EVERY DAY 90 tablet 3   omeprazole (PRILOSEC) 20 MG capsule Take 1 capsule (20 mg total) by mouth  daily. 30 capsule 11   prednisoLONE acetate (PRED FORTE) 1 % ophthalmic suspension SMARTSIG:In Eye(s)     Current Facility-Administered Medications on File Prior to Visit  Medication Dose Route Frequency Provider Last Rate Last Admin   gentamicin (GARAMYCIN) 500 mg in dextrose 5 % 100 mL IVPB  500 mg Intravenous Once Sebastian Ache, MD            ROS:  All others reviewed and negative.  Objective        PE:  BP 124/68   Pulse 62   Temp 98.3 F (36.8 C) (Oral)   Ht 6\' 4"  (1.93 m)   Wt 297 lb (134.7 kg)   SpO2 92%   BMI 36.15 kg/m                 Constitutional: Pt appears in NAD               HENT: Head: NCAT.                Right Ear: External ear normal.                 Left Ear: External ear normal. Bilat tm's with mild erythema.  Max sinus areas mild tender.  Pharynx with mild erythema, no exudate                 Eyes: . Pupils are equal, round, and reactive to light. Conjunctivae and EOM are normal               Nose: without d/c or deformity               Neck: Neck supple. Gross normal ROM               Cardiovascular: Normal rate and regular rhythm.                 Pulmonary/Chest: Effort normal and breath sounds without rales or wheezing.                               Neurological: Pt is alert. At baseline orientation, motor grossly intact               Skin: Skin is warm. No rashes, no other new lesions, LE edema - none  Psychiatric: Pt behavior is normal without agitation   Micro: none  Cardiac tracings I have personally interpreted today:  none  Pertinent Radiological findings (summarize): none   Lab Results  Component Value Date   WBC 8.1 12/29/2021   HGB 17.4 (H) 12/29/2021   HCT 51.0 12/29/2021   PLT 219.0 12/29/2021   GLUCOSE 91 01/05/2022   CHOL 114 05/20/2021   TRIG 92.0 05/20/2021   HDL 40.20 05/20/2021   LDLDIRECT 148.7 04/12/2008   LDLCALC 56 05/20/2021   ALT 36 12/29/2021   AST 26 12/29/2021   NA 136 01/05/2022   K 4.2  01/05/2022   CL 103 01/05/2022   CREATININE 1.23 01/05/2022   BUN 21 01/05/2022   CO2 25 01/05/2022   TSH 1.51 05/20/2021   PSA 0.74 05/20/2021   HGBA1C 6.0 05/20/2021   Assessment/Plan:  Antonio Reyes is a 78 y.o. White or Caucasian [1] male with  has a past medical history of Abdominal hernia, Allergic rhinitis, Anxiety, Aortic atherosclerosis (04/25/2018), Benign neoplasm of stomach, BPH (benign prostatic hypertrophy), C6 cervical fracture, Cancer (2000), Chronic sinusitis, CKD (chronic kidney disease), stage III, Diverticulitis, Diverticulosis (03/27/2015), Dizziness, ED (erectile dysfunction), GERD (gastroesophageal reflux disease), History of herpes genitalis, History of kidney stones (04/25/2018), HLD (hyperlipidemia), HTN (hypertension), Hydronephrosis, Inguinal hernia (04/25/2018), Low back pain, Migraine, Neck pain, bilateral, Obesity, Polycythemia (2000), Rotator cuff tear, Sleep apnea, and Tubular adenoma of colon (05/2008).  Acute sinusitis Mild to mod, for antibx course doxycycline 100 bid,  to f/u any worsening symptoms or concerns   CKD (chronic kidney disease) stage 3, GFR 30-59 ml/min (HCC) Lab Results  Component Value Date   CREATININE 1.23 01/05/2022   Stable overall, cont to avoid nephrotoxins   HYPERCHOLESTEROLEMIA Lab Results  Component Value Date   LDLCALC 56 05/20/2021   Stable, pt to continue current statin lipitor 40 qd   Hyperglycemia Lab Results  Component Value Date   HGBA1C 6.0 05/20/2021   Stable, pt to continue current medical treatment  - diet, wt control   Smoker Pt counsled to quit, pt not ready  Vitamin D deficiency Last vitamin D Lab Results  Component Value Date   VD25OH 19.53 (L) 05/20/2021   Lo, to start oral replacement  Followup: Return in about 6 months (around 12/29/2022).  Oliver Barre, MD 06/29/2022 8:48 PM [facility]

## 2022-06-29 NOTE — Assessment & Plan Note (Signed)
Mild to mod, for antibx course doxycycline 100 bid,  to f/u any worsening symptoms or concerns 

## 2022-06-29 NOTE — Assessment & Plan Note (Signed)
Lab Results  Component Value Date   LDLCALC 56 05/20/2021   Stable, pt to continue current statin lipitor 40 qd

## 2022-06-30 ENCOUNTER — Ambulatory Visit: Payer: Medicare Other | Admitting: Internal Medicine

## 2022-07-01 ENCOUNTER — Other Ambulatory Visit (INDEPENDENT_AMBULATORY_CARE_PROVIDER_SITE_OTHER): Payer: Medicare Other

## 2022-07-01 DIAGNOSIS — E7800 Pure hypercholesterolemia, unspecified: Secondary | ICD-10-CM | POA: Diagnosis not present

## 2022-07-01 DIAGNOSIS — E559 Vitamin D deficiency, unspecified: Secondary | ICD-10-CM

## 2022-07-01 DIAGNOSIS — E538 Deficiency of other specified B group vitamins: Secondary | ICD-10-CM

## 2022-07-01 DIAGNOSIS — R739 Hyperglycemia, unspecified: Secondary | ICD-10-CM

## 2022-07-01 DIAGNOSIS — N320 Bladder-neck obstruction: Secondary | ICD-10-CM

## 2022-07-01 LAB — CBC WITH DIFFERENTIAL/PLATELET
Basophils Absolute: 0.1 10*3/uL (ref 0.0–0.1)
Basophils Relative: 0.7 % (ref 0.0–3.0)
Eosinophils Absolute: 0.2 10*3/uL (ref 0.0–0.7)
Eosinophils Relative: 2.7 % (ref 0.0–5.0)
HCT: 50.2 % (ref 39.0–52.0)
Hemoglobin: 17 g/dL (ref 13.0–17.0)
Lymphocytes Relative: 22.8 % (ref 12.0–46.0)
Lymphs Abs: 2 10*3/uL (ref 0.7–4.0)
MCHC: 33.8 g/dL (ref 30.0–36.0)
MCV: 91.1 fl (ref 78.0–100.0)
Monocytes Absolute: 0.6 10*3/uL (ref 0.1–1.0)
Monocytes Relative: 6.5 % (ref 3.0–12.0)
Neutro Abs: 6 10*3/uL (ref 1.4–7.7)
Neutrophils Relative %: 67.3 % (ref 43.0–77.0)
Platelets: 223 10*3/uL (ref 150.0–400.0)
RBC: 5.5 Mil/uL (ref 4.22–5.81)
RDW: 13.4 % (ref 11.5–15.5)
WBC: 9 10*3/uL (ref 4.0–10.5)

## 2022-07-01 LAB — URINALYSIS, ROUTINE W REFLEX MICROSCOPIC
Bilirubin Urine: NEGATIVE
Hgb urine dipstick: NEGATIVE
Ketones, ur: NEGATIVE
Leukocytes,Ua: NEGATIVE
Nitrite: NEGATIVE
RBC / HPF: NONE SEEN (ref 0–?)
Specific Gravity, Urine: 1.025 (ref 1.000–1.030)
Total Protein, Urine: NEGATIVE
Urine Glucose: NEGATIVE
Urobilinogen, UA: 0.2 (ref 0.0–1.0)
WBC, UA: NONE SEEN (ref 0–?)
pH: 5.5 (ref 5.0–8.0)

## 2022-07-01 LAB — HEPATIC FUNCTION PANEL
ALT: 28 U/L (ref 0–53)
AST: 19 U/L (ref 0–37)
Albumin: 4.2 g/dL (ref 3.5–5.2)
Alkaline Phosphatase: 108 U/L (ref 39–117)
Bilirubin, Direct: 0.1 mg/dL (ref 0.0–0.3)
Total Bilirubin: 0.6 mg/dL (ref 0.2–1.2)
Total Protein: 6.4 g/dL (ref 6.0–8.3)

## 2022-07-01 LAB — LIPID PANEL
Cholesterol: 114 mg/dL (ref 0–200)
HDL: 35.4 mg/dL — ABNORMAL LOW (ref >39.00)
LDL Cholesterol: 56 mg/dL (ref 0–99)
NonHDL: 78.69
Total CHOL/HDL Ratio: 3
Triglycerides: 111 mg/dL (ref 0.0–149.0)
VLDL: 22.2 mg/dL (ref 0.0–40.0)

## 2022-07-01 LAB — MICROALBUMIN / CREATININE URINE RATIO
Creatinine,U: 119.2 mg/dL
Microalb Creat Ratio: 0.8 mg/g (ref 0.0–30.0)
Microalb, Ur: 0.9 mg/dL (ref 0.0–1.9)

## 2022-07-01 LAB — VITAMIN D 25 HYDROXY (VIT D DEFICIENCY, FRACTURES): VITD: 25.5 ng/mL — ABNORMAL LOW (ref 30.00–100.00)

## 2022-07-01 LAB — BASIC METABOLIC PANEL
BUN: 17 mg/dL (ref 6–23)
CO2: 23 mEq/L (ref 19–32)
Calcium: 9.2 mg/dL (ref 8.4–10.5)
Chloride: 105 mEq/L (ref 96–112)
Creatinine, Ser: 1.3 mg/dL (ref 0.40–1.50)
GFR: 54 mL/min — ABNORMAL LOW (ref >60.00)
Glucose, Bld: 118 mg/dL — ABNORMAL HIGH (ref 70–99)
Potassium: 4.3 mEq/L (ref 3.5–5.1)
Sodium: 139 mEq/L (ref 135–145)

## 2022-07-01 LAB — TSH: TSH: 2.21 u[IU]/mL (ref 0.35–5.50)

## 2022-07-01 LAB — PSA: PSA: 0.97 ng/mL (ref 0.10–4.00)

## 2022-07-01 LAB — HEMOGLOBIN A1C: Hgb A1c MFr Bld: 6.1 % (ref 4.6–6.5)

## 2022-07-01 LAB — VITAMIN B12: Vitamin B-12: 277 pg/mL (ref 211–911)

## 2022-07-01 NOTE — Progress Notes (Signed)
The test results show that your current treatment is OK, as the tests are stable.  Please continue the same plan.  There is no other need for change of treatment or further evaluation based on these results, at this time.  thanks 

## 2022-07-08 DIAGNOSIS — H35351 Cystoid macular degeneration, right eye: Secondary | ICD-10-CM | POA: Diagnosis not present

## 2022-07-08 DIAGNOSIS — H30041 Focal chorioretinal inflammation, macular or paramacular, right eye: Secondary | ICD-10-CM | POA: Diagnosis not present

## 2022-07-08 DIAGNOSIS — H33011 Retinal detachment with single break, right eye: Secondary | ICD-10-CM | POA: Diagnosis not present

## 2022-07-08 DIAGNOSIS — Z9889 Other specified postprocedural states: Secondary | ICD-10-CM | POA: Diagnosis not present

## 2022-07-08 DIAGNOSIS — H3521 Other non-diabetic proliferative retinopathy, right eye: Secondary | ICD-10-CM | POA: Diagnosis not present

## 2022-07-08 DIAGNOSIS — H59811 Chorioretinal scars after surgery for detachment, right eye: Secondary | ICD-10-CM | POA: Diagnosis not present

## 2022-07-08 DIAGNOSIS — H59812 Chorioretinal scars after surgery for detachment, left eye: Secondary | ICD-10-CM | POA: Diagnosis not present

## 2022-07-08 DIAGNOSIS — T85398D Other mechanical complication of other ocular prosthetic devices, implants and grafts, subsequent encounter: Secondary | ICD-10-CM | POA: Diagnosis not present

## 2022-07-20 ENCOUNTER — Telehealth: Payer: Self-pay | Admitting: Physical Therapy

## 2022-07-20 NOTE — Telephone Encounter (Signed)
LVM to reschedule Eval

## 2022-08-25 DIAGNOSIS — H35351 Cystoid macular degeneration, right eye: Secondary | ICD-10-CM | POA: Diagnosis not present

## 2022-08-25 DIAGNOSIS — H30041 Focal chorioretinal inflammation, macular or paramacular, right eye: Secondary | ICD-10-CM | POA: Diagnosis not present

## 2022-08-25 DIAGNOSIS — H59813 Chorioretinal scars after surgery for detachment, bilateral: Secondary | ICD-10-CM | POA: Diagnosis not present

## 2022-09-24 DIAGNOSIS — T85398A Other mechanical complication of other ocular prosthetic devices, implants and grafts, initial encounter: Secondary | ICD-10-CM | POA: Diagnosis not present

## 2022-09-24 DIAGNOSIS — H35351 Cystoid macular degeneration, right eye: Secondary | ICD-10-CM | POA: Diagnosis not present

## 2022-09-24 DIAGNOSIS — H268 Other specified cataract: Secondary | ICD-10-CM | POA: Diagnosis not present

## 2022-09-24 DIAGNOSIS — H26491 Other secondary cataract, right eye: Secondary | ICD-10-CM | POA: Diagnosis not present

## 2022-09-28 DIAGNOSIS — D225 Melanocytic nevi of trunk: Secondary | ICD-10-CM | POA: Diagnosis not present

## 2022-09-28 DIAGNOSIS — L728 Other follicular cysts of the skin and subcutaneous tissue: Secondary | ICD-10-CM | POA: Diagnosis not present

## 2022-09-28 DIAGNOSIS — L821 Other seborrheic keratosis: Secondary | ICD-10-CM | POA: Diagnosis not present

## 2022-09-28 DIAGNOSIS — L570 Actinic keratosis: Secondary | ICD-10-CM | POA: Diagnosis not present

## 2022-09-28 DIAGNOSIS — D492 Neoplasm of unspecified behavior of bone, soft tissue, and skin: Secondary | ICD-10-CM | POA: Diagnosis not present

## 2022-09-28 DIAGNOSIS — L814 Other melanin hyperpigmentation: Secondary | ICD-10-CM | POA: Diagnosis not present

## 2022-10-01 DIAGNOSIS — H35051 Retinal neovascularization, unspecified, right eye: Secondary | ICD-10-CM | POA: Diagnosis not present

## 2022-10-01 DIAGNOSIS — Z9889 Other specified postprocedural states: Secondary | ICD-10-CM | POA: Diagnosis not present

## 2022-10-01 DIAGNOSIS — H31093 Other chorioretinal scars, bilateral: Secondary | ICD-10-CM | POA: Diagnosis not present

## 2022-10-21 DIAGNOSIS — H30021 Focal chorioretinal inflammation of posterior pole, right eye: Secondary | ICD-10-CM | POA: Diagnosis not present

## 2022-10-21 DIAGNOSIS — T85398A Other mechanical complication of other ocular prosthetic devices, implants and grafts, initial encounter: Secondary | ICD-10-CM | POA: Diagnosis not present

## 2022-10-21 DIAGNOSIS — H31093 Other chorioretinal scars, bilateral: Secondary | ICD-10-CM | POA: Diagnosis not present

## 2022-10-21 DIAGNOSIS — H35051 Retinal neovascularization, unspecified, right eye: Secondary | ICD-10-CM | POA: Diagnosis not present

## 2022-10-21 DIAGNOSIS — H26491 Other secondary cataract, right eye: Secondary | ICD-10-CM | POA: Diagnosis not present

## 2022-10-21 DIAGNOSIS — Z9889 Other specified postprocedural states: Secondary | ICD-10-CM | POA: Diagnosis not present

## 2022-10-21 DIAGNOSIS — H35351 Cystoid macular degeneration, right eye: Secondary | ICD-10-CM | POA: Diagnosis not present

## 2022-11-04 DIAGNOSIS — H3341 Traction detachment of retina, right eye: Secondary | ICD-10-CM | POA: Diagnosis not present

## 2022-11-04 DIAGNOSIS — H3521 Other non-diabetic proliferative retinopathy, right eye: Secondary | ICD-10-CM | POA: Diagnosis not present

## 2022-11-08 DIAGNOSIS — H3521 Other non-diabetic proliferative retinopathy, right eye: Secondary | ICD-10-CM | POA: Diagnosis not present

## 2022-11-08 DIAGNOSIS — H33021 Retinal detachment with multiple breaks, right eye: Secondary | ICD-10-CM | POA: Diagnosis not present

## 2022-11-08 DIAGNOSIS — H33051 Total retinal detachment, right eye: Secondary | ICD-10-CM | POA: Diagnosis not present

## 2022-11-16 DIAGNOSIS — H3521 Other non-diabetic proliferative retinopathy, right eye: Secondary | ICD-10-CM | POA: Diagnosis not present

## 2022-11-16 DIAGNOSIS — H33011 Retinal detachment with single break, right eye: Secondary | ICD-10-CM | POA: Diagnosis not present

## 2022-11-16 DIAGNOSIS — H3341 Traction detachment of retina, right eye: Secondary | ICD-10-CM | POA: Diagnosis not present

## 2022-11-16 DIAGNOSIS — Z9889 Other specified postprocedural states: Secondary | ICD-10-CM | POA: Diagnosis not present

## 2022-11-17 DIAGNOSIS — Z961 Presence of intraocular lens: Secondary | ICD-10-CM | POA: Diagnosis not present

## 2022-11-17 DIAGNOSIS — H2512 Age-related nuclear cataract, left eye: Secondary | ICD-10-CM | POA: Diagnosis not present

## 2022-11-17 DIAGNOSIS — H33013 Retinal detachment with single break, bilateral: Secondary | ICD-10-CM | POA: Diagnosis not present

## 2022-11-17 DIAGNOSIS — H33011 Retinal detachment with single break, right eye: Secondary | ICD-10-CM | POA: Diagnosis not present

## 2022-11-17 DIAGNOSIS — H35372 Puckering of macula, left eye: Secondary | ICD-10-CM | POA: Diagnosis not present

## 2022-11-21 HISTORY — PX: OTHER SURGICAL HISTORY: SHX169

## 2022-12-01 DIAGNOSIS — H33013 Retinal detachment with single break, bilateral: Secondary | ICD-10-CM | POA: Diagnosis not present

## 2022-12-01 DIAGNOSIS — Z1371 Encounter for nonprocreative screening for genetic disease carrier status: Secondary | ICD-10-CM | POA: Diagnosis not present

## 2022-12-06 DIAGNOSIS — E785 Hyperlipidemia, unspecified: Secondary | ICD-10-CM | POA: Diagnosis not present

## 2022-12-06 DIAGNOSIS — Z6836 Body mass index (BMI) 36.0-36.9, adult: Secondary | ICD-10-CM | POA: Diagnosis not present

## 2022-12-06 DIAGNOSIS — K219 Gastro-esophageal reflux disease without esophagitis: Secondary | ICD-10-CM | POA: Diagnosis not present

## 2022-12-06 DIAGNOSIS — I129 Hypertensive chronic kidney disease with stage 1 through stage 4 chronic kidney disease, or unspecified chronic kidney disease: Secondary | ICD-10-CM | POA: Diagnosis not present

## 2022-12-06 DIAGNOSIS — H3341 Traction detachment of retina, right eye: Secondary | ICD-10-CM | POA: Diagnosis not present

## 2022-12-06 DIAGNOSIS — F17290 Nicotine dependence, other tobacco product, uncomplicated: Secondary | ICD-10-CM | POA: Diagnosis not present

## 2022-12-06 DIAGNOSIS — N183 Chronic kidney disease, stage 3 unspecified: Secondary | ICD-10-CM | POA: Diagnosis not present

## 2022-12-06 DIAGNOSIS — Z79899 Other long term (current) drug therapy: Secondary | ICD-10-CM | POA: Diagnosis not present

## 2022-12-06 DIAGNOSIS — G4733 Obstructive sleep apnea (adult) (pediatric): Secondary | ICD-10-CM | POA: Diagnosis not present

## 2022-12-06 DIAGNOSIS — E669 Obesity, unspecified: Secondary | ICD-10-CM | POA: Diagnosis not present

## 2022-12-28 ENCOUNTER — Encounter: Payer: Self-pay | Admitting: Internal Medicine

## 2022-12-28 ENCOUNTER — Ambulatory Visit: Payer: Medicare Other | Admitting: Internal Medicine

## 2022-12-28 VITALS — BP 120/72 | HR 55 | Temp 98.6°F | Ht 76.0 in | Wt 299.0 lb

## 2022-12-28 DIAGNOSIS — R739 Hyperglycemia, unspecified: Secondary | ICD-10-CM | POA: Diagnosis not present

## 2022-12-28 DIAGNOSIS — I10 Essential (primary) hypertension: Secondary | ICD-10-CM

## 2022-12-28 DIAGNOSIS — R351 Nocturia: Secondary | ICD-10-CM | POA: Diagnosis not present

## 2022-12-28 DIAGNOSIS — N401 Enlarged prostate with lower urinary tract symptoms: Secondary | ICD-10-CM

## 2022-12-28 DIAGNOSIS — E559 Vitamin D deficiency, unspecified: Secondary | ICD-10-CM

## 2022-12-28 DIAGNOSIS — Z23 Encounter for immunization: Secondary | ICD-10-CM | POA: Diagnosis not present

## 2022-12-28 DIAGNOSIS — F17200 Nicotine dependence, unspecified, uncomplicated: Secondary | ICD-10-CM | POA: Diagnosis not present

## 2022-12-28 DIAGNOSIS — N1831 Chronic kidney disease, stage 3a: Secondary | ICD-10-CM | POA: Diagnosis not present

## 2022-12-28 DIAGNOSIS — E7800 Pure hypercholesterolemia, unspecified: Secondary | ICD-10-CM

## 2022-12-28 MED ORDER — TADALAFIL 5 MG PO TABS: 5.0000 mg | ORAL_TABLET | Freq: Every day | ORAL | 3 refills | Status: DC

## 2022-12-28 NOTE — Patient Instructions (Signed)
Please take all new medication as prescribed - the cialis 5 mg per day for prostate  Please continue all other medications as before, and refills have been done if requested.  Please have the pharmacy call with any other refills you may need.  Please continue your efforts at being more active, low cholesterol diet, and weight control.  Please keep your appointments with your specialists as you may have planned - ophthalmology  We can hold on more lab testing today  Please make an Appointment to return in 6 months, or sooner if needed

## 2022-12-28 NOTE — Progress Notes (Unsigned)
Patient ID: Antonio Reyes, male   DOB: Mar 24, 1945, 77 y.o.   MRN: [id number]        Chief Complaint: follow up bph, low vit d, low b12, recent bilatearl detached retinas, hld, htn       HPI:  Antonio Reyes is a 77 y.o. male here with c/o worsening urinary stream weakness and frequency nocutria in the past 3-6 months.  Pt denies chest pain, increased sob or doe, wheezing, orthopnea, PND, increased LE swelling, palpitations, dizziness or syncope.   Pt denies polydipsia, polyuria, or new focal neuro s/s.    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  Had left detached retina as well but only required one procedure and stable.  Will eventually need cataract surgury bilateral when stable.  Due for flu shot.   Wt Readings from Last 3 Encounters:  12/28/22 299 lb (135.6 kg)  06/29/22 297 lb (134.7 kg)  12/29/21 (!) 301 lb (136.5 kg)   BP Readings from Last 3 Encounters:  12/28/22 120/72  06/29/22 124/68  12/29/21 126/82         Past Medical History:  Diagnosis Date   Abdominal hernia    Allergic rhinitis    Anxiety    pt states no history of anxiety   Aortic atherosclerosis (HCC) 04/25/2018   noted on CT renal   Benign neoplasm of stomach    BPH (benign prostatic hypertrophy)    pt unaware   C6 cervical fracture (HCC)    C7,C8   Cancer (HCC) 2000   skin cancer   left ear   Chronic sinusitis    CKD (chronic kidney disease), stage III (HCC)    pt unaware   Diverticulitis    Diverticulosis 03/27/2015   Moderate, Noted on colonoscopy   Dizziness    ED (erectile dysfunction)    GERD (gastroesophageal reflux disease)    History of herpes genitalis    History of kidney stones 04/25/2018   4 mm left UVJ stone, 10 mm bladder stone, noted on CT renal   HLD (hyperlipidemia)    HTN (hypertension)    Hydronephrosis     Left, Moderate, noted on CT renal   Inguinal hernia 04/25/2018   Bilateral, noted on CT renal, pt unaware   Low back pain    Migraine    hx of   Neck pain, bilateral    Obesity    Polycythemia 2000   per Dr. Vassie Loll   Rotator cuff tear    Sleep apnea    CPAP   Tubular adenoma of colon 05/2008   Past Surgical History:  Procedure Laterality Date   COLONOSCOPY  03/27/2015   CYSTOSCOPY WITH RETROGRADE PYELOGRAM, URETEROSCOPY AND STENT PLACEMENT Left 05/31/2018   Procedure: CYSTOSCOPY WITH RETROGRADE PYELOGRAM, URETEROSCOPY AND STENT PLACEMENT;  Surgeon: Sebastian Ache, MD;  Location: [facility];  Service: Urology;  Laterality: Left;  1 HR   HERNIA REPAIR     umbilical with mesh   HOLMIUM LASER APPLICATION Left 05/31/2018   Procedure: HOLMIUM LASER APPLICATION;  Surgeon: Sebastian Ache, MD;  Location: [facility];  Service: Urology;  Laterality: Left;   KNEE SURGERY Right    mensicus   2011   POLYPECTOMY     UPPER GI ENDOSCOPY  06/16/2005    reports that he has been smoking cigars. He has  never used smokeless tobacco. He reports current alcohol use of about 1.0 standard drink of alcohol per week. He reports that he does not use drugs. family history includes Colon cancer (age of onset: 24) in his mother; Pancreatic cancer in his paternal grandfather. Allergies  Allergen Reactions   Olive Tree Other (See Comments)   Pollen Extract     Other reaction(s): Other (See Comments)   Current Outpatient Medications on File Prior to Visit  Medication Sig Dispense Refill   acetaZOLAMIDE (DIAMOX) 250 MG tablet Take by mouth.     aspirin 81 MG tablet Take 81 mg by mouth daily.     atorvastatin (LIPITOR) 40 MG tablet TAKE 1 TABLET EVERY DAY 90 tablet 3   atropine 1 % ophthalmic solution Place into the right eye.     Difluprednate 0.05 % EMUL Place 1 drop into the right eye 4 (four) times daily.     erythromycin ophthalmic ointment SMARTSIG:Right Eye Every Evening     fluticasone (FLONASE) 50 MCG/ACT  nasal spray USE 2 SPRAYS IN EACH NOSTRIL EVERY DAY (SUBSTITUTED FOR  FLONASE) 48 g 0   ibuprofen (ADVIL,MOTRIN) 200 MG tablet Take 200 mg by mouth every 6 (six) hours as needed for headache or moderate pain.     metoprolol succinate (TOPROL-XL) 50 MG 24 hr tablet TAKE 1 TABLET EVERY DAY 90 tablet 3   montelukast (SINGULAIR) 10 MG tablet TAKE 1 TABLET EVERY DAY 90 tablet 3   ofloxacin (OCUFLOX) 0.3 % ophthalmic solution PLEASE SEE ATTACHED FOR DETAILED DIRECTIONS     omeprazole (PRILOSEC) 20 MG capsule Take 1 capsule (20 mg total) by mouth daily. 30 capsule 11   prednisoLONE acetate (PRED FORTE) 1 % ophthalmic suspension SMARTSIG:In Eye(s)     Current Facility-Administered Medications on File Prior to Visit  Medication Dose Route Frequency Provider Last Rate Last Admin   gentamicin (GARAMYCIN) 500 mg in dextrose 5 % 100 mL IVPB  500 mg Intravenous Once Manny, Delbert Phenix., MD            ROS:  All others reviewed and negative.  Objective        PE:  BP 120/72 (BP Location: Right Arm, Patient Position: Sitting, Cuff Size: Normal)   Pulse (!) 55   Temp 98.6 F (37 C) (Oral)   Ht 6\' 4"  (1.93 m)   Wt 299 lb (135.6 kg)   SpO2 96%   BMI 36.40 kg/m                 Constitutional: Pt appears in NAD               HENT: Head: NCAT.                Right Ear: External ear normal.                 Left Ear: External ear normal.                Eyes: . Pupils are equal, round, and reactive to light. Conjunctivae and EOM are normal               Nose: without d/c or deformity               Neck: Neck supple. Gross normal ROM               Cardiovascular: Normal rate and regular rhythm.  Pulmonary/Chest: Effort normal and breath sounds without rales or wheezing.                Abd:  Soft, NT, ND, + BS, no organomegaly               Neurological: Pt is alert. At baseline orientation, motor grossly intact               Skin: Skin is warm. No rashes, no other new lesions, LE edema -  none               Psychiatric: Pt behavior is normal without agitation   Micro: none  Cardiac tracings I have personally interpreted today:  none  Pertinent Radiological findings (summarize): none   Lab Results  Component Value Date   WBC 9.0 07/01/2022   HGB 17.0 07/01/2022   HCT 50.2 07/01/2022   PLT 223.0 07/01/2022   GLUCOSE 118 (H) 07/01/2022   CHOL 114 07/01/2022   TRIG 111.0 07/01/2022   HDL 35.40 (L) 07/01/2022   LDLDIRECT 148.7 04/12/2008   LDLCALC 56 07/01/2022   ALT 28 07/01/2022   AST 19 07/01/2022   NA 139 07/01/2022   K 4.3 07/01/2022   CL 105 07/01/2022   CREATININE 1.30 07/01/2022   BUN 17 07/01/2022   CO2 23 07/01/2022   TSH 2.21 07/01/2022   PSA 0.97 07/01/2022   HGBA1C 6.1 07/01/2022   MICROALBUR 0.9 07/01/2022   Assessment/Plan:  Antonio Reyes is a 77 y.o. White or Caucasian [1] male with  has a past medical history of Abdominal hernia, Allergic rhinitis, Anxiety, Aortic atherosclerosis (HCC) (04/25/2018), Benign neoplasm of stomach, BPH (benign prostatic hypertrophy), C6 cervical fracture (HCC), Cancer (HCC) (2000), Chronic sinusitis, CKD (chronic kidney disease), stage III (HCC), Diverticulitis, Diverticulosis (03/27/2015), Dizziness, ED (erectile dysfunction), GERD (gastroesophageal reflux disease), History of herpes genitalis, History of kidney stones (04/25/2018), HLD (hyperlipidemia), HTN (hypertension), Hydronephrosis, Inguinal hernia (04/25/2018), Low back pain, Migraine, Neck pain, bilateral, Obesity, Polycythemia (2000), Rotator cuff tear, Sleep apnea, and Tubular adenoma of colon (05/2008).  HYPERCHOLESTEROLEMIA Lab Results  Component Value Date   LDLCALC 56 07/01/2022   Stable, pt to continue current statin liptior 40 mg qd   Essential hypertension BP Readings from Last 3 Encounters:  12/28/22 120/72  06/29/22 124/68  12/29/21 126/82   Stable, pt to continue medical treatment toprol xl 50 qd   BPH associated with nocturia With  mild worsening, for cialis 5 mg prn  Hyperglycemia Lab Results  Component Value Date   HGBA1C 6.1 07/01/2022   Stable, pt to continue current medical treatment  - diet, wt control   CKD (chronic kidney disease) stage 3, GFR 30-59 ml/min (HCC) Lab Results  Component Value Date   CREATININE 1.30 07/01/2022   Stable overall, cont to avoid nephrotoxins   Vitamin D deficiency Last vitamin D Lab Results  Component Value Date   VD25OH 25.50 (L) 07/01/2022   Low, to start oral replacement   Smoker Pt counsled to quit, pt not ready  Followup: Return in about 6 months (around 06/28/2023).  Oliver Barre, MD 12/30/2022 8:32 AM [facility]

## 2022-12-30 ENCOUNTER — Encounter: Payer: Self-pay | Admitting: Internal Medicine

## 2022-12-30 NOTE — Assessment & Plan Note (Signed)
Lab Results  Component Value Date   CREATININE 1.30 07/01/2022   Stable overall, cont to avoid nephrotoxins

## 2022-12-30 NOTE — Assessment & Plan Note (Signed)
With mild worsening, for cialis 5 mg prn

## 2022-12-30 NOTE — Assessment & Plan Note (Signed)
Last vitamin D Lab Results  Component Value Date   VD25OH 25.50 (L) 07/01/2022   Low, to start oral replacement

## 2022-12-30 NOTE — Assessment & Plan Note (Signed)
Lab Results  Component Value Date   LDLCALC 56 07/01/2022   Stable, pt to continue current statin liptior 40 mg qd

## 2022-12-30 NOTE — Assessment & Plan Note (Signed)
BP Readings from Last 3 Encounters:  12/28/22 120/72  06/29/22 124/68  12/29/21 126/82   Stable, pt to continue medical treatment toprol xl 50 qd

## 2022-12-30 NOTE — Assessment & Plan Note (Signed)
Lab Results  Component Value Date   HGBA1C 6.1 07/01/2022   Stable, pt to continue current medical treatment  - diet, wt control

## 2022-12-30 NOTE — Assessment & Plan Note (Signed)
Pt counsled to quit, pt not ready °

## 2023-01-03 ENCOUNTER — Telehealth: Payer: Self-pay | Admitting: Internal Medicine

## 2023-01-03 MED ORDER — TADALAFIL 5 MG PO TABS: 5.0000 mg | ORAL_TABLET | Freq: Every day | ORAL | 3 refills | Status: DC

## 2023-01-03 NOTE — Telephone Encounter (Signed)
Patient called about the tadalifil - he needs it called into [facility] in [facility] -

## 2023-01-03 NOTE — Telephone Encounter (Signed)
Ok done

## 2023-01-04 DIAGNOSIS — H33013 Retinal detachment with single break, bilateral: Secondary | ICD-10-CM | POA: Diagnosis not present

## 2023-01-04 DIAGNOSIS — Z961 Presence of intraocular lens: Secondary | ICD-10-CM | POA: Diagnosis not present

## 2023-01-04 DIAGNOSIS — Z8669 Personal history of other diseases of the nervous system and sense organs: Secondary | ICD-10-CM | POA: Diagnosis not present

## 2023-01-04 DIAGNOSIS — H2512 Age-related nuclear cataract, left eye: Secondary | ICD-10-CM | POA: Diagnosis not present

## 2023-01-04 DIAGNOSIS — Z9889 Other specified postprocedural states: Secondary | ICD-10-CM | POA: Diagnosis not present

## 2023-01-04 DIAGNOSIS — H35372 Puckering of macula, left eye: Secondary | ICD-10-CM | POA: Diagnosis not present

## 2023-01-31 ENCOUNTER — Ambulatory Visit (INDEPENDENT_AMBULATORY_CARE_PROVIDER_SITE_OTHER): Payer: Medicare Other

## 2023-01-31 VITALS — Ht 76.0 in | Wt 299.0 lb

## 2023-01-31 DIAGNOSIS — Z0000 Encounter for general adult medical examination without abnormal findings: Secondary | ICD-10-CM | POA: Diagnosis not present

## 2023-01-31 NOTE — Patient Instructions (Signed)
Antonio Reyes , Thank you for taking time to come for your Medicare Wellness Visit. I appreciate your ongoing commitment to your health goals. Please review the following plan we discussed and let me know if I can assist you in the future.   Referrals/Orders/Follow-Ups/Clinician Recommendations: Keep up the good work.  This is a list of the screening recommended for you and due dates:  Health Maintenance  Topic Date Due   COVID-19 Vaccine (6 - 2023-24 season) 03/22/2023*   Medicare Annual Wellness Visit  01/31/2024   Colon Cancer Screening  07/07/2024   DTaP/Tdap/Td vaccine (6 - Td or Tdap) 04/24/2028   Pneumonia Vaccine  Completed   Flu Shot  Completed   Hepatitis C Screening  Completed   Zoster (Shingles) Vaccine  Completed   HPV Vaccine  Aged Out  *Topic was postponed. The date shown is not the original due date.    Advanced directives: (Copy Requested) Please bring a copy of your health care power of attorney and living will to the office to be added to your chart at your convenience.  Next Medicare Annual Wellness Visit scheduled for next year: Yes

## 2023-01-31 NOTE — Progress Notes (Signed)
Subjective:   Antonio Reyes is a 77 y.o. male who presents for Medicare Annual/Subsequent preventive examination.  Visit Complete: Virtual I connected with  Antonio Reyes on 01/31/23 by a audio enabled telemedicine application and verified that I am speaking with the correct person using two identifiers.  Patient Location: Home  Provider Location: Office/Clinic  I discussed the limitations of evaluation and management by telemedicine. The patient expressed understanding and agreed to proceed.  Vital Signs: Because this visit was a virtual/telehealth visit, some criteria may be missing or patient reported. Any vitals not documented were not able to be obtained and vitals that have been documented are patient reported.  Cardiac Risk Factors include: advanced age (>86men, >60 women);male gender;hypertension;Other (see comment);dyslipidemia, Risk factor comments: OSA, CKD     Objective:    Today's Vitals   01/31/23 0947  Weight: 299 lb (135.6 kg)  Height: 6\' 4"  (1.93 m)   Body mass index is 36.4 kg/m.     01/31/2023    3:56 PM 01/11/2022    2:43 PM 01/08/2021    8:47 AM 05/31/2018    1:55 PM 05/26/2018    8:31 AM 05/15/2018    9:16 AM 04/30/2018    5:00 PM  Advanced Directives  Does Patient Have a Medical Advance Directive? Yes Yes Yes Yes Yes Yes No  Type of Special educational needs teacher of [place];Living will Living will;Healthcare Power of [employer] Power of [place];Living will Healthcare Power of [place];Living will Living will;Healthcare Power of Attorney   Does patient want to make changes to medical advance directive?   No - Patient declined  No - Patient declined No - Patient declined   Copy of Healthcare Power of Attorney in Chart?  No - copy requested No - copy requested No - copy requested No - copy requested No - copy requested   Would patient like information on creating a medical advance directive?      No - Patient declined No - Patient declined     Current Medications (verified) Outpatient Encounter Medications as of 01/31/2023  Medication Sig   acetaZOLAMIDE (DIAMOX) 250 MG tablet Take by mouth.   aspirin 81 MG tablet Take 81 mg by mouth daily.   atorvastatin (LIPITOR) 40 MG tablet TAKE 1 TABLET EVERY DAY   atropine 1 % ophthalmic solution Place into the right eye.   Difluprednate 0.05 % EMUL Place 1 drop into the right eye 4 (four) times daily.   erythromycin ophthalmic ointment SMARTSIG:Right Eye Every Evening   fluticasone (FLONASE) 50 MCG/ACT nasal spray USE 2 SPRAYS IN EACH NOSTRIL EVERY DAY (SUBSTITUTED FOR  FLONASE)   ibuprofen (ADVIL,MOTRIN) 200 MG tablet Take 200 mg by mouth every 6 (six) hours as needed for headache or moderate pain.   metoprolol succinate (TOPROL-XL) 50 MG 24 hr tablet TAKE 1 TABLET EVERY DAY   montelukast (SINGULAIR) 10 MG tablet TAKE 1 TABLET EVERY DAY   ofloxacin (OCUFLOX) 0.3 % ophthalmic solution PLEASE SEE ATTACHED FOR DETAILED DIRECTIONS   omeprazole (PRILOSEC) 20 MG capsule Take 1 capsule (20 mg total) by mouth daily.   prednisoLONE acetate (PRED FORTE) 1 % ophthalmic suspension SMARTSIG:In Eye(s)   tadalafil (CIALIS) 5 MG tablet Take 1 tablet (5 mg total) by mouth daily.   Facility-Administered Encounter Medications as of 01/31/2023  Medication   gentamicin (GARAMYCIN) 500 mg in dextrose 5 % 100 mL IVPB    Allergies (verified) Olive tree and Pollen extract   History: Past Medical History:  Diagnosis Date   Abdominal hernia    Allergic rhinitis    Anxiety    pt states no history of anxiety   Aortic atherosclerosis (HCC) 04/25/2018   noted on CT renal   Benign neoplasm of stomach    BPH (benign prostatic hypertrophy)    pt unaware   C6 cervical fracture (HCC)    C7,C8   Cancer (HCC) 2000   skin cancer   left ear   Chronic sinusitis    CKD (chronic kidney disease), stage III (HCC)    pt unaware   Diverticulitis    Diverticulosis 03/27/2015   Moderate, Noted on colonoscopy    Dizziness    ED (erectile dysfunction)    GERD (gastroesophageal reflux disease)    History of herpes genitalis    History of kidney stones 04/25/2018   4 mm left UVJ stone, 10 mm bladder stone, noted on CT renal   HLD (hyperlipidemia)    HTN (hypertension)    Hydronephrosis    Left, Moderate, noted on CT renal   Inguinal hernia 04/25/2018   Bilateral, noted on CT renal, pt unaware   Low back pain    Migraine    hx of   Neck pain, bilateral    Obesity    Polycythemia 2000   per Dr. Vassie Loll   Rotator cuff tear    Sleep apnea    CPAP   Tubular adenoma of colon 05/2008   Past Surgical History:  Procedure Laterality Date   COLONOSCOPY  03/27/2015   CYSTOSCOPY WITH RETROGRADE PYELOGRAM, URETEROSCOPY AND STENT PLACEMENT Left 05/31/2018   Procedure: CYSTOSCOPY WITH RETROGRADE PYELOGRAM, URETEROSCOPY AND STENT PLACEMENT;  Surgeon: Sebastian Ache, MD;  Location: [facility];  Service: Urology;  Laterality: Left;  1 HR   HERNIA REPAIR     umbilical with mesh   HOLMIUM LASER APPLICATION Left 05/31/2018   Procedure: HOLMIUM LASER APPLICATION;  Surgeon: Sebastian Ache, MD;  Location: [facility];  Service: Urology;  Laterality: Left;   KNEE SURGERY Right    mensicus   2011   POLYPECTOMY     UPPER GI ENDOSCOPY  06/16/2005   viterectomy Right 11/2022   on Right eye   Family History  Problem Relation Age of Onset   Colon cancer Mother 36   Pancreatic cancer Paternal Grandfather    Colon polyps Neg Hx    Esophageal cancer Neg Hx    Stomach cancer Neg Hx    Rectal cancer Neg Hx    Social History   Socioeconomic History   Marital status: Married    Spouse name: Antonio Reyes   Number of children: 1   Years of education: Not on file   Highest education level: Not on file  Occupational History   Occupation: Airline pilot and marketing ( Psychologist, educational)   Occupation: Retired  Tobacco Use   Smoking status: Light Smoker    Types: Cigars   Smokeless tobacco: Never   Tobacco comments:    occasionally  every  3 months  Vaping Use   Vaping status: Never Used  Substance and Sexual Activity   Alcohol use: Yes    Alcohol/week: 1.0 standard drink of alcohol    Types: 1 Standard drinks or equivalent per week    Comment: rare   Drug use: No   Sexual activity: Yes  Other Topics Concern   Not on file  Social History Narrative   Lives with wife.   Social Determinants of Health   Financial Resource Strain: Low Risk  (  01/31/2023)   Overall Financial Resource Strain (CARDIA)    Difficulty of Paying Living Expenses: Not hard at all  Food Insecurity: No Food Insecurity (01/31/2023)   Hunger Vital Sign    Worried About Running Out of Food in the Last Year: Never true    Ran Out of Food in the Last Year: Never true  Transportation Needs: No Transportation Needs (01/31/2023)   PRAPARE - Administrator, Civil Service (Medical): No    Lack of Transportation (Non-Medical): No  Physical Activity: Inactive (01/31/2023)   Exercise Vital Sign    Days of Exercise per Week: 0 days    Minutes of Exercise per Session: 0 min  Stress: Stress Concern Present (01/31/2023)   [employer] of Occupational Health - Occupational Stress Questionnaire    Feeling of Stress : To some extent  Social Connections: Moderately Integrated (01/31/2023)   Social Connection and Isolation Panel [NHANES]    Frequency of Communication with Friends and Family: More than three times a week    Frequency of Social Gatherings with Friends and Family: Once a week    Attends Religious Services: More than 4 times per year    Active Member of [employer] or Organizations: No    Attends Engineer, structural: Never    Marital Status: Married    Tobacco Counseling Ready to quit: Not Answered Counseling given: Not Answered Tobacco comments: occasionally  every 3 months   Clinical Intake:  Pre-visit preparation completed: Yes  Pain : No/denies pain     BMI - recorded: 36.4 Nutritional Status: BMI > 30   Obese Nutritional Risks: None Diabetes: No  How often do you need to have someone help you when you read instructions, pamphlets, or other written materials from your doctor or pharmacy?: 1 - Never  Interpreter Needed?: No  Information entered by :: Layan Zalenski, RMA   Activities of Daily Living    01/31/2023    3:52 PM  In your present state of health, do you have any difficulty performing the following activities:  Hearing? 0  Vision? 0  Difficulty concentrating or making decisions? 0  Walking or climbing stairs? 0  Dressing or bathing? 0  Doing errands, shopping? 0  Preparing Food and eating ? N  Using the Toilet? N  In the past six months, have you accidently leaked urine? N  Do you have problems with loss of bowel control? N  Managing your Medications? N  Managing your Finances? N  Housekeeping or managing your Housekeeping? N    Patient Care Team: Corwin Levins, MD as PCP - General Branch, Alben Spittle, MD as PCP - Cardiology (Cardiology) Shon Millet, MD as Consulting Physician (Ophthalmology) Cherlyn Roberts, MD as Referring Physician (Dermatology) Center, [facility]  Indicate any recent Medical Services you may have received from other than [facility] providers in the past year (date may be approximate).     Assessment:   This is a routine wellness examination for Kristofer.  Hearing/Vision screen Hearing Screening - Comments:: Denies hearing difficulties   Vision Screening - Comments:: Denies vision issues.    Goals Addressed             This Visit's Progress    My goal is to lose 50-60 pounds.   On track     Depression Screen    01/31/2023    4:00 PM 12/28/2022   10:03 AM 06/29/2022    1:44 PM 01/11/2022    2:44 PM 12/29/2021  2:51 PM 08/05/2021   11:11 AM 05/20/2021    8:46 AM  PHQ 2/9 Scores  PHQ - 2 Score 0 0 0 0 0 0 0  PHQ- 9 Score 0  1        Fall Risk    01/31/2023    3:56 PM 12/28/2022   10:03 AM 06/29/2022    1:45 PM 01/11/2022     2:44 PM 12/29/2021    2:52 PM  Fall Risk   Falls in the past year? 0 0 0 0 0  Number falls in past yr: 0 0 0 0 0  Injury with Fall? 0 0 0 0 0  Risk for fall due to : No Fall Risks No Fall Risks No Fall Risks No Fall Risks No Fall Risks  Follow up Falls prevention discussed;Falls evaluation completed Falls evaluation completed Falls evaluation completed;Education provided Falls prevention discussed Falls evaluation completed    MEDICARE RISK AT HOME: Medicare Risk at Home Any stairs in or around the home?: Yes If so, are there any without handrails?: Yes Home free of loose throw rugs in walkways, pet beds, electrical cords, etc?: Yes Adequate lighting in your home to reduce risk of falls?: Yes Life alert?: No Use of a cane, walker or w/c?: No Grab bars in the bathroom?: No Shower chair or bench in shower?: Yes Elevated toilet seat or a handicapped toilet?: Yes  TIMED UP AND GO:  Was the test performed?  No    Cognitive Function:        01/31/2023    3:58 PM 01/11/2022    2:59 PM  6CIT Screen  What Year? 0 points 0 points  What month? 0 points 0 points  What time? 0 points 0 points  Count back from 20 0 points 0 points  Months in reverse 0 points 0 points  Repeat phrase 0 points 0 points  Total Score 0 points 0 points    Immunizations Immunization History  Administered Date(s) Administered   Fluad Quad(high Dose 65+) 12/29/2021   Fluad Trivalent(High Dose 65+) 12/28/2022   H1N1 04/12/2008   Influenza Split 01/22/2008, 01/28/2009, 12/23/2012   Influenza Whole 01/22/2008, 01/28/2009   Influenza, High Dose Seasonal PF 02/13/2014, 04/21/2016, 12/21/2016, 01/17/2018, 12/23/2018, 01/07/2020   Influenza, Seasonal, Injecte, Preservative Fre 12/20/2012   Influenza-Unspecified 12/20/2012, 12/23/2012, 11/28/2020   Novel Infuenza-h1n1-09 04/12/2008   PFIZER(Purple Top)SARS-COV-2 Vaccination 04/28/2019, 05/19/2019, 12/14/2019, 10/15/2020   Pfizer(Comirnaty)Fall Seasonal  Vaccine 12 years and older 01/22/2022   Pneumococcal Conjugate-13 02/09/2013   Pneumococcal Polysaccharide-23 04/17/2014   Td 04/12/2008   Td (Adult),5 Lf Tetanus Toxid, Preservative Free 04/12/2008   Tdap 04/12/2008, 08/23/2014, 04/24/2018   Zoster Recombinant(Shingrix) 11/28/2020, 02/16/2021   Zoster, Live 04/12/2008    TDAP status: Up to date  Flu Vaccine status: Up to date  Pneumococcal vaccine status: Up to date  Covid-19 vaccine status: Information provided on how to obtain vaccines.   Qualifies for Shingles Vaccine? Yes   Zostavax completed Yes   Shingrix Completed?: Yes  Screening Tests Health Maintenance  Topic Date Due   COVID-19 Vaccine (6 - 2023-24 season) 03/22/2023 (Originally 11/21/2022)   Medicare Annual Wellness (AWV)  01/31/2024   Colonoscopy  07/07/2024   DTaP/Tdap/Td (6 - Td or Tdap) 04/24/2028   Pneumonia Vaccine 57+ Years old  Completed   INFLUENZA VACCINE  Completed   Hepatitis C Screening  Completed   Zoster Vaccines- Shingrix  Completed   HPV VACCINES  Aged Out    Health Maintenance  There  are no preventive care reminders to display for this patient.   Colorectal cancer screening: Type of screening: Colonoscopy. Completed 07/07/2021. Repeat every 3 years  Lung Cancer Screening: (Low Dose CT Chest recommended if Age 53-80 years, 20 pack-year currently smoking OR have quit w/in 15years.) does not qualify.   Lung Cancer Screening Referral: N/A  Additional Screening:  Hepatitis C Screening: does qualify; Completed 04/05/2016  Vision Screening: Recommended annual ophthalmology exams for early detection of glaucoma and other disorders of the eye. Is the patient up to date with their annual eye exam?  Yes  Who is the provider or what is the name of the office in which the patient attends annual eye exams? [facility]  If pt is not established with a provider, would they like to be referred to a provider to establish care? No .   Dental  Screening: Recommended annual dental exams for proper oral hygiene   Community Resource Referral / Chronic Care Management: CRR required this visit?  No   CCM required this visit?  No     Plan:     I have personally reviewed and noted the following in the patient's chart:   Medical and social history Use of alcohol, tobacco or illicit drugs  Current medications and supplements including opioid prescriptions. Patient is not currently taking opioid prescriptions. Functional ability and status Nutritional status Physical activity Advanced directives List of other physicians Hospitalizations, surgeries, and ER visits in previous 12 months Vitals Screenings to include cognitive, depression, and falls Referrals and appointments  In addition, I have reviewed and discussed with patient certain preventive protocols, quality metrics, and best practice recommendations. A written personalized care plan for preventive services as well as general preventive health recommendations were provided to patient.     Levoy Geisen L Rini Moffit, CMA   01/31/2023   After Visit Summary: (MyChart) Due to this being a telephonic visit, the after visit summary with patients personalized plan was offered to patient via MyChart   Nurse Notes: Patient is due for a Covid vaccine, but he stated that he will think about it.  He is up to date on all other health maintenance.  Patient had no other concerns to address today.

## 2023-02-03 ENCOUNTER — Encounter: Payer: Self-pay | Admitting: Family Medicine

## 2023-02-03 ENCOUNTER — Ambulatory Visit: Payer: Medicare Other | Admitting: Family Medicine

## 2023-02-03 VITALS — BP 132/84 | HR 68 | Wt 298.6 lb

## 2023-02-03 DIAGNOSIS — R0981 Nasal congestion: Secondary | ICD-10-CM

## 2023-02-03 DIAGNOSIS — J069 Acute upper respiratory infection, unspecified: Secondary | ICD-10-CM

## 2023-02-03 LAB — POCT INFLUENZA A/B
Influenza A, POC: NEGATIVE
Influenza B, POC: NEGATIVE

## 2023-02-03 LAB — POC COVID19 BINAXNOW: SARS Coronavirus 2 Ag: NEGATIVE

## 2023-02-03 MED ORDER — AZITHROMYCIN 250 MG PO TABS: ORAL_TABLET | ORAL | 0 refills | Status: DC

## 2023-02-03 NOTE — Progress Notes (Signed)
OFFICE VISIT  02/03/2023  CC:  Chief Complaint  Patient presents with   Nasal Congestion    A little less than a week; Cough, head/chest congestion, denies fever and nausea. Pt was in [place] last week. Has been taking Mucinex and found some relief.     Patient is a 77 y.o. male who presents for head and chest congestion.  HPI: 1 week ago Dristen got a scratchy throat, lost his voice, started to get significant nasal congestion/sinus pressure, postnasal drip.  Developed a cough and significant fatigue.  Has heard some wheezing intermittently but feels like this does clear up when he coughs a few times.  No fever. Denies shortness of breath or chest pain. He has not improved.  Past Medical History:  Diagnosis Date   Abdominal hernia    Allergic rhinitis    Anxiety    pt states no history of anxiety   Aortic atherosclerosis (HCC) 04/25/2018   noted on CT renal   Benign neoplasm of stomach    BPH (benign prostatic hypertrophy)    pt unaware   C6 cervical fracture (HCC)    C7,C8   Cancer (HCC) 2000   skin cancer   left ear   Chronic sinusitis    CKD (chronic kidney disease), stage III (HCC)    pt unaware   Diverticulitis    Diverticulosis 03/27/2015   Moderate, Noted on colonoscopy   Dizziness    ED (erectile dysfunction)    GERD (gastroesophageal reflux disease)    History of herpes genitalis    History of kidney stones 04/25/2018   4 mm left UVJ stone, 10 mm bladder stone, noted on CT renal   HLD (hyperlipidemia)    HTN (hypertension)    Hydronephrosis    Left, Moderate, noted on CT renal   Inguinal hernia 04/25/2018   Bilateral, noted on CT renal, pt unaware   Low back pain    Migraine    hx of   Neck pain, bilateral    Obesity    Polycythemia 2000   per Dr. Vassie Loll   Rotator cuff tear    Sleep apnea    CPAP   Tubular adenoma of colon 05/2008    Past Surgical History:  Procedure Laterality Date   COLONOSCOPY  03/27/2015   CYSTOSCOPY WITH RETROGRADE PYELOGRAM,  URETEROSCOPY AND STENT PLACEMENT Left 05/31/2018   Procedure: CYSTOSCOPY WITH RETROGRADE PYELOGRAM, URETEROSCOPY AND STENT PLACEMENT;  Surgeon: Sebastian Ache, MD;  Location: [facility];  Service: Urology;  Laterality: Left;  1 HR   HERNIA REPAIR     umbilical with mesh   HOLMIUM LASER APPLICATION Left 05/31/2018   Procedure: HOLMIUM LASER APPLICATION;  Surgeon: Sebastian Ache, MD;  Location: [facility];  Service: Urology;  Laterality: Left;   KNEE SURGERY Right    mensicus   2011   POLYPECTOMY     UPPER GI ENDOSCOPY  06/16/2005   viterectomy Right 11/2022   on Right eye    Outpatient Medications Prior to Visit  Medication Sig Dispense Refill   acetaZOLAMIDE (DIAMOX) 250 MG tablet Take by mouth.     aspirin 81 MG tablet Take 81 mg by mouth daily.     atorvastatin (LIPITOR) 40 MG tablet TAKE 1 TABLET EVERY DAY 90 tablet 3   atropine 1 % ophthalmic solution Place into the right eye.     Difluprednate 0.05 % EMUL Place 1 drop into the right eye 4 (four) times daily.     erythromycin ophthalmic ointment SMARTSIG:Right  Eye Every Evening     fluticasone (FLONASE) 50 MCG/ACT nasal spray USE 2 SPRAYS IN EACH NOSTRIL EVERY DAY (SUBSTITUTED FOR  FLONASE) 48 g 0   ibuprofen (ADVIL,MOTRIN) 200 MG tablet Take 200 mg by mouth every 6 (six) hours as needed for headache or moderate pain.     metoprolol succinate (TOPROL-XL) 50 MG 24 hr tablet TAKE 1 TABLET EVERY DAY 90 tablet 3   montelukast (SINGULAIR) 10 MG tablet TAKE 1 TABLET EVERY DAY 90 tablet 3   ofloxacin (OCUFLOX) 0.3 % ophthalmic solution PLEASE SEE ATTACHED FOR DETAILED DIRECTIONS     omeprazole (PRILOSEC) 20 MG capsule Take 1 capsule (20 mg total) by mouth daily. 30 capsule 11   prednisoLONE acetate (PRED FORTE) 1 % ophthalmic suspension SMARTSIG:In Eye(s)     tadalafil (CIALIS) 5 MG tablet Take 1 tablet (5 mg total) by mouth daily. 90 tablet 3   Facility-Administered Medications Prior to Visit  Medication Dose Route Frequency Provider Last  Rate Last Admin   gentamicin (GARAMYCIN) 500 mg in dextrose 5 % 100 mL IVPB  500 mg Intravenous Once Berneice Heinrich Delbert Phenix., MD        Allergies  Allergen Reactions   Olive Tree Other (See Comments)   Pollen Extract     Other reaction(s): Other (See Comments)    Review of Systems  As per HPI  PE:    02/03/2023    4:13 PM 02/03/2023    4:04 PM 01/31/2023    9:47 AM  Vitals with BMI  Height   6\' 4"   Weight  298 lbs 10 oz 299 lbs  BMI  36.36 36.41  Systolic 132 146   Diastolic 84 83   Pulse  68     Physical Exam  VS: noted--normal. Gen: alert, NAD, NONTOXIC APPEARING. HEENT: eyes without injection, drainage, or swelling.  Ears: EACs clear, TMs with normal light reflex and landmarks.  Nose: Clear rhinorrhea, with some dried, crusty exudate adherent to mildly injected mucosa.  No purulent d/c.  No paranasal sinus TTP.  No facial swelling.  Throat and mouth without focal lesion.  No pharyngial swelling, erythema, or exudate.   Neck: supple, no LAD.   LUNGS: CTA bilat, nonlabored resps.   CV: RRR, no m/r/g. EXT: no c/c/e SKIN: no rash   LABS:  Last CBC Lab Results  Component Value Date   WBC 9.0 07/01/2022   HGB 17.0 07/01/2022   HCT 50.2 07/01/2022   MCV 91.1 07/01/2022   MCH 30.0 05/26/2018   RDW 13.4 07/01/2022   PLT 223.0 07/01/2022   Last metabolic panel Lab Results  Component Value Date   GLUCOSE 118 (H) 07/01/2022   NA 139 07/01/2022   K 4.3 07/01/2022   CL 105 07/01/2022   CO2 23 07/01/2022   BUN 17 07/01/2022   CREATININE 1.30 07/01/2022   GFR 54.00 (L) 07/01/2022   CALCIUM 9.2 07/01/2022   PHOS 4.0 05/05/2020   PROT 6.4 07/01/2022   ALBUMIN 4.2 07/01/2022   BILITOT 0.6 07/01/2022   ALKPHOS 108 07/01/2022   AST 19 07/01/2022   ALT 28 07/01/2022   ANIONGAP 10 05/26/2018   Lab Results  Component Value Date   HGBA1C 6.1 07/01/2022   IMPRESSION AND PLAN:  Prolonged URI with cough and congestion. Rapid flu and COVID negative here  today. Z-Pak prescribed--> patient states he consistently responds to this particular antibiotic for this type of illness. Symptomatic care discussed.  An After Visit Summary was printed and  given to the patient.  FOLLOW UP: No follow-ups on file.  Signed:  Santiago Bumpers, MD           02/03/2023

## 2023-03-07 ENCOUNTER — Other Ambulatory Visit: Payer: Self-pay | Admitting: Gastroenterology

## 2023-03-29 ENCOUNTER — Encounter: Payer: Self-pay | Admitting: Internal Medicine

## 2023-03-29 ENCOUNTER — Ambulatory Visit (INDEPENDENT_AMBULATORY_CARE_PROVIDER_SITE_OTHER): Payer: Medicare Other | Admitting: Internal Medicine

## 2023-03-29 VITALS — BP 118/84 | HR 78 | Temp 98.4°F | Ht 76.0 in | Wt 294.0 lb

## 2023-03-29 DIAGNOSIS — J0110 Acute frontal sinusitis, unspecified: Secondary | ICD-10-CM | POA: Diagnosis not present

## 2023-03-29 MED ORDER — DOXYCYCLINE HYCLATE 100 MG PO TABS: 100.0000 mg | ORAL_TABLET | Freq: Two times a day (BID) | ORAL | 0 refills | Status: DC

## 2023-03-29 NOTE — Assessment & Plan Note (Signed)
 Suspect this is causing his symptoms with 2-3 weeks of worsening symptoms and rhonchi on exam covering for sinus and CAP infection. Rx doxycycline 1 week and advised to start flonase he has at home and add humidification to his CPAP (he has but not using).

## 2023-03-29 NOTE — Progress Notes (Signed)
   Subjective:   Patient ID: Antonio Reyes, male    DOB: [birth year], 78 y.o.   MRN: [id number]  HPI The patient is a 78 YO man coming in for 2 months of sinus symptoms but worse in last 2-3 weeks. Overall was improving and takes singulair . Has flonase  but not using. Now acute illness last 2-3 weeks with fever initially and then some mild SOB and coughing. Using netty pot and this is helping sinuses but not lungs. Overall worsening.   Review of Systems  Constitutional:  Positive for activity change, appetite change and chills. Negative for fatigue, fever and unexpected weight change.  HENT:  Positive for congestion, postnasal drip, rhinorrhea and sinus pressure. Negative for ear discharge, ear pain, sinus pain, sneezing, sore throat, tinnitus, trouble swallowing and voice change.   Eyes: Negative.   Respiratory:  Positive for cough and shortness of breath. Negative for chest tightness and wheezing.   Cardiovascular: Negative.   Gastrointestinal: Negative.   Musculoskeletal:  Positive for myalgias.  Neurological: Negative.     Objective:  Physical Exam Constitutional:      Appearance: He is well-developed.  HENT:     Head: Normocephalic and atraumatic.     Comments: Oropharynx with redness and clear drainage, nose with swollen turbinates, TMs normal bilaterally.  Neck:     Thyroid : No thyromegaly.  Cardiovascular:     Rate and Rhythm: Normal rate and regular rhythm.  Pulmonary:     Effort: Pulmonary effort is normal. No respiratory distress.     Breath sounds: Rhonchi present. No wheezing or rales.  Abdominal:     Palpations: Abdomen is soft.  Musculoskeletal:        General: Tenderness present.     Cervical back: Normal range of motion.  Lymphadenopathy:     Cervical: No cervical adenopathy.  Skin:    General: Skin is warm and dry.  Neurological:     Mental Status: He is alert and oriented to person, place, and time.     Vitals:   03/29/23 0947  BP: 118/84  Pulse: 78   Temp: 98.4 F (36.9 C)  TempSrc: Oral  SpO2: 96%  Weight: 294 lb (133.4 kg)  Height: 6' 4 (1.93 m)    Assessment & Plan:

## 2023-03-29 NOTE — Patient Instructions (Signed)
 We have sent in doxycycline to take 1 pill twice a day for 1 week.

## 2023-03-31 DIAGNOSIS — D225 Melanocytic nevi of trunk: Secondary | ICD-10-CM | POA: Diagnosis not present

## 2023-03-31 DIAGNOSIS — L821 Other seborrheic keratosis: Secondary | ICD-10-CM | POA: Diagnosis not present

## 2023-03-31 DIAGNOSIS — L814 Other melanin hyperpigmentation: Secondary | ICD-10-CM | POA: Diagnosis not present

## 2023-04-04 ENCOUNTER — Other Ambulatory Visit: Payer: Self-pay | Admitting: Internal Medicine

## 2023-04-04 ENCOUNTER — Other Ambulatory Visit: Payer: Self-pay

## 2023-04-19 DIAGNOSIS — Z8669 Personal history of other diseases of the nervous system and sense organs: Secondary | ICD-10-CM | POA: Diagnosis not present

## 2023-04-19 DIAGNOSIS — H2512 Age-related nuclear cataract, left eye: Secondary | ICD-10-CM | POA: Diagnosis not present

## 2023-04-19 DIAGNOSIS — H35372 Puckering of macula, left eye: Secondary | ICD-10-CM | POA: Diagnosis not present

## 2023-04-19 DIAGNOSIS — Z961 Presence of intraocular lens: Secondary | ICD-10-CM | POA: Diagnosis not present

## 2023-06-07 ENCOUNTER — Other Ambulatory Visit: Payer: Self-pay

## 2023-06-07 ENCOUNTER — Other Ambulatory Visit: Payer: Self-pay | Admitting: Internal Medicine

## 2023-07-05 ENCOUNTER — Ambulatory Visit: Payer: Medicare Other | Admitting: Internal Medicine

## 2023-07-11 ENCOUNTER — Ambulatory Visit (INDEPENDENT_AMBULATORY_CARE_PROVIDER_SITE_OTHER): Payer: Medicare Other | Admitting: Internal Medicine

## 2023-07-11 ENCOUNTER — Encounter: Payer: Self-pay | Admitting: Internal Medicine

## 2023-07-11 VITALS — BP 122/78 | HR 56 | Temp 98.2°F | Ht 76.0 in | Wt 293.0 lb

## 2023-07-11 DIAGNOSIS — R931 Abnormal findings on diagnostic imaging of heart and coronary circulation: Secondary | ICD-10-CM

## 2023-07-11 DIAGNOSIS — E559 Vitamin D deficiency, unspecified: Secondary | ICD-10-CM | POA: Diagnosis not present

## 2023-07-11 DIAGNOSIS — R739 Hyperglycemia, unspecified: Secondary | ICD-10-CM

## 2023-07-11 DIAGNOSIS — E7800 Pure hypercholesterolemia, unspecified: Secondary | ICD-10-CM | POA: Diagnosis not present

## 2023-07-11 DIAGNOSIS — I10 Essential (primary) hypertension: Secondary | ICD-10-CM | POA: Diagnosis not present

## 2023-07-11 DIAGNOSIS — N1831 Chronic kidney disease, stage 3a: Secondary | ICD-10-CM

## 2023-07-11 DIAGNOSIS — E538 Deficiency of other specified B group vitamins: Secondary | ICD-10-CM | POA: Diagnosis not present

## 2023-07-11 NOTE — Assessment & Plan Note (Signed)
Last vitamin D Lab Results  Component Value Date   VD25OH 25.50 (L) 07/01/2022   Low, to start oral replacement

## 2023-07-11 NOTE — Assessment & Plan Note (Signed)
 BP Readings from Last 3 Encounters:  07/11/23 122/78  03/29/23 118/84  02/03/23 132/84   Stable, pt to continue medical treatment toprol  xl 50 qd

## 2023-07-11 NOTE — Patient Instructions (Signed)
 Ok to increase the Vit D to 2000 units per day  Ok to start a Multivitamin for the B12 as well  Please continue all other medications as before, and refills have been done if requested.  Please have the pharmacy call with any other refills you may need.  Please continue your efforts at being more active, low cholesterol diet, and weight control.  You are otherwise up to date with prevention measures today.  Please keep your appointments with your specialists as you may have planned - Dr Julio Ohm for the left knee  You will be contacted regarding the referral for: Cardiac CT score  We can hold on lab testing today  Please make an Appointment to return in 6 months, or sooner if needed

## 2023-07-11 NOTE — Assessment & Plan Note (Signed)
Lab Results  Component Value Date   CREATININE 1.30 07/01/2022   Stable overall, cont to avoid nephrotoxins

## 2023-07-11 NOTE — Assessment & Plan Note (Signed)
 Lab Results  Component Value Date   LDLCALC 56 07/01/2022   Stable, pt to continue current statin lipitor 40 mg qd

## 2023-07-11 NOTE — Assessment & Plan Note (Signed)
 Also for CT Cardiac Score follow up

## 2023-07-11 NOTE — Assessment & Plan Note (Signed)
Lab Results  Component Value Date   HGBA1C 6.1 07/01/2022   Stable, pt to continue current medical treatment  - diet, wt control

## 2023-07-11 NOTE — Assessment & Plan Note (Signed)
 Lab Results  Component Value Date   VITAMINB12 277 07/01/2022   Low, to start oral replacement - b12 1000 mcg qd

## 2023-07-11 NOTE — Progress Notes (Signed)
 Patient ID: Antonio Reyes, male   DOB: [birth year], 78 y.o.   MRN: [id number]        Chief Complaint: follow up low vit d, low b12, CAD, left knee DJD, ckd3a       HPI:  Antonio Reyes is a 78 y.o. male here overall doing ok, Pt denies chest pain, increased sob or doe, wheezing, orthopnea, PND, increased LE swelling, palpitations, dizziness or syncope, but is interested in f/u Cardiac CT score.  Has worsening 1 mo left knee pain, known DJD, now mod to severe pain, plans to f/u with ortho Dr Julio Ohm soon.    Wt Readings from Last 3 Encounters:  07/11/23 293 lb (132.9 kg)  03/29/23 294 lb (133.4 kg)  02/03/23 298 lb 9.6 oz (135.4 kg)   BP Readings from Last 3 Encounters:  07/11/23 122/78  03/29/23 118/84  02/03/23 132/84         Past Medical History:  Diagnosis Date   Abdominal hernia    Allergic rhinitis    Anxiety    pt states no history of anxiety   Aortic atherosclerosis (HCC) 04/25/2018   noted on CT renal   Benign neoplasm of stomach    BPH (benign prostatic hypertrophy)    pt unaware   C6 cervical fracture (HCC)    C7,C8   Cancer (HCC) 2000   skin cancer   left ear   Chronic sinusitis    CKD (chronic kidney disease), stage III (HCC)    pt unaware   Diverticulitis    Diverticulosis 03/27/2015   Moderate, Noted on colonoscopy   Dizziness    ED (erectile dysfunction)    GERD (gastroesophageal reflux disease)    History of herpes genitalis    History of kidney stones 04/25/2018   4 mm left UVJ stone, 10 mm bladder stone, noted on CT renal   HLD (hyperlipidemia)    HTN (hypertension)    Hydronephrosis    Left, Moderate, noted on CT renal   Inguinal hernia 04/25/2018   Bilateral, noted on CT renal, pt unaware   Low back pain    Migraine    hx of   Neck pain, bilateral    Obesity    Polycythemia 2000   per Dr. Villa Greaser   Rotator cuff tear    Sleep apnea    CPAP   Tubular adenoma of colon 05/2008   Past Surgical History:  Procedure Laterality Date   COLONOSCOPY   03/27/2015   CYSTOSCOPY WITH RETROGRADE PYELOGRAM, URETEROSCOPY AND STENT PLACEMENT Left 05/31/2018   Procedure: CYSTOSCOPY WITH RETROGRADE PYELOGRAM, URETEROSCOPY AND STENT PLACEMENT;  Surgeon: Osborn Blaze, MD;  Location: [facility];  Service: Urology;  Laterality: Left;  1 HR   HERNIA REPAIR     umbilical with mesh   HOLMIUM LASER APPLICATION Left 05/31/2018   Procedure: HOLMIUM LASER APPLICATION;  Surgeon: Osborn Blaze, MD;  Location: [facility];  Service: Urology;  Laterality: Left;   KNEE SURGERY Right    mensicus   2011   POLYPECTOMY     UPPER GI ENDOSCOPY  06/16/2005   viterectomy Right 11/2022   on Right eye    reports that he has been smoking cigars. He has never used smokeless tobacco. He reports current alcohol use of about 1.0 standard drink of alcohol per week. He reports that he does not use drugs. family history includes Colon cancer (age of onset: 90) in his mother; Pancreatic cancer in his paternal grandfather. Allergies  Allergen Reactions  Olive Tree Other (See Comments)   Pollen Extract     Other reaction(s): Other (See Comments)   Current Outpatient Medications on File Prior to Visit  Medication Sig Dispense Refill   acetaZOLAMIDE (DIAMOX) 250 MG tablet Take by mouth.     aspirin 81 MG tablet Take 81 mg by mouth daily.     atorvastatin  (LIPITOR) 40 MG tablet TAKE 1 TABLET EVERY DAY 90 tablet 3   atropine 1 % ophthalmic solution Place into the right eye.     Difluprednate 0.05 % EMUL Place 1 drop into the right eye 4 (four) times daily.     doxycycline  (VIBRA -TABS) 100 MG tablet Take 1 tablet (100 mg total) by mouth 2 (two) times daily. 14 tablet 0   erythromycin ophthalmic ointment SMARTSIG:Right Eye Every Evening     fluticasone  (FLONASE ) 50 MCG/ACT nasal spray USE 2 SPRAYS IN EACH NOSTRIL EVERY DAY (SUBSTITUTED FOR FLONASE ) (NEED MD APPOINTMENT FOR REFILLS) 48 g 3   ibuprofen (ADVIL,MOTRIN) 200 MG tablet Take 200 mg by mouth every 6 (six) hours as needed for  headache or moderate pain.     metoprolol  succinate (TOPROL -XL) 50 MG 24 hr tablet TAKE 1 TABLET EVERY DAY 90 tablet 3   montelukast  (SINGULAIR ) 10 MG tablet TAKE 1 TABLET EVERY DAY 90 tablet 3   ofloxacin (OCUFLOX) 0.3 % ophthalmic solution PLEASE SEE ATTACHED FOR DETAILED DIRECTIONS     omeprazole  (PRILOSEC) 20 MG capsule Take 1 capsule (20 mg total) by mouth daily. 30 capsule 11   prednisoLONE acetate (PRED FORTE) 1 % ophthalmic suspension SMARTSIG:In Eye(s)     tadalafil  (CIALIS ) 5 MG tablet Take 1 tablet (5 mg total) by mouth daily. 90 tablet 3   No current facility-administered medications on file prior to visit.        ROS:  All others reviewed and negative.  Objective        PE:  BP 122/78 (BP Location: Right Arm, Patient Position: Sitting, Cuff Size: Normal)   Pulse (!) 56   Temp 98.2 F (36.8 C) (Oral)   Ht 6\' 4"  (1.93 m)   Wt 293 lb (132.9 kg)   SpO2 96%   BMI 35.67 kg/m                 Constitutional: Pt appears in NAD               HENT: Head: NCAT.                Right Ear: External ear normal.                 Left Ear: External ear normal.                Eyes: . Pupils are equal, round, and reactive to light. Conjunctivae and EOM are normal               Nose: without d/c or deformity               Neck: Neck supple. Gross normal ROM               Cardiovascular: Normal rate and regular rhythm.                 Pulmonary/Chest: Effort normal and breath sounds without rales or wheezing.                Abd:  Soft, NT, ND, + BS, no organomegaly  Neurological: Pt is alert. At baseline orientation, motor grossly intact               Skin: Skin is warm. No rashes, no other new lesions, LE edema - none;   left knee with degenerative changes , trace effusion, mild warmth and pain with ROM testing lying down               Psychiatric: Pt behavior is normal without agitation   Micro: none  Cardiac tracings I have personally interpreted today:   none  Pertinent Radiological findings (summarize): none   Lab Results  Component Value Date   WBC 9.0 07/01/2022   HGB 17.0 07/01/2022   HCT 50.2 07/01/2022   PLT 223.0 07/01/2022   GLUCOSE 118 (H) 07/01/2022   CHOL 114 07/01/2022   TRIG 111.0 07/01/2022   HDL 35.40 (L) 07/01/2022   LDLDIRECT 148.7 04/12/2008   LDLCALC 56 07/01/2022   ALT 28 07/01/2022   AST 19 07/01/2022   NA 139 07/01/2022   K 4.3 07/01/2022   CL 105 07/01/2022   CREATININE 1.30 07/01/2022   BUN 17 07/01/2022   CO2 23 07/01/2022   TSH 2.21 07/01/2022   PSA 0.97 07/01/2022   HGBA1C 6.1 07/01/2022   MICROALBUR 0.9 07/01/2022   Assessment/Plan:  NORM WRAY is a 78 y.o. White or Caucasian [1] male with  has a past medical history of Abdominal hernia, Allergic rhinitis, Anxiety, Aortic atherosclerosis (HCC) (04/25/2018), Benign neoplasm of stomach, BPH (benign prostatic hypertrophy), C6 cervical fracture (HCC), Cancer (HCC) (2000), Chronic sinusitis, CKD (chronic kidney disease), stage III (HCC), Diverticulitis, Diverticulosis (03/27/2015), Dizziness, ED (erectile dysfunction), GERD (gastroesophageal reflux disease), History of herpes genitalis, History of kidney stones (04/25/2018), HLD (hyperlipidemia), HTN (hypertension), Hydronephrosis, Inguinal hernia (04/25/2018), Low back pain, Migraine, Neck pain, bilateral, Obesity, Polycythemia (2000), Rotator cuff tear, Sleep apnea, and Tubular adenoma of colon (05/2008).  Elevated coronary artery calcium  score Also for CT Cardiac Score follow up  HYPERCHOLESTEROLEMIA Lab Results  Component Value Date   LDLCALC 56 07/01/2022   Stable, pt to continue current statin lipitor 40 mg qd   Essential hypertension BP Readings from Last 3 Encounters:  07/11/23 122/78  03/29/23 118/84  02/03/23 132/84   Stable, pt to continue medical treatment toprol  xl 50 qd   Hyperglycemia Lab Results  Component Value Date   HGBA1C 6.1 07/01/2022   Stable, pt to continue  current medical treatment  - diet, wt control   CKD (chronic kidney disease) stage 3, GFR 30-59 ml/min (HCC) Lab Results  Component Value Date   CREATININE 1.30 07/01/2022   Stable overall, cont to avoid nephrotoxins   Vitamin D  deficiency Last vitamin D  Lab Results  Component Value Date   VD25OH 25.50 (L) 07/01/2022   Low, to start oral replacement   B12 deficiency Lab Results  Component Value Date   VITAMINB12 277 07/01/2022   Low, to start oral replacement - b12 1000 mcg qd  Followup: Return in about 6 months (around 01/10/2024).  Rosalia Colonel, MD 07/11/2023 8:32 PM [facility]

## 2023-07-19 DIAGNOSIS — Z961 Presence of intraocular lens: Secondary | ICD-10-CM | POA: Diagnosis not present

## 2023-07-19 DIAGNOSIS — H269 Unspecified cataract: Secondary | ICD-10-CM | POA: Diagnosis not present

## 2023-07-19 DIAGNOSIS — Z8669 Personal history of other diseases of the nervous system and sense organs: Secondary | ICD-10-CM | POA: Diagnosis not present

## 2023-07-19 DIAGNOSIS — H35351 Cystoid macular degeneration, right eye: Secondary | ICD-10-CM | POA: Diagnosis not present

## 2023-07-19 DIAGNOSIS — H35372 Puckering of macula, left eye: Secondary | ICD-10-CM | POA: Diagnosis not present

## 2023-07-19 DIAGNOSIS — H3521 Other non-diabetic proliferative retinopathy, right eye: Secondary | ICD-10-CM | POA: Diagnosis not present

## 2023-07-21 ENCOUNTER — Ambulatory Visit: Admitting: Orthopedic Surgery

## 2023-07-21 ENCOUNTER — Encounter: Payer: Self-pay | Admitting: Orthopedic Surgery

## 2023-07-21 DIAGNOSIS — M170 Bilateral primary osteoarthritis of knee: Secondary | ICD-10-CM | POA: Diagnosis not present

## 2023-07-21 NOTE — Progress Notes (Signed)
 Office Visit Note   Patient: Antonio Reyes           Date of Birth: 02/22/1946           MRN: [id number] Visit Date: 07/21/2023              Requested by: Roslyn Coombe, MD [address] [place],  [place] [address] PCP: Roslyn Coombe, MD  Chief Complaint  Patient presents with   Right Knee - Pain   Left Knee - Pain      HPI: Patient is a 78 year old gentleman who presents in follow-up for osteoarthritis both knees left worse than right.  Patient states the pain is getting worse he has decreased relief with the steroid injection.  Patient states he has sporadic swelling of both knees and decreasing activities of daily living.  Assessment & Plan: Visit Diagnoses:  1. Bilateral primary osteoarthritis of knee     Plan: Patient wishes to proceed with a left total knee arthroplasty.  Discussed this would be outpatient surgery.  Risks and benefits were discussed including infection neurovascular injury fracture or tendon rupture.  Patient states he understands wished to proceed at this time.  Follow-Up Instructions: Return if symptoms worsen or fail to improve.   Ortho Exam  Patient is alert, oriented, no adenopathy, well-dressed, normal affect, normal respiratory effort. Examination of the left knee there is a mild effusion there is no cellulitis.  Patient has crepitation with range of motion of the patellofemoral joint.  Collaterals and cruciates are stable.  Range of motion 10 to 110 degrees with lacking full extension.  Imaging: No results found. No images are attached to the encounter.  Labs: Lab Results  Component Value Date   HGBA1C 6.1 07/01/2022   HGBA1C 6.0 05/20/2021   HGBA1C 6.0 11/12/2020     Lab Results  Component Value Date   ALBUMIN 4.2 07/01/2022   ALBUMIN 4.3 12/29/2021   ALBUMIN 4.4 05/20/2021    No results found for: "MG" Lab Results  Component Value Date   VD25OH 25.50 (L) 07/01/2022   VD25OH 19.53 (L) 05/20/2021   VD25OH 26.75 (L)  11/12/2020    No results found for: "PREALBUMIN"    Latest Ref Rng & Units 07/01/2022    9:37 AM 12/29/2021    4:04 PM 05/20/2021    9:13 AM  CBC EXTENDED  WBC 4.0 - 10.5 K/uL 9.0  8.1  6.4   RBC 4.22 - 5.81 Mil/uL 5.50  5.61  5.42   Hemoglobin 13.0 - 17.0 g/dL 46.9  62.9  52.8   HCT 39.0 - 52.0 % 50.2  51.0  49.9   Platelets 150.0 - 400.0 K/uL 223.0  219.0  194.0   NEUT# 1.4 - 7.7 K/uL 6.0  5.0  3.9   Lymph# 0.7 - 4.0 K/uL 2.0  2.1  1.9      There is no height or weight on file to calculate BMI.  Orders:  No orders of the defined types were placed in this encounter.  No orders of the defined types were placed in this encounter.    Procedures: No procedures performed  Clinical Data: No additional findings.  ROS:  All other systems negative, except as noted in the HPI. Review of Systems  Objective: Vital Signs: There were no vitals taken for this visit.  Specialty Comments:  No specialty comments available.  PMFS History: Patient Active Problem List   Diagnosis Date Noted   B12 deficiency 07/11/2023  Bilateral primary osteoarthritis of knee 03/23/2022   Achilles tendinitis 03/23/2022   Bilateral leg pain 05/20/2021   History of colonic polyps 05/20/2021   Acute sinusitis 02/19/2021   Lower abdominal pain 12/06/2020   Elevated coronary artery calcium  score 12/02/2020   Smoker 11/16/2020   Vitamin D  deficiency 05/09/2020   Laceration of digital nerve of left thumb 01/31/2020   Bilateral posterior neck pain 05/09/2018   Microhematuria 04/24/2018   CKD (chronic kidney disease) stage 3, GFR 30-59 ml/min (HCC) 04/24/2018   Dysuria 04/21/2018   Allergic rhinitis 09/20/2017   Nonallopathic lesion of sacral region 08/04/2017   Nonallopathic lesion of lumbosacral region 08/04/2017   Nonallopathic lesion of thoracic region 08/04/2017   Bilateral foot pain 04/22/2017   Left leg pain 04/22/2017   Bilateral groin pain 04/22/2017   Metatarsalgia 11/11/2016    Hallux rigidus 06/11/2016   Contusion of left knee 06/11/2016   Hyperglycemia 04/21/2016   Chest pain 06/05/2015   Polycythemia, secondary 02/09/2013   Dizziness 06/08/2011   Chronic sinusitis 06/08/2011   Ankle pain 08/18/2010   Morbid obesity (HCC) 04/20/2010   ABRASION, ARM 04/20/2010   CHEST PAIN 07/22/2009   Genital herpes 04/12/2008   Anxiety state 04/12/2008   Migraine without aura 04/12/2008   BPH associated with nocturia 04/12/2008   History of colonic polyps 04/12/2008   LOW BACK PAIN 11/26/2006   HYPERCHOLESTEROLEMIA 11/17/2006   ERECTILE DYSFUNCTION 11/17/2006   Essential hypertension 11/17/2006   GERD 11/17/2006   OSA (obstructive sleep apnea) 11/17/2006   DIVERTICULITIS, HX OF 11/17/2006   Past Medical History:  Diagnosis Date   Abdominal hernia    Allergic rhinitis    Anxiety    pt states no history of anxiety   Aortic atherosclerosis (HCC) 04/25/2018   noted on CT renal   Benign neoplasm of stomach    BPH (benign prostatic hypertrophy)    pt unaware   C6 cervical fracture (HCC)    C7,C8   Cancer (HCC) 2000   skin cancer   left ear   Chronic sinusitis    CKD (chronic kidney disease), stage III (HCC)    pt unaware   Diverticulitis    Diverticulosis 03/27/2015   Moderate, Noted on colonoscopy   Dizziness    ED (erectile dysfunction)    GERD (gastroesophageal reflux disease)    History of herpes genitalis    History of kidney stones 04/25/2018   4 mm left UVJ stone, 10 mm bladder stone, noted on CT renal   HLD (hyperlipidemia)    HTN (hypertension)    Hydronephrosis    Left, Moderate, noted on CT renal   Inguinal hernia 04/25/2018   Bilateral, noted on CT renal, pt unaware   Low back pain    Migraine    hx of   Neck pain, bilateral    Obesity    Polycythemia 2000   per Dr. Villa Greaser   Rotator cuff tear    Sleep apnea    CPAP   Tubular adenoma of colon 05/2008    Family History  Problem Relation Age of Onset   Colon cancer Mother 61    Pancreatic cancer Paternal Grandfather    Colon polyps Neg Hx    Esophageal cancer Neg Hx    Stomach cancer Neg Hx    Rectal cancer Neg Hx     Past Surgical History:  Procedure Laterality Date   COLONOSCOPY  03/27/2015   CYSTOSCOPY WITH RETROGRADE PYELOGRAM, URETEROSCOPY AND STENT PLACEMENT Left 05/31/2018  Procedure: CYSTOSCOPY WITH RETROGRADE PYELOGRAM, URETEROSCOPY AND STENT PLACEMENT;  Surgeon: Osborn Blaze, MD;  Location: [facility];  Service: Urology;  Laterality: Left;  1 HR   HERNIA REPAIR     umbilical with mesh   HOLMIUM LASER APPLICATION Left 05/31/2018   Procedure: HOLMIUM LASER APPLICATION;  Surgeon: Osborn Blaze, MD;  Location: [facility];  Service: Urology;  Laterality: Left;   KNEE SURGERY Right    mensicus   2011   POLYPECTOMY     UPPER GI ENDOSCOPY  06/16/2005   viterectomy Right 11/2022   on Right eye   Social History   Occupational History   Occupation: Airline pilot and marketing Surveyor, minerals)   Occupation: Retired  Tobacco Use   Smoking status: Light Smoker    Types: Cigars   Smokeless tobacco: Never   Tobacco comments:    occasionally  every 3 months  Vaping Use   Vaping status: Never Used  Substance and Sexual Activity   Alcohol use: Yes    Alcohol/week: 1.0 standard drink of alcohol    Types: 1 Standard drinks or equivalent per week    Comment: rare   Drug use: No   Sexual activity: Yes

## 2023-07-22 ENCOUNTER — Telehealth: Payer: Self-pay | Admitting: Orthopedic Surgery

## 2023-07-22 NOTE — Telephone Encounter (Signed)
 I have a surgery sheet to schedule Antonio Reyes for a left total knee replacement.  I called and left a message for him to please return my call to discuss scheduling.

## 2023-08-18 ENCOUNTER — Other Ambulatory Visit: Payer: Self-pay | Admitting: Internal Medicine

## 2023-08-18 ENCOUNTER — Encounter: Payer: Self-pay | Admitting: Internal Medicine

## 2023-08-18 ENCOUNTER — Ambulatory Visit (HOSPITAL_BASED_OUTPATIENT_CLINIC_OR_DEPARTMENT_OTHER)
Admission: RE | Admit: 2023-08-18 | Discharge: 2023-08-18 | Disposition: A | Discharge status: Home / Self Care | Payer: Self-pay | Source: Ambulatory Visit | Attending: Internal Medicine | Admitting: Internal Medicine

## 2023-08-18 ENCOUNTER — Ambulatory Visit: Payer: Self-pay | Admitting: Internal Medicine

## 2023-08-18 DIAGNOSIS — R931 Abnormal findings on diagnostic imaging of heart and coronary circulation: Secondary | ICD-10-CM

## 2023-08-18 DIAGNOSIS — I10 Essential (primary) hypertension: Secondary | ICD-10-CM | POA: Insufficient documentation

## 2023-08-18 DIAGNOSIS — R739 Hyperglycemia, unspecified: Secondary | ICD-10-CM | POA: Insufficient documentation

## 2023-08-18 DIAGNOSIS — E7800 Pure hypercholesterolemia, unspecified: Secondary | ICD-10-CM | POA: Insufficient documentation

## 2023-09-14 NOTE — Telephone Encounter (Signed)
 Called and spoke with patient on this matter as well. Patient expressed understanding and will be following up with their cardiologist

## 2023-09-15 DIAGNOSIS — D225 Melanocytic nevi of trunk: Secondary | ICD-10-CM | POA: Diagnosis not present

## 2023-09-15 DIAGNOSIS — L821 Other seborrheic keratosis: Secondary | ICD-10-CM | POA: Diagnosis not present

## 2023-09-15 DIAGNOSIS — L814 Other melanin hyperpigmentation: Secondary | ICD-10-CM | POA: Diagnosis not present

## 2023-09-15 DIAGNOSIS — L570 Actinic keratosis: Secondary | ICD-10-CM | POA: Diagnosis not present

## 2023-10-04 ENCOUNTER — Encounter: Payer: Self-pay | Admitting: Cardiology

## 2023-10-04 ENCOUNTER — Encounter: Payer: Self-pay | Admitting: Internal Medicine

## 2023-10-04 ENCOUNTER — Ambulatory Visit: Attending: Cardiology | Admitting: Cardiology

## 2023-10-04 VITALS — BP 102/64 | HR 63 | Ht 76.0 in | Wt 274.6 lb

## 2023-10-04 DIAGNOSIS — E7800 Pure hypercholesterolemia, unspecified: Secondary | ICD-10-CM | POA: Insufficient documentation

## 2023-10-04 DIAGNOSIS — R739 Hyperglycemia, unspecified: Secondary | ICD-10-CM

## 2023-10-04 DIAGNOSIS — E538 Deficiency of other specified B group vitamins: Secondary | ICD-10-CM

## 2023-10-04 DIAGNOSIS — I10 Essential (primary) hypertension: Secondary | ICD-10-CM | POA: Diagnosis present

## 2023-10-04 DIAGNOSIS — N320 Bladder-neck obstruction: Secondary | ICD-10-CM

## 2023-10-04 DIAGNOSIS — R931 Abnormal findings on diagnostic imaging of heart and coronary circulation: Secondary | ICD-10-CM | POA: Diagnosis not present

## 2023-10-04 DIAGNOSIS — E559 Vitamin D deficiency, unspecified: Secondary | ICD-10-CM

## 2023-10-04 NOTE — Patient Instructions (Signed)
 Medication Instructions:  Your physician recommends that you continue on your current medications as directed. Please refer to the Current Medication list given to you today.  *If you need a refill on your cardiac medications before your next appointment, please call your pharmacy*  Lab Work: Lipoprotein a in October If you have labs (blood work) drawn today and your tests are completely normal, you will receive your results only by: MyChart Message (if you have MyChart) OR A paper copy in the mail If you have any lab test that is abnormal or we need to change your treatment, we will call you to review the results.  Testing/Procedures: NONE  Follow-Up: At [facility], you and your health needs are our priority.  As part of our continuing mission to provide you with exceptional heart care, our providers are all part of one team.  This team includes your primary Cardiologist (physician) and Advanced Practice Providers or APPs (Physician Assistants and Nurse Practitioners) who all work together to provide you with the care you need, when you need it.  Your next appointment:   As needed  Provider:   Lavona, MD   We recommend signing up for the patient portal called MyChart.  Sign up information is provided on this After Visit Summary.  MyChart is used to connect with patients for Virtual Visits (Telemedicine).  Patients are able to view lab/test results, encounter notes, upcoming appointments, etc.  Non-urgent messages can be sent to your provider as well.   To learn more about what you can do with MyChart, go to [email or web address].   Other Instructions NONE

## 2023-10-04 NOTE — Progress Notes (Addendum)
  Cardiology Office Note:   Date:  10/04/2023  ID:  Antonio Reyes, DOB 1945-11-16, MRN [id number] PCP: Norleen Lynwood ORN, MD  [facility] Providers Cardiologist:  Alvan Ronal BRAVO, MD (Inactive) {  History of Present Illness:   Antonio Reyes is a 78 y.o. male the patient who had been seen by Dr. Ronal Alvan for evaluation of a calcium  score.  He had a Lexiscan  in 2012 when I saw him.   He had a calcium  score recently and was noted to have a lower score than previous.  It was 330 which was 56th percentile.    He is active.  He plays pickle ball.  He played that this morning. The patient denies any new symptoms such as chest discomfort, neck or arm discomfort. There has been no new shortness of breath, PND or orthopnea. There have been no reported palpitations, presyncope or syncope.   ROS: As stated in the HPI and negative for all other systems.  Studies Reviewed:    EKG:   EKG Interpretation Date/Time:  Tuesday October 04 2023 13:52:25 EDT Ventricular Rate:  59 PR Interval:  198 QRS Duration:  98 QT Interval:  404 QTC Calculation: 399 R Axis:   -5  Text Interpretation: Sinus bradycardia When compared with ECG of 15-May-2018 09:43, No significant change was found Confirmed by Lavona Lynwood ([id number]) on 10/04/2023 2:01:34 PM     Risk Assessment/Calculations:              Physical Exam:   VS:  BP 102/64 (BP Location: Left Arm, Patient Position: Sitting, Cuff Size: Normal)   Pulse 63   Ht 6' 4 (1.93 m)   Wt 274 lb 9.6 oz (124.6 kg)   SpO2 93%   BMI 33.43 kg/m    Wt Readings from Last 3 Encounters:  10/04/23 274 lb 9.6 oz (124.6 kg)  07/11/23 293 lb (132.9 kg)  03/29/23 294 lb (133.4 kg)     GEN: Well nourished, well developed in no acute distress NECK: No JVD; No carotid bruits CARDIAC: RRR, no murmurs, rubs, gallops RESPIRATORY:  Clear to auscultation without rales, wheezing or rhonchi  ABDOMEN: Soft, non-tender, non-distended EXTREMITIES:  No edema; No deformity    ASSESSMENT AND PLAN:   Elevated coronary calcium : The patient's not having any symptoms.  He is very physically active.  He is participating in risk reduction.  At this point no further cardiovascular testing is suggested.  We did talk about symptoms that should cause him to call us  back or present to the hospital.  HTN: Blood pressure is well-controlled.  No change in therapy.  Risk reduction: I am going to add an LP(a) to his next blood draw which she says is going to be in October after he sees his primary provider.  Lung nodules:  H did have some smalle lung nodules that were likely benign on the study ordered by Dr. Norleen.  I have asked him to message Dr. Norleen to make sure they do not want further imaging.     Follow up with me as needed  Signed, Lynwood Lavona, MD

## 2023-10-07 ENCOUNTER — Telehealth: Admitting: Physician Assistant

## 2023-10-07 ENCOUNTER — Ambulatory Visit: Payer: Self-pay

## 2023-10-07 DIAGNOSIS — J0110 Acute frontal sinusitis, unspecified: Secondary | ICD-10-CM

## 2023-10-07 MED ORDER — METHYLPREDNISOLONE 4 MG PO TBPK: ORAL_TABLET | ORAL | 0 refills | Status: DC

## 2023-10-07 MED ORDER — AMOXICILLIN-POT CLAVULANATE 875-125 MG PO TABS: 1.0000 | ORAL_TABLET | Freq: Two times a day (BID) | ORAL | 0 refills | Status: AC

## 2023-10-07 NOTE — Telephone Encounter (Signed)
 FYI Only or Action Required?: FYI only for provider.  Patient was last seen in primary care on 07/11/2023 by Norleen Lynwood ORN, MD.  Called Nurse Triage reporting No chief complaint on file..  Symptoms began a week ago.  Interventions attempted: sudafed and other otc med.  Symptoms are: gradually worsening.  Triage Disposition: See PCP When Office is Open (Within 3 Days)  Patient/caregiver understands and will follow disposition?: Yes        Copied from CRM [id number]. Topic: Clinical - Red Word Triage >> Oct 07, 2023  8:24 AM Franky GRADE wrote: Red Word that prompted transfer to Nurse Triage: Patient is experiencing sinus infection symptoms for about a week now, patient has taken over the counter medication but symptoms don't seem to be improving. Reason for Disposition  [1] Using nasal washes and pain medicine > 24 hours AND [2] sinus pain (lower forehead, cheekbone, or eye) persists  Answer Assessment - Initial Assessment Questions 1. ONSET: When did the nasal discharge start?      Friday 2. AMOUNT: How much discharge is there?      Minimal  3. COUGH: Do you have a cough? If Yes, ask: Describe the color of your mucus. (e.g., clear, white, yellow, green)     dry 4. RESPIRATORY DISTRESS: Describe your breathing.      no 5. FEVER: Do you have a fever? If Yes, ask: What is your temperature, how was it measured, and when did it start?     No but chills 6. SEVERITY: Overall, how bad are you feeling right now? (e.g., doesn't interfere with normal activities, staying home from school/work, staying in bed)      mod 7. OTHER SYMPTOMS: Do you have any other symptoms? (e.g., earache, mouth sores, sore throat, wheezing)     Scratchy throat, cough, runny nose, sneezing, nasal congestion, lethargic  Protocols used: Common Cold-A-AH

## 2023-10-07 NOTE — Progress Notes (Signed)
 Virtual Visit Consent   Antonio Reyes, you are scheduled for a virtual visit with a [facility] provider today. Just as with appointments in the office, your consent must be obtained to participate. Your consent will be active for this visit and any virtual visit you may have with one of our providers in the next 365 days. If you have a MyChart account, a copy of this consent can be sent to you electronically.  As this is a virtual visit, video technology does not allow for your provider to perform a traditional examination. This may limit your provider's ability to fully assess your condition. If your provider identifies any concerns that need to be evaluated in person or the need to arrange testing (such as labs, EKG, etc.), we will make arrangements to do so. Although advances in technology are sophisticated, we cannot ensure that it will always work on either your end or our end. If the connection with a video visit is poor, the visit may have to be switched to a telephone visit. With either a video or telephone visit, we are not always able to ensure that we have a secure connection.  By engaging in this virtual visit, you consent to the provision of healthcare and authorize for your insurance to be billed (if applicable) for the services provided during this visit. Depending on your insurance coverage, you may receive a charge related to this service.  I need to obtain your verbal consent now. Are you willing to proceed with your visit today? Antonio Reyes has provided verbal consent on 10/07/2023 for a virtual visit (video or telephone). Antonio Reyes, [place]  Date: 10/07/2023 9:42 AM   Virtual Visit via Video Note   I, Antonio Reyes, connected with  Antonio Reyes  ([id number], 1946-02-23) on 10/07/23 at  9:45 AM EDT by a video-enabled telemedicine application and verified that I am speaking with the correct person using two identifiers.  Location: Patient: Virtual Visit Location Patient:  Home Provider: Virtual Visit Location Provider: Home Office   I discussed the limitations of evaluation and management by telemedicine and the availability of in person appointments. The patient expressed understanding and agreed to proceed.    History of Present Illness: Antonio Reyes is a 78 y.o. who identifies as a male who was assigned male at birth, and is being seen today for sinus issues.  HPI: Sinusitis This is a new problem. The current episode started today. There has been no fever. Associated symptoms include congestion, sinus pressure and a sore throat. Past treatments include oral decongestants and acetaminophen . The treatment provided no relief.    Problems:  Patient Active Problem List   Diagnosis Date Noted   B12 deficiency 07/11/2023   Bilateral primary osteoarthritis of knee 03/23/2022   Achilles tendinitis 03/23/2022   Bilateral leg pain 05/20/2021   History of colonic polyps 05/20/2021   Acute sinusitis 02/19/2021   Lower abdominal pain 12/06/2020   Elevated coronary artery calcium  score 12/02/2020   Smoker 11/16/2020   Vitamin D  deficiency 05/09/2020   Laceration of digital nerve of left thumb 01/31/2020   Bilateral posterior neck pain 05/09/2018   Microhematuria 04/24/2018   CKD (chronic kidney disease) stage 3, GFR 30-59 ml/min (HCC) 04/24/2018   Dysuria 04/21/2018   Allergic rhinitis 09/20/2017   Nonallopathic lesion of sacral region 08/04/2017   Nonallopathic lesion of lumbosacral region 08/04/2017   Nonallopathic lesion of thoracic region 08/04/2017   Bilateral foot pain 04/22/2017   Left leg  pain 04/22/2017   Bilateral groin pain 04/22/2017   Metatarsalgia 11/11/2016   Hallux rigidus 06/11/2016   Contusion of left knee 06/11/2016   Hyperglycemia 04/21/2016   Chest pain 06/05/2015   Polycythemia, secondary 02/09/2013   Dizziness 06/08/2011   Chronic sinusitis 06/08/2011   Ankle pain 08/18/2010   Morbid obesity (HCC) 04/20/2010   ABRASION, ARM  04/20/2010   CHEST PAIN 07/22/2009   Genital herpes 04/12/2008   Anxiety state 04/12/2008   Migraine without aura 04/12/2008   BPH associated with nocturia 04/12/2008   History of colonic polyps 04/12/2008   LOW BACK PAIN 11/26/2006   HYPERCHOLESTEROLEMIA 11/17/2006   ERECTILE DYSFUNCTION 11/17/2006   Essential hypertension 11/17/2006   GERD 11/17/2006   OSA (obstructive sleep apnea) 11/17/2006   DIVERTICULITIS, HX OF 11/17/2006    Allergies:  Allergies  Allergen Reactions   Olive Tree Other (See Comments)   Pollen Extract     Other reaction(s): Other (See Comments)   Medications:  Current Outpatient Medications:    acetaZOLAMIDE (DIAMOX) 250 MG tablet, Take by mouth. (Patient not taking: Reported on 10/04/2023), Disp: , Rfl:    aspirin 81 MG tablet, Take 81 mg by mouth daily., Disp: , Rfl:    atorvastatin  (LIPITOR) 40 MG tablet, TAKE 1 TABLET EVERY DAY, Disp: 90 tablet, Rfl: 3   atropine 1 % ophthalmic solution, Place into the right eye. (Patient not taking: Reported on 10/04/2023), Disp: , Rfl:    Difluprednate 0.05 % EMUL, Place 1 drop into the right eye 4 (four) times daily. (Patient not taking: Reported on 10/04/2023), Disp: , Rfl:    doxycycline  (VIBRA -TABS) 100 MG tablet, Take 1 tablet (100 mg total) by mouth 2 (two) times daily. (Patient not taking: Reported on 10/04/2023), Disp: 14 tablet, Rfl: 0   erythromycin ophthalmic ointment, SMARTSIG:Right Eye Every Evening (Patient not taking: Reported on 10/04/2023), Disp: , Rfl:    fluticasone  (FLONASE ) 50 MCG/ACT nasal spray, USE 2 SPRAYS IN EACH NOSTRIL EVERY DAY (SUBSTITUTED FOR FLONASE ) (NEED MD APPOINTMENT FOR REFILLS), Disp: 48 g, Rfl: 3   ibuprofen (ADVIL,MOTRIN) 200 MG tablet, Take 200 mg by mouth every 6 (six) hours as needed for headache or moderate pain., Disp: , Rfl:    metoprolol  succinate (TOPROL -XL) 50 MG 24 hr tablet, TAKE 1 TABLET EVERY DAY, Disp: 90 tablet, Rfl: 3   montelukast  (SINGULAIR ) 10 MG tablet, TAKE 1 TABLET  EVERY DAY, Disp: 90 tablet, Rfl: 3   ofloxacin (OCUFLOX) 0.3 % ophthalmic solution, PLEASE SEE ATTACHED FOR DETAILED DIRECTIONS (Patient not taking: Reported on 10/04/2023), Disp: , Rfl:    omeprazole  (PRILOSEC) 20 MG capsule, Take 1 capsule (20 mg total) by mouth daily., Disp: 30 capsule, Rfl: 11   prednisoLONE acetate (PRED FORTE) 1 % ophthalmic suspension, SMARTSIG:In Eye(s) (Patient not taking: Reported on 10/04/2023), Disp: , Rfl:    tadalafil  (CIALIS ) 5 MG tablet, Take 1 tablet (5 mg total) by mouth daily., Disp: 90 tablet, Rfl: 3  Observations/Objective: Patient is well-developed, well-nourished in no acute distress.  Resting comfortably  at home.  Head is normocephalic, atraumatic.  No labored breathing.  Speech is clear and coherent with logical content.  Patient is alert and oriented at baseline.    Assessment and Plan: 1. Acute non-recurrent frontal sinusitis (Primary)  Presentation consistent with sinusitis.  No evidence of other bacterial infections including pneumonia, meningitis, pharyngitis, otitis media.  Discussed that this fits picture of  bacterial sinusitis and that due to type and duration of symptoms and exam findings,  we will treat as bacterial sinusitis.  Antibiotics prescribed. Advised to continue ibuprofen and Tylenol  at home. Patient is to follow up with primary physician if having continued symptoms.   Advised patient on supportive therapies, including using a cool-mist vaporizer/humidifier/steam from hot showers, OTC throat lozenges, advancement of fluids as tolerated, nasal saline sprays, rest, OTC acetaminophen  or ibuprofen for pain control, frequent handwashing.  Follow Up Instructions: I discussed the assessment and treatment plan with the patient. The patient was provided an opportunity to ask questions and all were answered. The patient agreed with the plan and demonstrated an understanding of the instructions.  A copy of instructions were sent to the patient  via MyChart unless otherwise noted below.    The patient was advised to call back or seek an in-person evaluation if the symptoms worsen or if the condition fails to improve as anticipated.    Antonio Shuck, PA-C

## 2023-10-07 NOTE — Patient Instructions (Signed)
 Antonio Reyes, thank you for joining Teena Shuck, PA-C for today's virtual visit.  While this provider is not your primary care provider (PCP), if your PCP is located in our provider database this encounter information will be shared with them immediately following your visit.   A [facility] MyChart account gives you access to today's visit and all your visits, tests, and labs performed at [facility]  click here if you don't have a [facility] MyChart account or go to [email or web address]  Consent: (Patient) Antonio Reyes provided verbal consent for this virtual visit at the beginning of the encounter.  Current Medications:  Current Outpatient Medications:    acetaZOLAMIDE (DIAMOX) 250 MG tablet, Take by mouth. (Patient not taking: Reported on 10/04/2023), Disp: , Rfl:    aspirin 81 MG tablet, Take 81 mg by mouth daily., Disp: , Rfl:    atorvastatin  (LIPITOR) 40 MG tablet, TAKE 1 TABLET EVERY DAY, Disp: 90 tablet, Rfl: 3   atropine 1 % ophthalmic solution, Place into the right eye. (Patient not taking: Reported on 10/04/2023), Disp: , Rfl:    Difluprednate 0.05 % EMUL, Place 1 drop into the right eye 4 (four) times daily. (Patient not taking: Reported on 10/04/2023), Disp: , Rfl:    doxycycline  (VIBRA -TABS) 100 MG tablet, Take 1 tablet (100 mg total) by mouth 2 (two) times daily. (Patient not taking: Reported on 10/04/2023), Disp: 14 tablet, Rfl: 0   erythromycin ophthalmic ointment, SMARTSIG:Right Eye Every Evening (Patient not taking: Reported on 10/04/2023), Disp: , Rfl:    fluticasone  (FLONASE ) 50 MCG/ACT nasal spray, USE 2 SPRAYS IN EACH NOSTRIL EVERY DAY (SUBSTITUTED FOR FLONASE ) (NEED MD APPOINTMENT FOR REFILLS), Disp: 48 g, Rfl: 3   ibuprofen (ADVIL,MOTRIN) 200 MG tablet, Take 200 mg by mouth every 6 (six) hours as needed for headache or moderate pain., Disp: , Rfl:    metoprolol  succinate (TOPROL -XL) 50 MG 24 hr tablet, TAKE 1 TABLET EVERY DAY, Disp: 90 tablet, Rfl:  3   montelukast  (SINGULAIR ) 10 MG tablet, TAKE 1 TABLET EVERY DAY, Disp: 90 tablet, Rfl: 3   ofloxacin (OCUFLOX) 0.3 % ophthalmic solution, PLEASE SEE ATTACHED FOR DETAILED DIRECTIONS (Patient not taking: Reported on 10/04/2023), Disp: , Rfl:    omeprazole  (PRILOSEC) 20 MG capsule, Take 1 capsule (20 mg total) by mouth daily., Disp: 30 capsule, Rfl: 11   prednisoLONE acetate (PRED FORTE) 1 % ophthalmic suspension, SMARTSIG:In Eye(s) (Patient not taking: Reported on 10/04/2023), Disp: , Rfl:    tadalafil  (CIALIS ) 5 MG tablet, Take 1 tablet (5 mg total) by mouth daily., Disp: 90 tablet, Rfl: 3   Medications ordered in this encounter:  No orders of the defined types were placed in this encounter.    *If you need refills on other medications prior to your next appointment, please contact your pharmacy*  Follow-Up: Call back or seek an in-person evaluation if the symptoms worsen or if the condition fails to improve as anticipated.  [facility] [phone]  Other Instructions Please report to the nearest Emergency room with any worsening symptoms. Follow up with primary care provider (PCP) in 2 -3 days   If you have been instructed to have an in-person evaluation today at a local Urgent Care facility, please use the link below. It will take you to a list of all of our available [facility], including address, phone number and hours of operation. Please do not delay care.  [facility]  If you or  a family member do not have a primary care provider, use the link below to schedule a visit and establish care. When you choose a [facility] primary care physician or advanced practice provider, you gain a long-term partner in health. Find a Primary Care Provider  Learn more about [facility]'s in-office and virtual care options: [facility] - Get Care Now

## 2023-10-18 DIAGNOSIS — Z961 Presence of intraocular lens: Secondary | ICD-10-CM | POA: Diagnosis not present

## 2023-10-18 DIAGNOSIS — H35351 Cystoid macular degeneration, right eye: Secondary | ICD-10-CM | POA: Diagnosis not present

## 2023-10-18 DIAGNOSIS — H3521 Other non-diabetic proliferative retinopathy, right eye: Secondary | ICD-10-CM | POA: Diagnosis not present

## 2023-10-18 DIAGNOSIS — H35372 Puckering of macula, left eye: Secondary | ICD-10-CM | POA: Diagnosis not present

## 2023-10-18 DIAGNOSIS — H2512 Age-related nuclear cataract, left eye: Secondary | ICD-10-CM | POA: Diagnosis not present

## 2023-10-24 ENCOUNTER — Ambulatory Visit (HOSPITAL_BASED_OUTPATIENT_CLINIC_OR_DEPARTMENT_OTHER): Admitting: Nurse Practitioner

## 2023-11-07 ENCOUNTER — Ambulatory Visit (INDEPENDENT_AMBULATORY_CARE_PROVIDER_SITE_OTHER): Admitting: Internal Medicine

## 2023-11-07 ENCOUNTER — Encounter: Payer: Self-pay | Admitting: Internal Medicine

## 2023-11-07 VITALS — BP 122/74 | HR 62 | Temp 97.6°F | Ht 76.0 in | Wt 275.0 lb

## 2023-11-07 DIAGNOSIS — F17200 Nicotine dependence, unspecified, uncomplicated: Secondary | ICD-10-CM

## 2023-11-07 DIAGNOSIS — E559 Vitamin D deficiency, unspecified: Secondary | ICD-10-CM | POA: Diagnosis not present

## 2023-11-07 DIAGNOSIS — J309 Allergic rhinitis, unspecified: Secondary | ICD-10-CM

## 2023-11-07 DIAGNOSIS — J069 Acute upper respiratory infection, unspecified: Secondary | ICD-10-CM | POA: Insufficient documentation

## 2023-11-07 DIAGNOSIS — R739 Hyperglycemia, unspecified: Secondary | ICD-10-CM | POA: Diagnosis not present

## 2023-11-07 DIAGNOSIS — I10 Essential (primary) hypertension: Secondary | ICD-10-CM

## 2023-11-07 MED ORDER — METHYLPREDNISOLONE 4 MG PO TBPK: ORAL_TABLET | ORAL | 0 refills | Status: DC

## 2023-11-07 MED ORDER — DOXYCYCLINE HYCLATE 100 MG PO TABS: 100.0000 mg | ORAL_TABLET | Freq: Two times a day (BID) | ORAL | 0 refills | Status: DC

## 2023-11-07 NOTE — Assessment & Plan Note (Signed)
Mild to mod, for medrol pack asd,  to f/u any worsening symptoms or concerns 

## 2023-11-07 NOTE — Assessment & Plan Note (Signed)
 Mild to mod, for antibx course doxycycline 100 bid,  to f/u any worsening symptoms or concerns

## 2023-11-07 NOTE — Assessment & Plan Note (Signed)
Last vitamin D Lab Results  Component Value Date   VD25OH 25.50 (L) 07/01/2022   Low, to start oral replacement

## 2023-11-07 NOTE — Assessment & Plan Note (Signed)
 BP Readings from Last 3 Encounters:  11/07/23 122/74  10/04/23 102/64  07/11/23 122/78   Stable, pt to continue medical treatment toprol  xl 50 qd

## 2023-11-07 NOTE — Assessment & Plan Note (Signed)
 Pt counlsed to quit, pt not ready

## 2023-11-07 NOTE — Progress Notes (Signed)
 Patient ID: Antonio Reyes, male   DOB: 1945-06-05, 78 y.o.   MRN: [id number]        Chief Complaint: follow up acute upper resp infection, low vit d, smoker, hyperglycemia, htn       HPI:  Antonio Reyes is a 78 y.o. male  Here with 2-3 days acute onset fever, severe ST pain, pressure, headache, general weakness and malaise, and greenish d/c, with mild ST and cough, but pt denies chest pain, wheezing, increased sob or doe, orthopnea, PND, increased LE swelling, palpitations, dizziness or syncope.   Pt denies polydipsia, polyuria, or new focal neuro s/s.    Pt denies recent wt loss, night sweats, loss of appetite, or other constitutional symptoms Does have several wks ongoing nasal allergy symptoms with clearish congestion, itch and sneezing, without fever, pain, ST, cough, swelling or wheezing.       Wt Readings from Last 3 Encounters:  11/07/23 275 lb (124.7 kg)  10/04/23 274 lb 9.6 oz (124.6 kg)  07/11/23 293 lb (132.9 kg)   BP Readings from Last 3 Encounters:  11/07/23 122/74  10/04/23 102/64  07/11/23 122/78         Past Medical History:  Diagnosis Date   Abdominal hernia    Allergic rhinitis    Anxiety    pt states no history of anxiety   Aortic atherosclerosis (HCC) 04/25/2018   noted on CT renal   Benign neoplasm of stomach    BPH (benign prostatic hypertrophy)    pt unaware   C6 cervical fracture (HCC)    C7,C8   Cancer (HCC) 2000   skin cancer   left ear   Chronic sinusitis    CKD (chronic kidney disease), stage III (HCC)    pt unaware   Diverticulitis    Diverticulosis 03/27/2015   Moderate, Noted on colonoscopy   ED (erectile dysfunction)    GERD (gastroesophageal reflux disease)    History of herpes genitalis    History of kidney stones 04/25/2018   4 mm left UVJ stone, 10 mm bladder stone, noted on CT renal   HLD (hyperlipidemia)    HTN (hypertension)    Hydronephrosis    Left, Moderate, noted on CT renal   Inguinal hernia 04/25/2018   Bilateral, noted  on CT renal, pt unaware   Low back pain    Migraine    hx of   Neck pain, bilateral    Obesity    Polycythemia 2000   per Dr. Jude   Rotator cuff tear    Sleep apnea    CPAP   Tubular adenoma of colon 05/2008   Past Surgical History:  Procedure Laterality Date   COLONOSCOPY  03/27/2015   CYSTOSCOPY WITH RETROGRADE PYELOGRAM, URETEROSCOPY AND STENT PLACEMENT Left 05/31/2018   Procedure: CYSTOSCOPY WITH RETROGRADE PYELOGRAM, URETEROSCOPY AND STENT PLACEMENT;  Surgeon: Alvaro Hummer, MD;  Location: [facility];  Service: Urology;  Laterality: Left;  1 HR   HERNIA REPAIR     umbilical with mesh   HOLMIUM LASER APPLICATION Left 05/31/2018   Procedure: HOLMIUM LASER APPLICATION;  Surgeon: Alvaro Hummer, MD;  Location: [facility];  Service: Urology;  Laterality: Left;   KNEE SURGERY Right    mensicus   2011   POLYPECTOMY     UPPER GI ENDOSCOPY  06/16/2005   viterectomy Right 11/2022   on Right eye    reports that he has been smoking cigars. He has never used smokeless tobacco. He reports current alcohol use  of about 1.0 standard drink of alcohol per week. He reports that he does not use drugs. family history includes Colon cancer (age of onset: 35) in his mother; Pancreatic cancer in his paternal grandfather. Allergies  Allergen Reactions   Olive Tree Other (See Comments)   Pollen Extract     Other reaction(s): Other (See Comments)   Current Outpatient Medications on File Prior to Visit  Medication Sig Dispense Refill   aspirin 81 MG tablet Take 81 mg by mouth daily.     atorvastatin  (LIPITOR) 40 MG tablet TAKE 1 TABLET EVERY DAY 90 tablet 3   fluticasone  (FLONASE ) 50 MCG/ACT nasal spray USE 2 SPRAYS IN EACH NOSTRIL EVERY DAY (SUBSTITUTED FOR FLONASE ) (NEED MD APPOINTMENT FOR REFILLS) 48 g 3   ibuprofen (ADVIL,MOTRIN) 200 MG tablet Take 200 mg by mouth every 6 (six) hours as needed for headache or moderate pain.     metoprolol  succinate (TOPROL -XL) 50 MG 24 hr tablet TAKE 1 TABLET  EVERY DAY 90 tablet 3   montelukast  (SINGULAIR ) 10 MG tablet TAKE 1 TABLET EVERY DAY 90 tablet 3   omeprazole  (PRILOSEC) 20 MG capsule Take 1 capsule (20 mg total) by mouth daily. 30 capsule 11   tadalafil  (CIALIS ) 5 MG tablet Take 1 tablet (5 mg total) by mouth daily. 90 tablet 3   atropine 1 % ophthalmic solution Place into the right eye. (Patient not taking: Reported on 10/04/2023)     No current facility-administered medications on file prior to visit.        ROS:  All others reviewed and negative.  Objective        PE:  BP 122/74   Pulse 62   Temp 97.6 F (36.4 C) (Temporal)   Ht 6' 4 (1.93 m)   Wt 275 lb (124.7 kg)   SpO2 95%   BMI 33.47 kg/m                 Constitutional: Pt appears mild ill               HENT: Head: NCAT.                Right Ear: External ear normal.                 Left Ear: External ear normal. Left TM marked erythema, pharyn with markederythema               Eyes: . Pupils are equal, round, and reactive to light. Conjunctivae and EOM are normal               Nose: without d/c or deformity               Neck: Neck supple. Gross normal ROM               Cardiovascular: Normal rate and regular rhythm.                 Pulmonary/Chest: Effort normal and breath sounds without rales or wheezing.                Abd:  Soft, NT, ND, + BS, no organomegaly               Neurological: Pt is alert. At baseline orientation, motor grossly intact               Skin: Skin is warm. No rashes, no other new lesions, LE edema - none  Psychiatric: Pt behavior is normal without agitation   Micro: none  Cardiac tracings I have personally interpreted today:  none  Pertinent Radiological findings (summarize): none   Lab Results  Component Value Date   WBC 9.0 07/01/2022   HGB 17.0 07/01/2022   HCT 50.2 07/01/2022   PLT 223.0 07/01/2022   GLUCOSE 118 (H) 07/01/2022   CHOL 114 07/01/2022   TRIG 111.0 07/01/2022   HDL 35.40 (L) 07/01/2022   LDLDIRECT  148.7 04/12/2008   LDLCALC 56 07/01/2022   ALT 28 07/01/2022   AST 19 07/01/2022   NA 139 07/01/2022   K 4.3 07/01/2022   CL 105 07/01/2022   CREATININE 1.30 07/01/2022   BUN 17 07/01/2022   CO2 23 07/01/2022   TSH 2.21 07/01/2022   PSA 0.97 07/01/2022   HGBA1C 6.1 07/01/2022   Assessment/Plan:  ALBERTO PINA is a 78 y.o. White or Caucasian [1] male with  has a past medical history of Abdominal hernia, Allergic rhinitis, Anxiety, Aortic atherosclerosis (HCC) (04/25/2018), Benign neoplasm of stomach, BPH (benign prostatic hypertrophy), C6 cervical fracture (HCC), Cancer (HCC) (2000), Chronic sinusitis, CKD (chronic kidney disease), stage III (HCC), Diverticulitis, Diverticulosis (03/27/2015), ED (erectile dysfunction), GERD (gastroesophageal reflux disease), History of herpes genitalis, History of kidney stones (04/25/2018), HLD (hyperlipidemia), HTN (hypertension), Hydronephrosis, Inguinal hernia (04/25/2018), Low back pain, Migraine, Neck pain, bilateral, Obesity, Polycythemia (2000), Rotator cuff tear, Sleep apnea, and Tubular adenoma of colon (05/2008).  Vitamin D  deficiency Last vitamin D  Lab Results  Component Value Date   VD25OH 25.50 (L) 07/01/2022   Low, to start oral replacement   Smoker Pt counlsed to quit, pt not ready  Hyperglycemia Lab Results  Component Value Date   HGBA1C 6.1 07/01/2022   Stable, pt to continue current medical treatment  - diet, wt control   Essential hypertension BP Readings from Last 3 Encounters:  11/07/23 122/74  10/04/23 102/64  07/11/23 122/78   Stable, pt to continue medical treatment toprol  xl 50 qd   Acute upper respiratory infection Mild to mod, for antibx course doxycycline  100 bid,  to f/u any worsening symptoms or concerns  Allergic rhinitis Mild to mod, for medrol  pack asd,  to f/u any worsening symptoms or concerns  Followup: Return in about 6 months (around 05/09/2024).  Lynwood Rush, MD 11/07/2023 5:44 PM [facility]

## 2023-11-07 NOTE — Assessment & Plan Note (Signed)
Lab Results  Component Value Date   HGBA1C 6.1 07/01/2022   Stable, pt to continue current medical treatment  - diet, wt control

## 2023-11-07 NOTE — Patient Instructions (Signed)
 Please take all new medication as prescribed - the antibiotic, and medrol   Please continue all other medications as before, and refills have been done if requested.  Please have the pharmacy call with any other refills you may need.  Please continue your efforts at being more active, low cholesterol diet, and weight control.  Please keep your appointments with your specialists as you may have planned  Please make an Appointment to return in 6 months, or sooner if needed

## 2024-01-02 ENCOUNTER — Other Ambulatory Visit: Payer: Self-pay | Admitting: Internal Medicine

## 2024-01-10 ENCOUNTER — Ambulatory Visit: Admitting: Internal Medicine

## 2024-01-10 ENCOUNTER — Ambulatory Visit: Payer: Self-pay | Admitting: Internal Medicine

## 2024-01-10 ENCOUNTER — Encounter: Payer: Self-pay | Admitting: Internal Medicine

## 2024-01-10 VITALS — BP 122/80 | HR 50 | Temp 97.7°F | Ht 76.0 in | Wt 280.0 lb

## 2024-01-10 DIAGNOSIS — R079 Chest pain, unspecified: Secondary | ICD-10-CM | POA: Diagnosis not present

## 2024-01-10 DIAGNOSIS — Z23 Encounter for immunization: Secondary | ICD-10-CM | POA: Diagnosis not present

## 2024-01-10 DIAGNOSIS — N1831 Chronic kidney disease, stage 3a: Secondary | ICD-10-CM

## 2024-01-10 DIAGNOSIS — E559 Vitamin D deficiency, unspecified: Secondary | ICD-10-CM | POA: Diagnosis not present

## 2024-01-10 DIAGNOSIS — I10 Essential (primary) hypertension: Secondary | ICD-10-CM

## 2024-01-10 DIAGNOSIS — E538 Deficiency of other specified B group vitamins: Secondary | ICD-10-CM

## 2024-01-10 DIAGNOSIS — N320 Bladder-neck obstruction: Secondary | ICD-10-CM

## 2024-01-10 DIAGNOSIS — R739 Hyperglycemia, unspecified: Secondary | ICD-10-CM

## 2024-01-10 DIAGNOSIS — F17200 Nicotine dependence, unspecified, uncomplicated: Secondary | ICD-10-CM

## 2024-01-10 DIAGNOSIS — E7800 Pure hypercholesterolemia, unspecified: Secondary | ICD-10-CM

## 2024-01-10 LAB — CBC WITH DIFFERENTIAL/PLATELET
Basophils Absolute: 0.1 K/uL (ref 0.0–0.1)
Basophils Relative: 0.8 % (ref 0.0–3.0)
Eosinophils Absolute: 0.1 K/uL (ref 0.0–0.7)
Eosinophils Relative: 2.2 % (ref 0.0–5.0)
HCT: 50.1 % (ref 39.0–52.0)
Hemoglobin: 16.9 g/dL (ref 13.0–17.0)
Lymphocytes Relative: 26.3 % (ref 12.0–46.0)
Lymphs Abs: 1.8 K/uL (ref 0.7–4.0)
MCHC: 33.8 g/dL (ref 30.0–36.0)
MCV: 91 fl (ref 78.0–100.0)
Monocytes Absolute: 0.5 K/uL (ref 0.1–1.0)
Monocytes Relative: 6.8 % (ref 3.0–12.0)
Neutro Abs: 4.4 K/uL (ref 1.4–7.7)
Neutrophils Relative %: 63.9 % (ref 43.0–77.0)
Platelets: 195 K/uL (ref 150.0–400.0)
RBC: 5.51 Mil/uL (ref 4.22–5.81)
RDW: 13.3 % (ref 11.5–15.5)
WBC: 6.8 K/uL (ref 4.0–10.5)

## 2024-01-10 LAB — HEPATIC FUNCTION PANEL
ALT: 19 U/L (ref 0–53)
AST: 16 U/L (ref 0–37)
Albumin: 4.4 g/dL (ref 3.5–5.2)
Alkaline Phosphatase: 76 U/L (ref 39–117)
Bilirubin, Direct: 0.2 mg/dL (ref 0.0–0.3)
Total Bilirubin: 1.1 mg/dL (ref 0.2–1.2)
Total Protein: 6.6 g/dL (ref 6.0–8.3)

## 2024-01-10 LAB — URINALYSIS, ROUTINE W REFLEX MICROSCOPIC
Bilirubin Urine: NEGATIVE
Hgb urine dipstick: NEGATIVE
Ketones, ur: NEGATIVE
Leukocytes,Ua: NEGATIVE
Nitrite: NEGATIVE
RBC / HPF: NONE SEEN (ref 0–?)
Specific Gravity, Urine: 1.02 (ref 1.000–1.030)
Total Protein, Urine: NEGATIVE
Urine Glucose: NEGATIVE
Urobilinogen, UA: 0.2 (ref 0.0–1.0)
pH: 5.5 (ref 5.0–8.0)

## 2024-01-10 LAB — LIPID PANEL
Cholesterol: 115 mg/dL (ref 0–200)
HDL: 37.6 mg/dL — ABNORMAL LOW (ref >39.00)
LDL Cholesterol: 61 mg/dL (ref 0–99)
NonHDL: 77.13
Total CHOL/HDL Ratio: 3
Triglycerides: 81 mg/dL (ref 0.0–149.0)
VLDL: 16.2 mg/dL (ref 0.0–40.0)

## 2024-01-10 LAB — BASIC METABOLIC PANEL WITH GFR
BUN: 20 mg/dL (ref 6–23)
CO2: 23 meq/L (ref 19–32)
Calcium: 9.2 mg/dL (ref 8.4–10.5)
Chloride: 105 meq/L (ref 96–112)
Creatinine, Ser: 1.26 mg/dL (ref 0.40–1.50)
GFR: 55.47 mL/min — ABNORMAL LOW (ref >60.00)
Glucose, Bld: 108 mg/dL — ABNORMAL HIGH (ref 70–99)
Potassium: 4.4 meq/L (ref 3.5–5.1)
Sodium: 137 meq/L (ref 135–145)

## 2024-01-10 LAB — HEMOGLOBIN A1C: Hgb A1c MFr Bld: 5.9 % (ref 4.6–6.5)

## 2024-01-10 LAB — TSH: TSH: 1.88 u[IU]/mL (ref 0.35–5.50)

## 2024-01-10 LAB — PSA: PSA: 1.05 ng/mL (ref 0.10–4.00)

## 2024-01-10 LAB — VITAMIN D 25 HYDROXY (VIT D DEFICIENCY, FRACTURES): VITD: 25.11 ng/mL — ABNORMAL LOW (ref 30.00–100.00)

## 2024-01-10 LAB — VITAMIN B12: Vitamin B-12: 211 pg/mL (ref 211–911)

## 2024-01-10 NOTE — Assessment & Plan Note (Signed)
 Lab Results  Component Value Date   VITAMINB12 211 01/10/2024   Low, to start oral replacement - b12 1000 mcg qd

## 2024-01-10 NOTE — Assessment & Plan Note (Addendum)
 Now down to 2-3 per year cigars, counsled to quit, pt not ready

## 2024-01-10 NOTE — Assessment & Plan Note (Signed)
 BP Readings from Last 3 Encounters:  01/10/24 122/80  11/07/23 122/74  10/04/23 102/64   Stable, pt to continue medical treatment toprol  xl 50 qd

## 2024-01-10 NOTE — Progress Notes (Signed)
 Patient ID: Antonio Reyes, male   DOB: [birth year], 78 y.o.   MRN: [id number]        Chief Complaint: follow up chest pain, senile purpura, low vit d, smoker, hyperglycemia, hld, htn       HPI:  Antonio Reyes is a 78 y.o. male here with fleeting sharp left chest pain for seconds mostly at mid left sternal border with soreness at times.  Pt denies increased sob or doe, wheezing, orthopnea, PND, increased LE swelling, palpitations, dizziness or syncope.  Pt denies polydipsia, polyuria, or new focal neuro s/s.    Pt denies fever, night sweats, loss of appetite, or other constitutional symptoms   Had wt loss to 270 but regained some wt with less better diet.  Peak wt has been 300, dropped wt and less knee pain with pickleball with watching carbs.   Due for flu shot.  Also has several small bruising to arms not sure how this happened Wt Readings from Last 3 Encounters:  01/10/24 280 lb (127 kg)  11/07/23 275 lb (124.7 kg)  10/04/23 274 lb 9.6 oz (124.6 kg)   BP Readings from Last 3 Encounters:  01/10/24 122/80  11/07/23 122/74  10/04/23 102/64         Past Medical History:  Diagnosis Date   Abdominal hernia    Allergic rhinitis    Anxiety    pt states no history of anxiety   Aortic atherosclerosis 04/25/2018   noted on CT renal   Benign neoplasm of stomach    BPH (benign prostatic hypertrophy)    pt unaware   C6 cervical fracture (HCC)    C7,C8   Cancer (HCC) 2000   skin cancer   left ear   Chronic sinusitis    CKD (chronic kidney disease), stage III (HCC)    pt unaware   Diverticulitis    Diverticulosis 03/27/2015   Moderate, Noted on colonoscopy   ED (erectile dysfunction)    GERD (gastroesophageal reflux disease)    History of herpes genitalis    History of kidney stones 04/25/2018   4 mm left UVJ stone, 10 mm bladder stone, noted on CT renal   HLD (hyperlipidemia)    HTN (hypertension)    Hydronephrosis    Left, Moderate, noted on CT renal   Inguinal hernia 04/25/2018    Bilateral, noted on CT renal, pt unaware   Low back pain    Migraine    hx of   Neck pain, bilateral    Obesity    Polycythemia 2000   per Dr. Jude   Rotator cuff tear    Sleep apnea    CPAP   Tubular adenoma of colon 05/2008   Past Surgical History:  Procedure Laterality Date   COLONOSCOPY  03/27/2015   CYSTOSCOPY WITH RETROGRADE PYELOGRAM, URETEROSCOPY AND STENT PLACEMENT Left 05/31/2018   Procedure: CYSTOSCOPY WITH RETROGRADE PYELOGRAM, URETEROSCOPY AND STENT PLACEMENT;  Surgeon: Alvaro Hummer, MD;  Location: [facility];  Service: Urology;  Laterality: Left;  1 HR   HERNIA REPAIR     umbilical with mesh   HOLMIUM LASER APPLICATION Left 05/31/2018   Procedure: HOLMIUM LASER APPLICATION;  Surgeon: Alvaro Hummer, MD;  Location: [facility];  Service: Urology;  Laterality: Left;   KNEE SURGERY Right    mensicus   2011   POLYPECTOMY     UPPER GI ENDOSCOPY  06/16/2005   viterectomy Right 11/2022   on Right eye    reports that he has been smoking  cigars. He has never used smokeless tobacco. He reports current alcohol use of about 1.0 standard drink of alcohol per week. He reports that he does not use drugs. family history includes Colon cancer (age of onset: 78) in his mother; Pancreatic cancer in his paternal grandfather. Allergies  Allergen Reactions   Olive Tree Other (See Comments)   Pollen Extract     Other reaction(s): Other (See Comments)   Current Outpatient Medications on File Prior to Visit  Medication Sig Dispense Refill   aspirin 81 MG tablet Take 81 mg by mouth daily.     atorvastatin  (LIPITOR) 40 MG tablet TAKE 1 TABLET EVERY DAY 90 tablet 3   doxycycline  (VIBRA -TABS) 100 MG tablet Take 1 tablet (100 mg total) by mouth 2 (two) times daily. 20 tablet 0   fluticasone  (FLONASE ) 50 MCG/ACT nasal spray USE 2 SPRAYS IN EACH NOSTRIL EVERY DAY (SUBSTITUTED FOR FLONASE ) (NEED MD APPOINTMENT FOR REFILLS) 48 g 3   ibuprofen (ADVIL,MOTRIN) 200 MG tablet Take 200 mg by mouth  every 6 (six) hours as needed for headache or moderate pain.     methylPREDNISolone  (MEDROL  DOSEPAK) 4 MG TBPK tablet Take as directed on packaging. 1 each 0   metoprolol  succinate (TOPROL -XL) 50 MG 24 hr tablet TAKE 1 TABLET EVERY DAY 90 tablet 3   montelukast  (SINGULAIR ) 10 MG tablet TAKE 1 TABLET EVERY DAY 90 tablet 3   omeprazole  (PRILOSEC) 20 MG capsule Take 1 capsule (20 mg total) by mouth daily. 30 capsule 11   tadalafil  (CIALIS ) 5 MG tablet TAKE 1 TABLET (5 MG TOTAL) BY MOUTH DAILY. 90 tablet 3   No current facility-administered medications on file prior to visit.        ROS:  All others reviewed and negative.  Objective        PE:  BP 122/80 (BP Location: Left Arm, Patient Position: Sitting, Cuff Size: Normal)   Pulse (!) 50   Temp 97.7 F (36.5 C) (Oral)   Ht 6' 4 (1.93 m)   Wt 280 lb (127 kg)   SpO2 95%   BMI 34.08 kg/m                 Constitutional: Pt appears in NAD               HENT: Head: NCAT.                Right Ear: External ear normal.                 Left Ear: External ear normal.                Eyes: . Pupils are equal, round, and reactive to light. Conjunctivae and EOM are normal               Nose: without d/c or deformity               Neck: Neck supple. Gross normal ROM               Cardiovascular: Normal rate and regular rhythm.                 Pulmonary/Chest: Effort normal and breath sounds without rales or wheezing.                Abd:  Soft, NT, ND, + BS, no organomegaly               Neurological: Pt is alert. At baseline  orientation, motor grossly intact               Skin: Skin is warm. No rashes, no other new lesions except for senile purpura several lesions to arms, LE edema - none               Psychiatric: Pt behavior is normal without agitation   Micro: none  Cardiac tracings I have personally interpreted today:  none  Pertinent Radiological findings (summarize): none   Lab Results  Component Value Date   WBC 6.8 01/10/2024    HGB 16.9 01/10/2024   HCT 50.1 01/10/2024   PLT 195.0 01/10/2024   GLUCOSE 108 (H) 01/10/2024   CHOL 115 01/10/2024   TRIG 81.0 01/10/2024   HDL 37.60 (L) 01/10/2024   LDLDIRECT 148.7 04/12/2008   LDLCALC 61 01/10/2024   ALT 19 01/10/2024   AST 16 01/10/2024   NA 137 01/10/2024   K 4.4 01/10/2024   CL 105 01/10/2024   CREATININE 1.26 01/10/2024   BUN 20 01/10/2024   CO2 23 01/10/2024   TSH 1.88 01/10/2024   PSA 1.05 01/10/2024   HGBA1C 5.9 01/10/2024   Assessment/Plan:  Antonio Reyes is a 78 y.o. White or Caucasian [1] male with  has a past medical history of Abdominal hernia, Allergic rhinitis, Anxiety, Aortic atherosclerosis (04/25/2018), Benign neoplasm of stomach, BPH (benign prostatic hypertrophy), C6 cervical fracture (HCC), Cancer (HCC) (2000), Chronic sinusitis, CKD (chronic kidney disease), stage III (HCC), Diverticulitis, Diverticulosis (03/27/2015), ED (erectile dysfunction), GERD (gastroesophageal reflux disease), History of herpes genitalis, History of kidney stones (04/25/2018), HLD (hyperlipidemia), HTN (hypertension), Hydronephrosis, Inguinal hernia (04/25/2018), Low back pain, Migraine, Neck pain, bilateral, Obesity, Polycythemia (2000), Rotator cuff tear, Sleep apnea, and Tubular adenoma of colon (05/2008).  Smoker Now down to 2-3 per year cigars, counsled to quit, pt not ready  Vitamin D  deficiency Last vitamin D  Lab Results  Component Value Date   VD25OH 25.11 (L) 01/10/2024   Low, to start oral replacement   Hyperglycemia Lab Results  Component Value Date   HGBA1C 5.9 01/10/2024   Stable, pt to continue current medical treatment  - diet, wt control   HYPERCHOLESTEROLEMIA Lab Results  Component Value Date   LDLCALC 61 01/10/2024   Stable, pt to continue current statin lipitor 40 mg qd   Essential hypertension BP Readings from Last 3 Encounters:  01/10/24 122/80  11/07/23 122/74  10/04/23 102/64   Stable, pt to continue medical treatment  toprol  xl 50 qd   CKD (chronic kidney disease) stage 3, GFR 30-59 ml/min (HCC) Lab Results  Component Value Date   CREATININE 1.26 01/10/2024   Stable overall, cont to avoid nephrotoxins ckd3a  B12 deficiency Lab Results  Component Value Date   VITAMINB12 211 01/10/2024   Low, to start oral replacement - b12 1000 mcg qd   CHEST PAIN None now, exam c/w msk strain or costochondritis, very low suspicion for cardiac, for tylenol  prn,  to f/u any worsening symptoms or concerns  Followup: Return in about 6 months (around 07/10/2024).  Lynwood Rush, MD 01/10/2024 9:05 PM [facility]

## 2024-01-10 NOTE — Assessment & Plan Note (Signed)
 Lab Results  Component Value Date   LDLCALC 61 01/10/2024   Stable, pt to continue current statin lipitor 40 mg qd

## 2024-01-10 NOTE — Assessment & Plan Note (Signed)
 Lab Results  Component Value Date   CREATININE 1.26 01/10/2024   Stable overall, cont to avoid nephrotoxins ckd3a

## 2024-01-10 NOTE — Assessment & Plan Note (Signed)
 None now, exam c/w msk strain or costochondritis, very low suspicion for cardiac, for tylenol  prn,  to f/u any worsening symptoms or concerns

## 2024-01-10 NOTE — Assessment & Plan Note (Signed)
 Last vitamin D  Lab Results  Component Value Date   VD25OH 25.11 (L) 01/10/2024   Low, to start oral replacement

## 2024-01-10 NOTE — Patient Instructions (Signed)
 You had the flu shot today  Please continue all other medications as before, and refills have been done if requested.  Please have the pharmacy call with any other refills you may need.  Please continue your efforts at being more active, low cholesterol diet, and weight control.  You are otherwise up to date with prevention measures today.  Please keep your appointments with your specialists as you may have planned  Please go to the LAB at the blood drawing area for the tests to be done  You will be contacted by phone if any changes need to be made immediately.  Otherwise, you will receive a letter about your results with an explanation, but please check with MyChart first.  Please make an Appointment to return in 6 months, or sooner if needed

## 2024-01-10 NOTE — Assessment & Plan Note (Signed)
 Lab Results  Component Value Date   HGBA1C 5.9 01/10/2024   Stable, pt to continue current medical treatment  - diet, wt control

## 2024-01-12 LAB — LIPOPROTEIN A (LPA): Lipoprotein (a): 16 nmol/L (ref <75)

## 2024-01-18 DIAGNOSIS — H2512 Age-related nuclear cataract, left eye: Secondary | ICD-10-CM | POA: Diagnosis not present

## 2024-01-18 DIAGNOSIS — Z8669 Personal history of other diseases of the nervous system and sense organs: Secondary | ICD-10-CM | POA: Diagnosis not present

## 2024-01-18 DIAGNOSIS — H35351 Cystoid macular degeneration, right eye: Secondary | ICD-10-CM | POA: Diagnosis not present

## 2024-01-18 DIAGNOSIS — Z961 Presence of intraocular lens: Secondary | ICD-10-CM | POA: Diagnosis not present

## 2024-01-18 DIAGNOSIS — H35372 Puckering of macula, left eye: Secondary | ICD-10-CM | POA: Diagnosis not present

## 2024-01-23 ENCOUNTER — Encounter: Payer: Self-pay | Admitting: Radiology

## 2024-02-03 ENCOUNTER — Ambulatory Visit

## 2024-02-03 ENCOUNTER — Ambulatory Visit (INDEPENDENT_AMBULATORY_CARE_PROVIDER_SITE_OTHER)

## 2024-02-03 VITALS — Ht 75.5 in | Wt 280.0 lb

## 2024-02-03 DIAGNOSIS — Z0000 Encounter for general adult medical examination without abnormal findings: Secondary | ICD-10-CM

## 2024-02-03 NOTE — Patient Instructions (Addendum)
 Antonio Reyes,  Thank you for taking the time for your Medicare Wellness Visit. I appreciate your continued commitment to your health goals. Please review the care plan we discussed, and feel free to reach out if I can assist you further.  Please note that Annual Wellness Visits do not include a physical exam. Some assessments may be limited, especially if the visit was conducted virtually. If needed, we may recommend an in-person follow-up with your provider.  Ongoing Care Seeing your primary care provider every 3 to 6 months helps us  monitor your health and provide consistent, personalized care.   Referrals If a referral was made during today's visit and you haven't received any updates within two weeks, please contact the referred provider directly to check on the status.  Recommended Screenings:  Health Maintenance  Topic Date Due   Colon Cancer Screening  07/07/2024   Medicare Annual Wellness Visit  02/02/2025   DTaP/Tdap/Td vaccine (6 - Td or Tdap) 04/24/2028   Pneumococcal Vaccine for age over 38  Completed   Flu Shot  Completed   Hepatitis C Screening  Completed   Zoster (Shingles) Vaccine  Completed   Meningitis B Vaccine  Aged Out   COVID-19 Vaccine  Discontinued       02/03/2024    9:33 AM  Advanced Directives  Does Patient Have a Medical Advance Directive? Yes  Type of Advance Directive Living will;Healthcare Power of Attorney  Does patient want to make changes to medical advance directive? Yes (Inpatient - patient requests chaplain consult to change a medical advance directive)  Copy of Healthcare Power of Attorney in Chart? No - copy requested    Vision: Annual vision screenings are recommended for early detection of glaucoma, cataracts, and diabetic retinopathy. These exams can also reveal signs of chronic conditions such as diabetes and high blood pressure.  Dental: Annual dental screenings help detect early signs of oral cancer, gum disease, and other conditions  linked to overall health, including heart disease and diabetes.

## 2024-02-03 NOTE — Progress Notes (Signed)
 Chief Complaint  Patient presents with   Medicare Wellness     Subjective:   Antonio Reyes is a 78 y.o. male who presents for a Medicare Annual Wellness Visit.  I connected with  Antonio Reyes on 02/03/24 by a audio enabled telemedicine application and verified that I am speaking with the correct person using two identifiers.  Patient Location: Home  Provider Location: Office/Clinic  Persons Participating in Visit: Patient.  I discussed the limitations of evaluation and management by telemedicine. The patient expressed understanding and agreed to proceed.  Vital Signs: Because this visit was a virtual/telehealth visit, some criteria may be missing or patient reported. Any vitals not documented were not able to be obtained and vitals that have been documented are patient reported.   Allergies (verified) Olive tree and Pollen extract   History: Past Medical History:  Diagnosis Date   Abdominal hernia    Allergic rhinitis    Anxiety    pt states no history of anxiety   Aortic atherosclerosis 04/25/2018   noted on CT renal   Benign neoplasm of stomach    BPH (benign prostatic hypertrophy)    pt unaware   C6 cervical fracture (HCC)    C7,C8   Cancer (HCC) 2000   skin cancer   left ear   Chronic sinusitis    CKD (chronic kidney disease), stage III (HCC)    pt unaware   Diverticulitis    Diverticulosis 03/27/2015   Moderate, Noted on colonoscopy   ED (erectile dysfunction)    GERD (gastroesophageal reflux disease)    History of herpes genitalis    History of kidney stones 04/25/2018   4 mm left UVJ stone, 10 mm bladder stone, noted on CT renal   HLD (hyperlipidemia)    HTN (hypertension)    Hydronephrosis    Left, Moderate, noted on CT renal   Inguinal hernia 04/25/2018   Bilateral, noted on CT renal, pt unaware   Low back pain    Migraine    hx of   Neck pain, bilateral    Obesity    Polycythemia 2000   per Dr. Jude   Rotator cuff tear    Sleep apnea     CPAP   Tubular adenoma of colon 05/2008   Past Surgical History:  Procedure Laterality Date   COLONOSCOPY  03/27/2015   CYSTOSCOPY WITH RETROGRADE PYELOGRAM, URETEROSCOPY AND STENT PLACEMENT Left 05/31/2018   Procedure: CYSTOSCOPY WITH RETROGRADE PYELOGRAM, URETEROSCOPY AND STENT PLACEMENT;  Surgeon: Alvaro Hummer, MD;  Location: [facility];  Service: Urology;  Laterality: Left;  1 HR   HERNIA REPAIR     umbilical with mesh   HOLMIUM LASER APPLICATION Left 05/31/2018   Procedure: HOLMIUM LASER APPLICATION;  Surgeon: Alvaro Hummer, MD;  Location: [facility];  Service: Urology;  Laterality: Left;   KNEE SURGERY Right    mensicus   2011   POLYPECTOMY     UPPER GI ENDOSCOPY  06/16/2005   viterectomy Right 11/2022   on Right eye   Family History  Problem Relation Age of Onset   Colon cancer Mother 36   Pancreatic cancer Paternal Grandfather    Colon polyps Neg Hx    Esophageal cancer Neg Hx    Stomach cancer Neg Hx    Rectal cancer Neg Hx    Social History   Occupational History   Occupation: corporate treasurer ( Psychologist, Educational)   Occupation: Retired  Tobacco Use   Smoking status: Light Smoker  Types: Cigars   Smokeless tobacco: Never   Tobacco comments:    occasionally  every 3 months  Vaping Use   Vaping status: Never Used  Substance and Sexual Activity   Alcohol use: Yes    Alcohol/week: 2.0 standard drinks of alcohol    Types: 1 Cans of beer, 1 Standard drinks or equivalent per week    Comment: rare   Drug use: No   Sexual activity: Yes   Tobacco Counseling Ready to quit: No Counseling given: Yes Tobacco comments: occasionally  every 3 months  SDOH Screenings   Food Insecurity: No Food Insecurity (02/03/2024)  Housing: Unknown (02/03/2024)  Transportation Needs: No Transportation Needs (02/03/2024)  Utilities: Not At Risk (02/03/2024)  Alcohol Screen: Low Risk  (01/31/2023)  Depression (PHQ2-9): Low Risk  (02/03/2024)  Financial Resource Strain: Low Risk   (01/31/2023)  Physical Activity: Sufficiently Active (02/03/2024)  Social Connections: Moderately Integrated (02/03/2024)  Stress: No Stress Concern Present (02/03/2024)  Tobacco Use: High Risk (02/03/2024)  Health Literacy: Adequate Health Literacy (02/03/2024)   See flowsheets for full screening details  Depression Screen PHQ 2 & 9 Depression Scale- Over the past 2 weeks, how often have you been bothered by any of the following problems? Little interest or pleasure in doing things: 0 Feeling down, depressed, or hopeless (PHQ Adolescent also includes...irritable): 0 PHQ-2 Total Score: 0 Trouble falling or staying asleep, or sleeping too much: 0 Feeling tired or having little energy: 0 Poor appetite or overeating (PHQ Adolescent also includes...weight loss): 0 Feeling bad about yourself - or that you are a failure or have let yourself or your family down: 0 Trouble concentrating on things, such as reading the newspaper or watching television (PHQ Adolescent also includes...like school work): 0 Moving or speaking so slowly that other people could have noticed. Or the opposite - being so fidgety or restless that you have been moving around a lot more than usual: 0 Thoughts that you would be better off dead, or of hurting yourself in some way: 0 PHQ-9 Total Score: 0 If you checked off any problems, how difficult have these problems made it for you to do your work, take care of things at home, or get along with other people?: Not difficult at all  Depression Treatment Depression Interventions/Treatment : EYV7-0 Score <4 Follow-up Not Indicated     Goals Addressed               This Visit's Progress     Patient Stated (pt-stated)        Patient stated he plans to continue watching his diet and lose weight       Visit info / Clinical Intake: Medicare Wellness Visit Type:: Subsequent Annual Wellness Visit Persons participating in visit:: patient Medicare Wellness Visit Mode::  Telephone If telephone:: video declined Because this visit was a virtual/telehealth visit:: pt reported vitals If Telephone or Video please confirm:: I connected with the patient using audio enabled telemedicine application and verified that I am speaking with the correct person using two identifiers; I discussed the limitations of evaluation and management by telemedicine; The patient expressed understanding and agreed to proceed Patient Location:: Home Provider Location:: Office Information given by:: patient Interpreter Needed?: No Pre-visit prep was completed: yes AWV questionnaire completed by patient prior to visit?: no Living arrangements:: lives with spouse/significant other Patient's Overall Health Status Rating: good Typical amount of pain: none Does pain affect daily life?: no Are you currently prescribed opioids?: no  Dietary Habits and Nutritional  Risks How many meals a day?: 2 Eats fruit and vegetables daily?: yes Most meals are obtained by: preparing own meals In the last 2 weeks, have you had any of the following?: none Diabetic:: no  Functional Status Activities of Daily Living (to include ambulation/medication): Independent Ambulation: Independent with device- listed below Home Assistive Devices/Equipment: Eyeglasses; CPAP (wears eyeglasses for reading) Medication Administration: Independent Home Management: Independent Manage your own finances?: yes Primary transportation is: driving Concerns about vision?: no *vision screening is required for WTM* Concerns about hearing?: no  Fall Screening Falls in the past year?: 0 Number of falls in past year: 0 Was there an injury with Fall?: 0 Fall Risk Category Calculator: 0 Patient Fall Risk Level: Low Fall Risk  Fall Risk Patient at Risk for Falls Due to: No Fall Risks Fall risk Follow up: Falls evaluation completed  Home and Transportation Safety: All rugs have non-skid backing?: N/A, no rugs All stairs or  steps have railings?: yes Grab bars in the bathtub or shower?: (!) no Have non-skid surface in bathtub or shower?: yes Good home lighting?: yes Regular seat belt use?: yes Hospital stays in the last year:: no  Cognitive Assessment Difficulty concentrating, remembering, or making decisions? : no Will 6CIT or Mini Cog be Completed: yes What year is it?: 0 points What month is it?: 0 points Give patient an address phrase to remember (5 components): [address] [place], [place] About what time is it?: 0 points Count backwards from 20 to 1: 0 points Say the months of the year in reverse: 0 points Repeat the address phrase from earlier: 0 points 6 CIT Score: 0 points  Advance Directives (For Healthcare) Does Patient Have a Medical Advance Directive?: Yes Does patient want to make changes to medical advance directive?: Yes (Inpatient - patient requests chaplain consult to change a medical advance directive) Type of Advance Directive: Living will; Healthcare Power of Attorney Copy of Healthcare Power of Attorney in Chart?: No - copy requested Copy of Living Will in Chart?: No - copy requested  Reviewed/Updated  Reviewed/Updated: Reviewed All (Medical, Surgical, Family, Medications, Allergies, Care Teams, Patient Goals)        Objective:    Today's Vitals   02/03/24 0931  Weight: 280 lb (127 kg)  Height: 6' 3.5 (1.918 m)   Body mass index is 34.54 kg/m.  Current Medications (verified) Outpatient Encounter Medications as of 02/03/2024  Medication Sig   aspirin 81 MG tablet Take 81 mg by mouth daily.   atorvastatin  (LIPITOR) 40 MG tablet TAKE 1 TABLET EVERY DAY   doxycycline  (VIBRA -TABS) 100 MG tablet Take 1 tablet (100 mg total) by mouth 2 (two) times daily.   fluticasone  (FLONASE ) 50 MCG/ACT nasal spray USE 2 SPRAYS IN EACH NOSTRIL EVERY DAY (SUBSTITUTED FOR FLONASE ) (NEED MD APPOINTMENT FOR REFILLS)   ibuprofen (ADVIL,MOTRIN) 200 MG tablet Take 200 mg by mouth every 6 (six)  hours as needed for headache or moderate pain.   methylPREDNISolone  (MEDROL  DOSEPAK) 4 MG TBPK tablet Take as directed on packaging.   metoprolol  succinate (TOPROL -XL) 50 MG 24 hr tablet TAKE 1 TABLET EVERY DAY   montelukast  (SINGULAIR ) 10 MG tablet TAKE 1 TABLET EVERY DAY   omeprazole  (PRILOSEC) 20 MG capsule Take 1 capsule (20 mg total) by mouth daily.   tadalafil  (CIALIS ) 5 MG tablet TAKE 1 TABLET (5 MG TOTAL) BY MOUTH DAILY.   No facility-administered encounter medications on file as of 02/03/2024.   Hearing/Vision screen Hearing Screening - Comments:: Denies  hearing difficulties   Vision Screening - Comments:: Wears eyeglasses for reading- up to date with routine eye exams with [facility] Immunizations and Health Maintenance Health Maintenance  Topic Date Due   Colonoscopy  07/07/2024   Medicare Annual Wellness (AWV)  02/02/2025   DTaP/Tdap/Td (6 - Td or Tdap) 04/24/2028   Pneumococcal Vaccine: 50+ Years  Completed   Influenza Vaccine  Completed   Hepatitis C Screening  Completed   Zoster Vaccines- Shingrix  Completed   Meningococcal B Vaccine  Aged Out   COVID-19 Vaccine  Discontinued        Assessment/Plan:  This is a routine wellness examination for Willow.  Patient Care Team: Norleen Lynwood ORN, MD as PCP - General Branch, Ronal BRAVO, MD (Inactive) as PCP - Cardiology (Cardiology) Marcey Elspeth PARAS, MD as Consulting Physician (Ophthalmology) Ivin Kocher, MD as Referring Physician (Dermatology) Center, [facility]  I have personally reviewed and noted the following in the patient's chart:   Medical and social history Use of alcohol, tobacco or illicit drugs  Current medications and supplements including opioid prescriptions. Functional ability and status Nutritional status Physical activity Advanced directives List of other physicians Hospitalizations, surgeries, and ER visits in previous 12 months Vitals Screenings to include cognitive, depression, and  falls Referrals and appointments  No orders of the defined types were placed in this encounter.  In addition, I have reviewed and discussed with patient certain preventive protocols, quality metrics, and best practice recommendations. A written personalized care plan for preventive services as well as general preventive health recommendations were provided to patient.   Verdie CHRISTELLA Saba, CMA   02/03/2024   Return in 1 year (on 02/02/2025).  After Visit Summary: (MyChart) Due to this being a telephonic visit, the after visit summary with patients personalized plan was offered to patient via MyChart   Nurse Notes: Scheduled 2026 AWV appt.

## 2024-03-02 ENCOUNTER — Encounter: Payer: Self-pay | Admitting: Family Medicine

## 2024-03-02 ENCOUNTER — Ambulatory Visit: Admitting: Family Medicine

## 2024-03-02 VITALS — BP 136/70 | HR 75 | Temp 97.6°F | Ht 75.5 in | Wt 280.0 lb

## 2024-03-02 DIAGNOSIS — J0110 Acute frontal sinusitis, unspecified: Secondary | ICD-10-CM | POA: Diagnosis not present

## 2024-03-02 MED ORDER — AMOXICILLIN-POT CLAVULANATE 875-125 MG PO TABS: 1.0000 | ORAL_TABLET | Freq: Two times a day (BID) | ORAL | 0 refills | Status: DC

## 2024-03-02 MED ORDER — PREDNISONE 20 MG PO TABS: 40.0000 mg | ORAL_TABLET | Freq: Every day | ORAL | 0 refills | Status: DC

## 2024-03-02 NOTE — Patient Instructions (Signed)
 Take the antibiotic with food and plenty of water.  Take the steroid first thing in the morning with food and plenty of water as well.  Steroids can cause difficulty sleeping if you take them later in the day.  Continue treating your symptoms with over-the-counter medications such as Mucinex, DayQuil or NyQuil.  You can use Tylenol  or ibuprofen for headache and sinus pain  You can do salt water gargles and throat lozenges for sore throat.

## 2024-03-02 NOTE — Progress Notes (Signed)
 Subjective:    Discussed the use of AI scribe software for clinical note transcription with the patient, who gave verbal consent to proceed.  History of Present Illness Antonio Reyes is a 78 year old male who presents with nasal congestion and sinus pain.  Nasal congestion and sinus pain - Severe nasal congestion and sinus pain for approximately one week - Marked head pressure behind the eyes - Sensation of eye swelling - Congestion disrupts sleep; often needs to sit up to breathe more easily - Nose described as completely blocked - Mild dry cough present - No fever, chills, body aches, chest pain, palpitations, shortness of breath, or wheezing  Symptom management and medication use - Using Nyquil at night and 12-hour Mucinex during the day with only temporary relief - Flonase  prescribed but not currently using it  Sinus symptom pattern and triggers - Experiences similar sinus episodes a couple of times per year, often following air travel  Tobacco use - Smokes a cigar once every couple of months - Avoids smoking during sinus flares  Allergies - No known antibiotic allergies  Travel plans - Planning a cruise to the [place] in 2 days    ROS as in subjective.   Objective: Vitals:   03/02/24 0922  BP: 136/70  Pulse: 75  Temp: 97.6 F (36.4 C)  SpO2: 97%    General appearance: Alert, WD/WN, no distress, mildly ill appearing                             Skin: warm, no rash                           Head: + frontal sinus tenderness                            Eyes: conjunctiva normal, corneas clear, PERRLA                            Ears: pearly TMs, external ear canals normal                          Nose: septum midline, turbinates swollen, with erythema and clear discharge             Mouth/throat: MMM, tongue normal, mild pharyngeal erythema                           Neck: supple, no adenopathy, no thyromegaly, nontender                          Heart: RRR                          Lungs: CTA bilaterally, no wheezes, rales, or rhonchi      Assessment/Plan:  Assessment and Plan Assessment & Plan Acute sinusitis Nasal congestion, sinus pain, and pressure for one week. Symptoms not improving, suggesting possible bacterial etiology. Differential includes viral versus bacterial sinusitis. Decision to treat with antibiotics due to duration and lack of improvement. Augmentin  chosen over doxycycline  due to sun sensitivity concerns during upcoming cruise. - Prescribed Augmentin  (amoxicillin  and clavulanate) with food and plenty of water. - Prescribed a 5-day course of steroids  if symptoms worsen or antibiotics are insufficient.  Mild cough Dry cough present, likely secondary to sinus drainage. No significant respiratory symptoms reported. - Continue using Tessalon  Perles as needed for cough.  Frontal headache Headache associated with sinus pressure, likely related to sinusitis. No sore throat reported, but drainage present. - Continue using OTC medication as needed for headache relief.

## 2024-03-09 ENCOUNTER — Encounter: Payer: Self-pay | Admitting: Internal Medicine

## 2024-03-09 ENCOUNTER — Ambulatory Visit: Payer: Self-pay | Admitting: Sports Medicine

## 2024-03-09 ENCOUNTER — Telehealth: Payer: Self-pay

## 2024-03-09 ENCOUNTER — Other Ambulatory Visit: Payer: Self-pay | Admitting: Family Medicine

## 2024-03-09 ENCOUNTER — Other Ambulatory Visit: Payer: Self-pay

## 2024-03-09 DIAGNOSIS — M25571 Pain in right ankle and joints of right foot: Secondary | ICD-10-CM | POA: Diagnosis not present

## 2024-03-09 DIAGNOSIS — G8929 Other chronic pain: Secondary | ICD-10-CM | POA: Diagnosis not present

## 2024-03-09 DIAGNOSIS — M7731 Calcaneal spur, right foot: Secondary | ICD-10-CM | POA: Diagnosis not present

## 2024-03-09 DIAGNOSIS — M6701 Short Achilles tendon (acquired), right ankle: Secondary | ICD-10-CM | POA: Diagnosis not present

## 2024-03-09 DIAGNOSIS — J329 Chronic sinusitis, unspecified: Secondary | ICD-10-CM

## 2024-03-09 DIAGNOSIS — M6788 Other specified disorders of synovium and tendon, other site: Secondary | ICD-10-CM

## 2024-03-09 NOTE — Progress Notes (Signed)
 "  Antonio Reyes - 78 y.o. male MRN [id number]  Date of birth: 05-20-1945  Office Visit Note: Visit Date: 03/09/2024 PCP: Norleen Lynwood ORN, MD Referred by: Burnetta Brunet, DO  Subjective: Chief Complaint  Patient presents with   Right Ankle - Pain   HPI: Antonio Reyes is a pleasant 78 y.o. male who presents today for acute on chronic right posterior ankle/achilles pain.  We did see Antonio Reyes back in early 2024 when he was having issues with his Achilles as well as his calcaneal spur.  He did undergo several treatments of extracorporeal shockwave therapy, home rehab/PT and other conservative treatments.  This completely resolved his pain until about the last 6-8 weeks when he feels like his pain has begun returning in the Achilles.  Does have pain near the superior calcaneal spur as well. He has a couple of trips coming up and would like to feel well to walk while he is away.   Did recently complete a very short course of prednisone  for sinus issue.  Pertinent ROS were reviewed with the patient and found to be negative unless otherwise specified above in HPI.   Assessment & Plan: Visit Diagnoses:  1. Achilles tendinosis of right lower extremity   2. Calcaneal spur of right foot   3. Chronic pain of right ankle   4. Contracture of right Achilles tendon    Plan: Impression is acute exacerbation of chronic Achilles and posterior heel/ankle pain which is twofold in nature.  He does have Achilles tendinosis in the mid distal substance.  He does have enthesophytes and a small Haglund deformity both which are currently exacerbated.  He did receive very good relief of his symptoms in the past with shockwave therapy.  We did repeat a trial of this today.  We also fit him for bilateral heel lifts, 7/16 inch to help offload the Achilles.  He does have a degree of dorsiflexion and Achilles contracture which is likely contributing.  We will start him back on a home exercise/PT regimen -we did print out a  customized handout for him today and my athletic trainer, Jinnie, did review these exercises with him in the room today.  He will perform this once daily.  Will continue and good supportive shoe wear.  I would like to see him back over the next few weeks to proceed with a few additional trials of shockwave therapy to see what sort of cumulative benefit he has going forward.  If or over-the-counter anti-inflammatories or topical Voltaren gel as needed.  Follow-up: Return for make 2 appts about 1-week apart in next 2-3 weeks (achilles).   Meds & Orders: No orders of the defined types were placed in this encounter.   Orders Placed This Encounter  Procedures   US  Extrem Low Right Ltd     Procedures: Procedure: ECSWT Indications:  Heel spur, achilles tendinopathy   Procedure Details Consent: Risks of procedure as well as the alternatives and risks of each were explained to the patient.  Verbal consent for procedure obtained. Time Out: Verified patient identification, verified procedure, site was marked, verified correct patient position. The area was cleaned with alcohol swab.     The right calcaneus and achilles tendon was targeted for Extracorporeal shockwave therapy.    Preset: Heel spur/tendinopathy Power Level: 100 mJ Frequency: 11 Hz  Impulse/cycles: 2400 Head size: Regular   Patient tolerated procedure well without immediate complications.        Clinical History: No specialty comments available.  He reports that he has been smoking cigars. He has never used smokeless tobacco.  Recent Labs    01/10/24 0938  HGBA1C 5.9    Objective:    Physical Exam  Gen: Well-appearing, in no acute distress; non-toxic CV: Well-perfused. Warm.  Resp: Breathing unlabored on room air; no wheezing. Psych: Fluid speech in conversation; appropriate affect; normal thought process  Ortho Exam - Right achilles/foot: + Mild thickness at the mid to distal aspect of the Achilles tendon.  There is  a small Haglund deformity noted with tenderness in this location.  No significant retrocalcaneal bursitis present.  There is a mild Achilles contracture with DF < 90 degrees.  Imaging: US  Extrem Low Right Ltd Result Date: 03/09/2024 Limited musculoskeletal ultrasound of the right posterior ankle and Achilles was performed today.  Evaluation shows mild thickening in the mid substance of the Achilles with associated tendinosis.  Mid substance Achilles thickness approximately 0.81 cm in nature compared to 0.7 cm of the contralateral Achilles.  There is evidence of distal enthesophyte, relatively large at 1.2cm near the calcaneus without intrasubstance Achilles calcific tendinitis.  No appreciable hyperemia noted.     *Independent review of the right foot x-ray from 04/24/2021 was reviewed and interpreted by myself.  2 views including AP and lateral were visualized.  AP view shows mild to moderate OA changes in the first MTP joint.  Lateral film shows large enthesophytes at the superior calcaneus, there is spur formation both at the insertion and separately within the tendon sheath.  Small Haglund deformity as well.   Past Medical/Family/Surgical/Social History: Medications & Allergies reviewed per EMR, new medications updated. Patient Active Problem List   Diagnosis Date Noted   Acute upper respiratory infection 11/07/2023   B12 deficiency 07/11/2023   Bilateral primary osteoarthritis of knee 03/23/2022   Achilles tendinitis 03/23/2022   Bilateral leg pain 05/20/2021   History of colonic polyps 05/20/2021   Acute sinusitis 02/19/2021   Lower abdominal pain 12/06/2020   Elevated coronary artery calcium  score 12/02/2020   Smoker 11/16/2020   Vitamin D  deficiency 05/09/2020   Laceration of digital nerve of left thumb 01/31/2020   Bilateral posterior neck pain 05/09/2018   Microhematuria 04/24/2018   CKD (chronic kidney disease) stage 3, GFR 30-59 ml/min (HCC) 04/24/2018   Dysuria 04/21/2018    Allergic rhinitis 09/20/2017   Nonallopathic lesion of sacral region 08/04/2017   Nonallopathic lesion of lumbosacral region 08/04/2017   Nonallopathic lesion of thoracic region 08/04/2017   Bilateral foot pain 04/22/2017   Left leg pain 04/22/2017   Bilateral groin pain 04/22/2017   Metatarsalgia 11/11/2016   Hallux rigidus 06/11/2016   Contusion of left knee 06/11/2016   Hyperglycemia 04/21/2016   Chest pain 06/05/2015   Polycythemia, secondary 02/09/2013   Dizziness 06/08/2011   Chronic sinusitis 06/08/2011   Ankle pain 08/18/2010   Morbid obesity (HCC) 04/20/2010   CHEST PAIN 07/22/2009   Genital herpes 04/12/2008   Anxiety state 04/12/2008   Migraine without aura 04/12/2008   BPH associated with nocturia 04/12/2008   History of colonic polyps 04/12/2008   LOW BACK PAIN 11/26/2006   HYPERCHOLESTEROLEMIA 11/17/2006   ERECTILE DYSFUNCTION 11/17/2006   Essential hypertension 11/17/2006   GERD 11/17/2006   OSA (obstructive sleep apnea) 11/17/2006   DIVERTICULITIS, HX OF 11/17/2006   Past Medical History:  Diagnosis Date   Abdominal hernia    Allergic rhinitis    Anxiety    pt states no history of anxiety   Aortic atherosclerosis 04/25/2018  noted on CT renal   Benign neoplasm of stomach    BPH (benign prostatic hypertrophy)    pt unaware   C6 cervical fracture (HCC)    C7,C8   Cancer (HCC) 2000   skin cancer   left ear   Chronic sinusitis    CKD (chronic kidney disease), stage III (HCC)    pt unaware   Diverticulitis    Diverticulosis 03/27/2015   Moderate, Noted on colonoscopy   ED (erectile dysfunction)    GERD (gastroesophageal reflux disease)    History of herpes genitalis    History of kidney stones 04/25/2018   4 mm left UVJ stone, 10 mm bladder stone, noted on CT renal   HLD (hyperlipidemia)    HTN (hypertension)    Hydronephrosis    Left, Moderate, noted on CT renal   Inguinal hernia 04/25/2018   Bilateral, noted on CT renal, pt unaware   Low  back pain    Migraine    hx of   Neck pain, bilateral    Obesity    Polycythemia 2000   per Dr. Jude   Rotator cuff tear    Sleep apnea    CPAP   Tubular adenoma of colon 05/2008   Family History  Problem Relation Age of Onset   Colon cancer Mother 88   Pancreatic cancer Paternal Grandfather    Colon polyps Neg Hx    Esophageal cancer Neg Hx    Stomach cancer Neg Hx    Rectal cancer Neg Hx    Past Surgical History:  Procedure Laterality Date   COLONOSCOPY  03/27/2015   CYSTOSCOPY WITH RETROGRADE PYELOGRAM, URETEROSCOPY AND STENT PLACEMENT Left 05/31/2018   Procedure: CYSTOSCOPY WITH RETROGRADE PYELOGRAM, URETEROSCOPY AND STENT PLACEMENT;  Surgeon: Alvaro Hummer, MD;  Location: [facility];  Service: Urology;  Laterality: Left;  1 HR   HERNIA REPAIR     umbilical with mesh   HOLMIUM LASER APPLICATION Left 05/31/2018   Procedure: HOLMIUM LASER APPLICATION;  Surgeon: Alvaro Hummer, MD;  Location: [facility];  Service: Urology;  Laterality: Left;   KNEE SURGERY Right    mensicus   2011   POLYPECTOMY     UPPER GI ENDOSCOPY  06/16/2005   viterectomy Right 11/2022   on Right eye   Social History   Occupational History   Occupation: airline pilot and marketing Surveyor, [employer])   Occupation: Retired  Tobacco Use   Smoking status: Light Smoker    Types: Cigars   Smokeless tobacco: Never   Tobacco comments:    occasionally  every 3 months  Vaping Use   Vaping status: Never Used  Substance and Sexual Activity   Alcohol use: Yes    Alcohol/week: 2.0 standard drinks of alcohol    Types: 1 Cans of beer, 1 Standard drinks or equivalent per week    Comment: rare   Drug use: No   Sexual activity: Yes   "

## 2024-03-09 NOTE — Progress Notes (Signed)
 Patient says that he did well with his achilles after the shockwave therapy until this fall. He says that his pain returned in the same location of his right achilles, and does feel the same as it has felt in the past. He would like to begin shockwave therapy again. He has a couple of trips coming up and would like to feel well to walk while he is away.  Patient was instructed in 10 minutes of therapeutic exercises for right achilles to improve strength, ROM and function according to my instructions and plan of care by a Certified Athletic Trainer during the office visit. A customized handout was provided and demonstration of proper technique shown and discussed. Patient did perform exercises and demonstrate understanding through teachback.  All questions discussed and answered.

## 2024-03-09 NOTE — Telephone Encounter (Unsigned)
 Copied from CRM #[id number]. Topic: Clinical - Medication Question >> Mar 09, 2024 10:22 AM Alexandria E wrote: Reason for CRM: Patient is leaving tomorrow to go on a cruise for one week and will run out of his augmentin . Patient does not need a full refill, just questioning if he could be prescribed an extra 5 days, and he would need to pick it up today. Please send to [facility] on [address]

## 2024-03-09 NOTE — Telephone Encounter (Signed)
 Oh no very sorry, but this should not be needed if he has competed the course prescribed.   Remember some congestion can still persist, but if no fever, worsening pain or swelling it is unlikely that he would need further antibiotic

## 2024-03-26 ENCOUNTER — Encounter: Payer: Self-pay | Admitting: Sports Medicine

## 2024-03-26 ENCOUNTER — Ambulatory Visit (INDEPENDENT_AMBULATORY_CARE_PROVIDER_SITE_OTHER): Admitting: Sports Medicine

## 2024-03-26 DIAGNOSIS — M7731 Calcaneal spur, right foot: Secondary | ICD-10-CM

## 2024-03-26 DIAGNOSIS — G8929 Other chronic pain: Secondary | ICD-10-CM | POA: Diagnosis not present

## 2024-03-26 DIAGNOSIS — M25571 Pain in right ankle and joints of right foot: Secondary | ICD-10-CM | POA: Diagnosis not present

## 2024-03-26 DIAGNOSIS — M6788 Other specified disorders of synovium and tendon, other site: Secondary | ICD-10-CM

## 2024-03-26 NOTE — Progress Notes (Signed)
 Patient says that he did very well after the first shockwave treatment, and did not notice the achilles at all until a couple of days ago. He denies any significant soreness or tenderness after the first treatment. He is here for repeat treatment today, and does have another appointment scheduled for next week.

## 2024-03-26 NOTE — Progress Notes (Signed)
 "  Antonio Reyes - 79 y.o. male MRN [id number]  Date of birth: 04-22-1945  Office Visit Note: Visit Date: 03/26/2024 PCP: Norleen Lynwood ORN, MD Referred by: Norleen Lynwood ORN, MD  Subjective: Chief Complaint  Patient presents with   Right Ankle - Follow-up   HPI: Antonio Reyes is a pleasant 79 y.o. male who presents today for follow-up of chronic right calcaneus/Achilles pain.  Has known Achilles midsubstance tendinosis as well as enthesophytes with a small Haglund deformity. Antonio Reyes states that he did very well after the first shockwave treatment, and did not notice the achilles at all until a couple of days ago. He denies any significant soreness or tenderness after the first treatment.  We did give him some home exercises to work on stretching the Achilles and strengthening/stabilization.  He would like to meet forward with an additional ESWT trial today.  Pertinent ROS were reviewed with the patient and found to be negative unless otherwise specified above in HPI.   Assessment & Plan: Visit Diagnoses:  1. Achilles tendinosis of right lower extremity   2. Calcaneal spur of right foot   3. Chronic pain of right ankle    Plan: Impression is improving chronic Achilles tendinosis posterior calcaneal spur/Haglund's after first trial of extracorporeal shockwave therapy.  We did repeat a second trial today, patient tolerated well.  Will continue with his heel lift to help offload the Achilles as patient feels he needs.  Would like him to continue his HEP to work on Achilles mobility and posterior lower leg chain stabilization.  He will present for follow-up and reevaluation with consideration of 1 additional ESWT trial, if he feels he needs additional 1 we will move them forward with shockwave only visits.  Did discuss the use of topical Voltaren gel needed. F/u next week.  Meds & Orders: No orders of the defined types were placed in this encounter.  No orders of the defined types were placed in this  encounter.    Procedures: Procedure: ECSWT Indications:  Heel spur, achilles tendinopathy   Procedure Details Consent: Risks of procedure as well as the alternatives and risks of each were explained to the patient.  Verbal consent for procedure obtained. Time Out: Verified patient identification, verified procedure, site was marked, verified correct patient position. The area was cleaned with alcohol swab.     The right calcaneus and achilles tendon was targeted for Extracorporeal shockwave therapy.    Preset: Heel spur/tendinopathy Power Level: 100 - 110 mJ Frequency: 10 (calc) - 12 (achilles) Hz  Impulse/cycles: 2500 Head size: Regular   Patient tolerated procedure well without immediate complications.      Clinical History: No specialty comments available.  He reports that he has been smoking cigars. He has never used smokeless tobacco.  Recent Labs    01/10/24 0938  HGBA1C 5.9    Objective:    Physical Exam  Gen: Well-appearing, in no acute distress; non-toxic CV: Well-perfused. Warm.  Resp: Breathing unlabored on room air; no wheezing. Psych: Fluid speech in conversation; appropriate affect; normal thought process  Ortho Exam - Right ankle/foot: Mild thickness at the mid and distal aspect of the Achilles tendon, small Haglund deformity noted.  No overlying swelling or effusion about the ankle joint.  Ankle dorsiflexion to approximately 95 degrees.  Imaging: No results found.  Past Medical/Family/Surgical/Social History: Medications & Allergies reviewed per EMR, new medications updated. Patient Active Problem List   Diagnosis Date Noted   Acute upper respiratory infection 11/07/2023  B12 deficiency 07/11/2023   Bilateral primary osteoarthritis of knee 03/23/2022   Achilles tendinitis 03/23/2022   Bilateral leg pain 05/20/2021   History of colonic polyps 05/20/2021   Acute sinusitis 02/19/2021   Lower abdominal pain 12/06/2020   Elevated coronary artery  calcium  score 12/02/2020   Smoker 11/16/2020   Vitamin D  deficiency 05/09/2020   Laceration of digital nerve of left thumb 01/31/2020   Bilateral posterior neck pain 05/09/2018   Microhematuria 04/24/2018   CKD (chronic kidney disease) stage 3, GFR 30-59 ml/min (HCC) 04/24/2018   Dysuria 04/21/2018   Allergic rhinitis 09/20/2017   Nonallopathic lesion of sacral region 08/04/2017   Nonallopathic lesion of lumbosacral region 08/04/2017   Nonallopathic lesion of thoracic region 08/04/2017   Bilateral foot pain 04/22/2017   Left leg pain 04/22/2017   Bilateral groin pain 04/22/2017   Metatarsalgia 11/11/2016   Hallux rigidus 06/11/2016   Contusion of left knee 06/11/2016   Hyperglycemia 04/21/2016   Chest pain 06/05/2015   Polycythemia, secondary 02/09/2013   Dizziness 06/08/2011   Chronic sinusitis 06/08/2011   Ankle pain 08/18/2010   Morbid obesity (HCC) 04/20/2010   CHEST PAIN 07/22/2009   Genital herpes 04/12/2008   Anxiety state 04/12/2008   Migraine without aura 04/12/2008   BPH associated with nocturia 04/12/2008   History of colonic polyps 04/12/2008   LOW BACK PAIN 11/26/2006   HYPERCHOLESTEROLEMIA 11/17/2006   ERECTILE DYSFUNCTION 11/17/2006   Essential hypertension 11/17/2006   GERD 11/17/2006   OSA (obstructive sleep apnea) 11/17/2006   DIVERTICULITIS, HX OF 11/17/2006   Past Medical History:  Diagnosis Date   Abdominal hernia    Allergic rhinitis    Anxiety    pt states no history of anxiety   Aortic atherosclerosis 04/25/2018   noted on CT renal   Benign neoplasm of stomach    BPH (benign prostatic hypertrophy)    pt unaware   C6 cervical fracture (HCC)    C7,C8   Cancer (HCC) 2000   skin cancer   left ear   Chronic sinusitis    CKD (chronic kidney disease), stage III (HCC)    pt unaware   Diverticulitis    Diverticulosis 03/27/2015   Moderate, Noted on colonoscopy   ED (erectile dysfunction)    GERD (gastroesophageal reflux disease)    History  of herpes genitalis    History of kidney stones 04/25/2018   4 mm left UVJ stone, 10 mm bladder stone, noted on CT renal   HLD (hyperlipidemia)    HTN (hypertension)    Hydronephrosis    Left, Moderate, noted on CT renal   Inguinal hernia 04/25/2018   Bilateral, noted on CT renal, pt unaware   Low back pain    Migraine    hx of   Neck pain, bilateral    Obesity    Polycythemia 2000   per Dr. Jude   Rotator cuff tear    Sleep apnea    CPAP   Tubular adenoma of colon 05/2008   Family History  Problem Relation Age of Onset   Colon cancer Mother 78   Pancreatic cancer Paternal Grandfather    Colon polyps Neg Hx    Esophageal cancer Neg Hx    Stomach cancer Neg Hx    Rectal cancer Neg Hx    Past Surgical History:  Procedure Laterality Date   COLONOSCOPY  03/27/2015   CYSTOSCOPY WITH RETROGRADE PYELOGRAM, URETEROSCOPY AND STENT PLACEMENT Left 05/31/2018   Procedure: CYSTOSCOPY WITH RETROGRADE PYELOGRAM, URETEROSCOPY AND  STENT PLACEMENT;  Surgeon: Alvaro Hummer, MD;  Location: [facility];  Service: Urology;  Laterality: Left;  1 HR   HERNIA REPAIR     umbilical with mesh   HOLMIUM LASER APPLICATION Left 05/31/2018   Procedure: HOLMIUM LASER APPLICATION;  Surgeon: Alvaro Hummer, MD;  Location: [facility];  Service: Urology;  Laterality: Left;   KNEE SURGERY Right    mensicus   2011   POLYPECTOMY     UPPER GI ENDOSCOPY  06/16/2005   viterectomy Right 11/2022   on Right eye   Social History   Occupational History   Occupation: airline pilot and marketing Surveyor, [employer])   Occupation: Retired  Tobacco Use   Smoking status: Light Smoker    Types: Cigars   Smokeless tobacco: Never   Tobacco comments:    occasionally  every 3 months  Vaping Use   Vaping status: Never Used  Substance and Sexual Activity   Alcohol use: Yes    Alcohol/week: 2.0 standard drinks of alcohol    Types: 1 Cans of beer, 1 Standard drinks or equivalent per week    Comment: rare   Drug use: No   Sexual  activity: Yes   "

## 2024-04-02 ENCOUNTER — Encounter: Payer: Self-pay | Admitting: Sports Medicine

## 2024-04-02 ENCOUNTER — Ambulatory Visit: Admitting: Sports Medicine

## 2024-04-02 DIAGNOSIS — G8929 Other chronic pain: Secondary | ICD-10-CM

## 2024-04-02 DIAGNOSIS — M7751 Other enthesopathy of right foot: Secondary | ICD-10-CM

## 2024-04-02 DIAGNOSIS — M6788 Other specified disorders of synovium and tendon, other site: Secondary | ICD-10-CM | POA: Diagnosis not present

## 2024-04-02 DIAGNOSIS — M7731 Calcaneal spur, right foot: Secondary | ICD-10-CM | POA: Diagnosis not present

## 2024-04-02 DIAGNOSIS — M25571 Pain in right ankle and joints of right foot: Secondary | ICD-10-CM

## 2024-04-02 NOTE — Progress Notes (Signed)
 Patient says that he is about 75-80% improved overall and is happy with his progress. He says that he has occasional discomfort. He is here for repeat shockwave treatment today. He is also asking about heel lifts/pads for his shoes.

## 2024-04-02 NOTE — Progress Notes (Signed)
 "  Antonio Reyes - 79 y.o. male MRN [id number]  Date of birth: [birth year]  Office Visit Note: Visit Date: 04/02/2024 PCP: Norleen Lynwood ORN, MD Referred by: Norleen Lynwood ORN, MD  Subjective: Chief Complaint  Patient presents with   Right Ankle - Follow-up   HPI: Antonio Reyes is a pleasant 79 y.o. male who presents today for follow-up  chronic right calcaneus/Achilles pain.   Has known Achilles midsubstance tendinosis as well as enthesophytes with a small Haglund deformity.  He has responded excellently to a trial of extracorporeal shockwave therapy, last on 03/26/2024.  Overall he feels he is at least 75-80% improved.  He has been doing some of the stretching and strengthening exercises we provided for him.  He is inquiring about an additional set of heel wedge placed in his other shoes.  Pain and swelling more so over the Aspect continues.  Pertinent ROS were reviewed with the patient and found to be negative unless otherwise specified above in HPI.   Assessment & Plan: Visit Diagnoses:  1. Achilles tendinosis of right lower extremity   2. Calcaneal spur of right foot   3. Chronic pain of right ankle   4. 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  This has responded very well to our first trials of extracorporeal shockwave therapy, we did repeat this again today.  He does seem to have a mild reciprocal retrocalcaneal bursitis today, we did discuss treating this with shockwave but also could consider a course of anti-inflammatories or even a one-time ultrasound-guided injection if not improving with the above.  He will continue his home exercises for Achilles mobility and posterior leg chain stabilization.  We did fit him for lateral heel lifts, 7/16 inch to help offload the Achilles today.  May use topical Voltaren gel as needed.  I would like him to see how he does over  the next 2 weeks, we will have him make an appointment for shockwave only visit at that time if he does not feel significantly improved.  If he is doing very well, he can follow-up as needed only.  Follow-up: Return in about 2 weeks (around 04/16/2024) for [facility] R-heel ($70).   Meds & Orders: No orders of the defined types were placed in this encounter.  No orders of the defined types were placed in this encounter.    Procedures: Procedure: ECSWT Indications:  Heel spur, achilles tendinopathy   Procedure Details Consent: Risks of procedure as well as the alternatives and risks of each were explained to the patient.  Verbal consent for procedure obtained. Time Out: Verified patient identification, verified procedure, site was marked, verified correct patient position. The area was cleaned with alcohol swab.     The right calcaneus and achilles tendon was targeted for Extracorporeal shockwave therapy.    Preset: Heel spur/tendinopathy Power Level: 100 - 110 mJ Frequency: 10 (calc) - 12 (achilles) Hz  Impulse/cycles: 2500 Head size: Regular   Patient tolerated procedure well without immediate complications.       Clinical History: No specialty comments available.  He reports that he has been smoking cigars. He has never used smokeless tobacco.  Recent Labs    01/10/24 0938  HGBA1C 5.9    Objective:    Physical Exam  Gen: Well-appearing, in no acute distress; non-toxic CV: Well-perfused. Warm.  Resp: Breathing unlabored on room air; no wheezing. Psych: Fluid  speech in conversation; appropriate affect; normal thought process  Ortho Exam - Right foot/heel: Thickening at the mid to distal aspect of the Achilles tendon.  Haglund deformity noted.  There does appear to be a mild retrocalcaneal bursitis present but no significant redness or cellulitis.  Imaging:  *Previous imaging: Limited musculoskeletal ultrasound of the right posterior ankle and  Achilles was performed  today.  Evaluation shows mild thickening in the mid  substance of the Achilles with associated tendinosis.  Mid substance  Achilles thickness approximately 0.81 cm in nature compared to 0.7 cm of  the contralateral Achilles.  There is evidence of distal enthesophyte,  relatively large at 1.2cm near the calcaneus without intrasubstance  Achilles calcific tendinitis.  No appreciable hyperemia noted.   Past Medical/Family/Surgical/Social History: Medications & Allergies reviewed per EMR, new medications updated. Patient Active Problem List   Diagnosis Date Noted   Acute upper respiratory infection 11/07/2023   B12 deficiency 07/11/2023   Bilateral primary osteoarthritis of knee 03/23/2022   Achilles tendinitis 03/23/2022   Bilateral leg pain 05/20/2021   History of colonic polyps 05/20/2021   Acute sinusitis 02/19/2021   Lower abdominal pain 12/06/2020   Elevated coronary artery calcium  score 12/02/2020   Smoker 11/16/2020   Vitamin D  deficiency 05/09/2020   Laceration of digital nerve of left thumb 01/31/2020   Bilateral posterior neck pain 05/09/2018   Microhematuria 04/24/2018   CKD (chronic kidney disease) stage 3, GFR 30-59 ml/min (HCC) 04/24/2018   Dysuria 04/21/2018   Allergic rhinitis 09/20/2017   Nonallopathic lesion of sacral region 08/04/2017   Nonallopathic lesion of lumbosacral region 08/04/2017   Nonallopathic lesion of thoracic region 08/04/2017   Bilateral foot pain 04/22/2017   Left leg pain 04/22/2017   Bilateral groin pain 04/22/2017   Metatarsalgia 11/11/2016   Hallux rigidus 06/11/2016   Contusion of left knee 06/11/2016   Hyperglycemia 04/21/2016   Chest pain 06/05/2015   Polycythemia, secondary 02/09/2013   Dizziness 06/08/2011   Chronic sinusitis 06/08/2011   Ankle pain 08/18/2010   Morbid obesity (HCC) 04/20/2010   CHEST PAIN 07/22/2009   Genital herpes 04/12/2008   Anxiety state 04/12/2008   Migraine without aura 04/12/2008   BPH associated with  nocturia 04/12/2008   History of colonic polyps 04/12/2008   LOW BACK PAIN 11/26/2006   HYPERCHOLESTEROLEMIA 11/17/2006   ERECTILE DYSFUNCTION 11/17/2006   Essential hypertension 11/17/2006   GERD 11/17/2006   OSA (obstructive sleep apnea) 11/17/2006   DIVERTICULITIS, HX OF 11/17/2006   Past Medical History:  Diagnosis Date   Abdominal hernia    Allergic rhinitis    Anxiety    pt states no history of anxiety   Aortic atherosclerosis 04/25/2018   noted on CT renal   Benign neoplasm of stomach    BPH (benign prostatic hypertrophy)    pt unaware   C6 cervical fracture (HCC)    C7,C8   Cancer (HCC) 2000   skin cancer   left ear   Chronic sinusitis    CKD (chronic kidney disease), stage III (HCC)    pt unaware   Diverticulitis    Diverticulosis 03/27/2015   Moderate, Noted on colonoscopy   ED (erectile dysfunction)    GERD (gastroesophageal reflux disease)    History of herpes genitalis    History of kidney stones 04/25/2018   4 mm left UVJ stone, 10 mm bladder stone, noted on CT renal   HLD (hyperlipidemia)    HTN (hypertension)    Hydronephrosis    Left,  Moderate, noted on CT renal   Inguinal hernia 04/25/2018   Bilateral, noted on CT renal, pt unaware   Low back pain    Migraine    hx of   Neck pain, bilateral    Obesity    Polycythemia 2000   per Dr. Jude   Rotator cuff tear    Sleep apnea    CPAP   Tubular adenoma of colon 05/2008   Family History  Problem Relation Age of Onset   Colon cancer Mother 20   Pancreatic cancer Paternal Grandfather    Colon polyps Neg Hx    Esophageal cancer Neg Hx    Stomach cancer Neg Hx    Rectal cancer Neg Hx    Past Surgical History:  Procedure Laterality Date   COLONOSCOPY  03/27/2015   CYSTOSCOPY WITH RETROGRADE PYELOGRAM, URETEROSCOPY AND STENT PLACEMENT Left 05/31/2018   Procedure: CYSTOSCOPY WITH RETROGRADE PYELOGRAM, URETEROSCOPY AND STENT PLACEMENT;  Surgeon: Alvaro Hummer, MD;  Location: [facility];  Service:  Urology;  Laterality: Left;  1 HR   HERNIA REPAIR     umbilical with mesh   HOLMIUM LASER APPLICATION Left 05/31/2018   Procedure: HOLMIUM LASER APPLICATION;  Surgeon: Alvaro Hummer, MD;  Location: [facility];  Service: Urology;  Laterality: Left;   KNEE SURGERY Right    mensicus   2011   POLYPECTOMY     UPPER GI ENDOSCOPY  06/16/2005   viterectomy Right 11/2022   on Right eye   Social History   Occupational History   Occupation: airline pilot and marketing Surveyor, [employer])   Occupation: Retired  Tobacco Use   Smoking status: Light Smoker    Types: Cigars   Smokeless tobacco: Never   Tobacco comments:    occasionally  every 3 months  Vaping Use   Vaping status: Never Used  Substance and Sexual Activity   Alcohol use: Yes    Alcohol/week: 2.0 standard drinks of alcohol    Types: 1 Cans of beer, 1 Standard drinks or equivalent per week    Comment: rare   Drug use: No   Sexual activity: Yes   I spent 30 minutes in the care of the patient today including face-to-face time, preparation to see the patient, as well as review of previous foot x-rays, review of previous ultrasound, time spent for extracorporeal shockwave therapy, as well as discussion on additional treatment options including injection, PT/HEP, heel lifts and functional movement for the above diagnoses.   Lonell Sprang, DO Primary Care Sports Medicine Physician  [facility] - Orthopedics  This note was dictated using Dragon naturally speaking software and may contain errors in syntax, spelling, or content which have not been identified prior to signing this note.     "

## 2024-04-16 ENCOUNTER — Ambulatory Visit: Admitting: Sports Medicine

## 2024-04-23 ENCOUNTER — Ambulatory Visit: Admitting: Sports Medicine

## 2024-04-24 ENCOUNTER — Encounter (INDEPENDENT_AMBULATORY_CARE_PROVIDER_SITE_OTHER): Payer: Self-pay | Admitting: Otolaryngology

## 2024-04-24 ENCOUNTER — Ambulatory Visit (INDEPENDENT_AMBULATORY_CARE_PROVIDER_SITE_OTHER): Admitting: Otolaryngology

## 2024-04-24 VITALS — BP 148/78 | HR 53 | Temp 97.7°F | Ht 76.0 in | Wt 272.0 lb

## 2024-04-24 DIAGNOSIS — J342 Deviated nasal septum: Secondary | ICD-10-CM | POA: Diagnosis not present

## 2024-04-24 DIAGNOSIS — J0181 Other acute recurrent sinusitis: Secondary | ICD-10-CM | POA: Diagnosis not present

## 2024-04-24 DIAGNOSIS — F17290 Nicotine dependence, other tobacco product, uncomplicated: Secondary | ICD-10-CM | POA: Diagnosis not present

## 2024-04-24 DIAGNOSIS — R0981 Nasal congestion: Secondary | ICD-10-CM | POA: Diagnosis not present

## 2024-04-24 DIAGNOSIS — J3489 Other specified disorders of nose and nasal sinuses: Secondary | ICD-10-CM

## 2024-04-24 DIAGNOSIS — J343 Hypertrophy of nasal turbinates: Secondary | ICD-10-CM | POA: Diagnosis not present

## 2024-04-24 MED ORDER — AMOXICILLIN-POT CLAVULANATE 875-125 MG PO TABS: 1.0000 | ORAL_TABLET | Freq: Two times a day (BID) | ORAL | 0 refills | Status: AC

## 2024-04-24 MED ORDER — PREDNISONE 10 MG PO TABS: 10.0000 mg | ORAL_TABLET | Freq: Every day | ORAL | 0 refills | Status: AC

## 2024-04-24 MED ORDER — FLUTICASONE PROPIONATE 50 MCG/ACT NA SUSP: NASAL | 3 refills | Status: AC

## 2024-04-25 ENCOUNTER — Telehealth (INDEPENDENT_AMBULATORY_CARE_PROVIDER_SITE_OTHER): Payer: Self-pay

## 2024-04-25 NOTE — Addendum Note (Signed)
 Addended by: Iylah Dworkin on: 04/25/2024 03:12 PM   Modules accepted: Orders

## 2024-04-25 NOTE — Telephone Encounter (Signed)
 Called patient and let them know the CT Scan was ordered but that Dr. Tobie said to wait till tomorrow or even possibly the next day to get it scheduled. The patient understood and thanked me for getting back to him quickly.

## 2024-04-25 NOTE — Telephone Encounter (Signed)
 Patient called and left a voicemail at 2:34 PM 04/25/2024. Patient stated that Dr. Tobie was going to send in an order to [facility] for him to get a CT Scan. Patient called [facility] and they did not have any orders. Please advise. Patient asked for a call back as well once everything is sent over.

## 2024-04-30 ENCOUNTER — Ambulatory Visit: Admitting: Sports Medicine

## 2024-05-10 ENCOUNTER — Ambulatory Visit (HOSPITAL_BASED_OUTPATIENT_CLINIC_OR_DEPARTMENT_OTHER)

## 2024-05-22 ENCOUNTER — Ambulatory Visit (INDEPENDENT_AMBULATORY_CARE_PROVIDER_SITE_OTHER): Admitting: Otolaryngology

## 2024-07-10 ENCOUNTER — Ambulatory Visit: Admitting: Internal Medicine

## 2024-07-24 ENCOUNTER — Ambulatory Visit: Admitting: Internal Medicine

## 2025-02-04 ENCOUNTER — Ambulatory Visit
